# Patient Record
Sex: Female | Born: 1960 | Race: White | Hispanic: No | Smoking: Never smoker
Health system: Southern US, Community
[De-identification: ages and names within clinical notes are randomized; demographics above are authoritative.]

## PROBLEM LIST (undated history)

## (undated) DIAGNOSIS — J90 Pleural effusion, not elsewhere classified: Secondary | ICD-10-CM

## (undated) DIAGNOSIS — F329 Major depressive disorder, single episode, unspecified: Secondary | ICD-10-CM

## (undated) DIAGNOSIS — J45909 Unspecified asthma, uncomplicated: Secondary | ICD-10-CM

## (undated) DIAGNOSIS — F32A Depression, unspecified: Secondary | ICD-10-CM

## (undated) DIAGNOSIS — C801 Malignant (primary) neoplasm, unspecified: Secondary | ICD-10-CM

## (undated) DIAGNOSIS — J948 Other specified pleural conditions: Secondary | ICD-10-CM

## (undated) DIAGNOSIS — R0600 Dyspnea, unspecified: Secondary | ICD-10-CM

## (undated) HISTORY — DX: Pleural effusion, not elsewhere classified: J90

## (undated) HISTORY — DX: Other specified pleural conditions: J94.8

---

## 1982-08-26 HISTORY — PX: GUM SURGERY: SHX658

## 1998-10-25 ENCOUNTER — Other Ambulatory Visit: Admission: RE | Admit: 1998-10-25 | Discharge: 1998-10-25 | Payer: Self-pay | Admitting: Obstetrics and Gynecology

## 1998-10-26 ENCOUNTER — Other Ambulatory Visit: Admission: RE | Admit: 1998-10-26 | Discharge: 1998-10-26 | Payer: Self-pay | Admitting: Obstetrics and Gynecology

## 1999-05-16 ENCOUNTER — Other Ambulatory Visit: Admission: RE | Admit: 1999-05-16 | Discharge: 1999-05-16 | Payer: Self-pay | Admitting: Obstetrics and Gynecology

## 1999-10-02 ENCOUNTER — Other Ambulatory Visit: Admission: RE | Admit: 1999-10-02 | Discharge: 1999-10-02 | Payer: Self-pay | Admitting: Obstetrics and Gynecology

## 2000-10-29 ENCOUNTER — Other Ambulatory Visit: Admission: RE | Admit: 2000-10-29 | Discharge: 2000-10-29 | Payer: Self-pay | Admitting: Obstetrics and Gynecology

## 2001-12-08 ENCOUNTER — Other Ambulatory Visit: Admission: RE | Admit: 2001-12-08 | Discharge: 2001-12-08 | Payer: Self-pay | Admitting: Obstetrics and Gynecology

## 2002-12-27 ENCOUNTER — Other Ambulatory Visit: Admission: RE | Admit: 2002-12-27 | Discharge: 2002-12-27 | Payer: Self-pay | Admitting: Obstetrics and Gynecology

## 2004-02-13 ENCOUNTER — Other Ambulatory Visit: Admission: RE | Admit: 2004-02-13 | Discharge: 2004-02-13 | Payer: Self-pay | Admitting: Obstetrics and Gynecology

## 2005-03-07 ENCOUNTER — Other Ambulatory Visit: Admission: RE | Admit: 2005-03-07 | Discharge: 2005-03-07 | Payer: Self-pay | Admitting: Obstetrics and Gynecology

## 2005-07-09 ENCOUNTER — Ambulatory Visit: Payer: Self-pay | Admitting: Internal Medicine

## 2006-07-07 ENCOUNTER — Ambulatory Visit: Payer: Self-pay | Admitting: Internal Medicine

## 2008-01-08 ENCOUNTER — Ambulatory Visit: Payer: Self-pay | Admitting: Cardiology

## 2008-01-20 ENCOUNTER — Encounter: Payer: Self-pay | Admitting: Cardiology

## 2008-01-20 ENCOUNTER — Ambulatory Visit: Payer: Self-pay | Admitting: Cardiology

## 2008-01-20 ENCOUNTER — Ambulatory Visit: Payer: Self-pay

## 2008-02-05 ENCOUNTER — Ambulatory Visit: Payer: Self-pay | Admitting: Cardiology

## 2008-06-01 ENCOUNTER — Encounter: Admission: RE | Admit: 2008-06-01 | Discharge: 2008-06-01 | Payer: Self-pay | Admitting: Family Medicine

## 2011-01-08 NOTE — Assessment & Plan Note (Signed)
[facility]                            CARDIOLOGY OFFICE NOTE   Brenda Patel, Brenda Patel                      MRN:          [id number]  DATE:01/08/2008                            DOB:          1960-12-02    REFERRING PHYSICIAN:  Dorisann Patel, M.D.   PRIMARY CARE PHYSICIAN:  Brenda Patel, M.D.   REASON FOR PRESENTATION:  Patient with palpitation.   HISTORY OF PRESENT ILLNESS:  The patient is a pleasant 50 year old with  palpitations.  She reports that these have been occurring for 4 to 5  years but they were rare.  However, since February,  she has had these  with increasing frequency.  She has them 4 to 5 times per hour now.  She  notices them daily.  She describes skipped beats.  She does not describe  sustained tachy palpitations.  They are not associated with presyncope  or syncope; however, they are uncomfortable.  She does not notice them  when she is active.  She notices them more when she is at rest.  She has  had at least one episode at night that was quite frequent and kept her  up.  She cannot associate them with anything in particular.  She does  not have associated nausea, vomiting, or diaphoresis.  She does not have  any associated shortness of breath.  She denies any PND or orthopnea.  She does walk for exercise.  She did see Dr. Talmage Nap who has seen her for  hyperparathyroidism.  She said she had blood work done and this was  reported as normal, though I do not have a report of this.  She does  drink caffeine, about 3 caffeinated teas a day.   PAST MEDICAL HISTORY:  1. Hyperparathyroidism.  2. Osteopenia.   PAST SURGICAL HISTORY:  None.   ALLERGIES:  She is intolerant of CODEINE.   MEDICATIONS:  Allegra, Nefazodone, Kariva, fluticasone, magnesium.   SOCIAL HISTORY:  The patient is a Comptroller.  She is divorced.  She has  no children.  She does not smoke cigarettes.  She drinks alcohol  socially.   FAMILY HISTORY:   Noncontributory for early coronary artery disease,  sudden cardiac death, syncope, cardiomyopathy.   REVIEW OF SYSTEMS:  Positive for headaches, irritable bowel syndrome.  Negative for other systems.   PHYSICAL EXAMINATION:  GENERAL:  The patient is in no distress.  VITAL SIGNS:  Blood pressure 143/85, heart rate 100 and regular, weight  149 pounds, body mass index 24.  HEENT:  Eyes are unremarkable, pupils equal, round, and reactive to  light, fundi not visualized, oral mucosa unremarkable.  NECK:  No jugular venous distention at 45 degrees, carotid upstroke  brisk and symmetric, no bruits, no thyromegaly.  LYMPHATICS:  No cervical, axillary, or inguinal adenopathy.  LUNGS:  Clear to auscultation bilaterally.  BACK:  No costovertebral angle tenderness.  CHEST:  Unremarkable.  HEART:  PMI not displaced or sustained, S1 and S2 within normal limits  with no S3, no S4, no clicks,  no rubs,  no murmurs.  ABDOMEN:  Obese, positive bowel  sounds, normal in frequency and pitch,  no bruits, no rebound, no guarding, no midline pulsatile mass.  No  hepatomegaly, no splenomegaly.  SKIN:  No rashes, no nodules.  EXTREMITIES:  2+ pulses throughout, no edema, no cyanosis or clubbing.  NEURO:  Oriented to person, place, and time, cranial nerves II-XII  grossly intact, motor grossly intact.   EKG:  Sinus rhythm, rate 85, axis within normal limits, intervals within  normal limits, no acute ST-wave changes.   ASSESSMENT/PLAN:  1. Palpitations.  The patient has frequent palpitations.  They sound      like premature atrial or ventricular contractions rather than any      sustained tachy arrhythmia.  I am going to check with Dr. Talmage Nap as      I am sure a TSH was drawn.  We will also see if a magnesium and      BMET were drawn.  I am going to order an echocardiogram to make      sure she has a structurally normal heart.  She will have a 24-hour      monitor which will most likely pick up these  palpitations since      they are happening daily. The first therapy will be to quit      caffeine and I discussed this with her at length.  If this does not      work and depending on the rhythm that is diagnosed, I will most      likely prescribe a calcium channel blocker or a beta blocker.  2. Hypertension:  Blood pressure is slightly elevated today but she      says this is unusual.  We will check it the next time she is here      and she may need to get a blood pressure cuff to keep a home diary.  3. Followup:  I will see the patient back in a couple of weeks after      her echo and Holter monitor.     Rollene Rotunda, MD, [facility]  Electronically Signed    JH/MedQ  DD: 01/08/2008  DT: 01/08/2008  Job #: [id number]   cc:   Brenda Patel, M.D.  Brenda Patel, M.D.

## 2011-01-08 NOTE — Assessment & Plan Note (Signed)
[facility]                            CARDIOLOGY OFFICE NOTE   Brenda, Patel                      MRN:          [id number]  DATE:02/05/2008                            DOB:          1960/11/04    The primary is a Dr. Gerri Spore.   REASON FOR PRESENTATION:  Evaluate patient with palpitation.   HISTORY OF PRESENT ILLNESS:  The patient is a 50 year old white female  with a history of palpitations as extensively described in a Jan 08, 2008 note.  Since that time, she had labs demonstrating normal  electrolytes.  There was a Holter monitor demonstrating sinus rhythm,  occasional sinus tachycardia, rare PVCs and PACs.  Her echocardiogram  was normal.  She had a well-preserved ejection fraction and no valvular  abnormalities.  She reports that following this appointment she had  significant improvement in her palpitations.  She would occasionally get  some skipped beats.  She said things calmed down at work.  She now is  having a little more stress and a few more palpitations.  However, they  are not particularly problematic.  There is no presyncope or syncope.  She had no chest pain or shortness of breath.   PAST MEDICAL HISTORY:  Hyperparathyroidism, osteopenia.   ALLERGIES:  SHE IS INTOLERANT TO CODEINE.   MEDICATIONS:  1. Serzone 200 mg b.i.d.  2. Allegra.  3. Flonase.  4. Birth control pill.   REVIEW OF SYSTEMS:  As stated in HPI and otherwise negative for other  systems.   PHYSICAL EXAMINATION:  The patient is in no distress.  Blood pressure  128/82, rate 99 and regular, weight 148 pounds, body mass index 26.  HEENT:  Eye lids are unremarkable; pupils equal, round and reactive to  light; fundi not visualized.  NECK:  No jugular venous distention at 45 degrees; carotid upstroke  brisk and symmetrical.  No thyromegaly.  LUNGS:  Clear to auscultation bilaterally.  HEART:  PMI not displaced or sustained, S1-S2 within normals, no S3,  no  S4, no clicks, no rubs, no murmurs.  ABDOMEN:  Flat, positive bowel sounds, normal in frequency and pitch, no  bruits, no rebound, no guarding, no midline pulsatile mass, no  organomegaly.  EXTREMITIES:  2+ pulse, no edema.   ASSESSMENT/PLAN:  Palpitations.  Patient is no longer bothered by these  as much.  We have done the workup to include labs and the echo and the  event monitor.  Without any has significant findings, I suspect that  this is related to the [facility] and PVCs we noted.  She is going to treat  these symptomatically.  For now, she does not want a calcium blocker or  beta blocker but will let me know if they get worse.  No further  cardiovascular testing is suggested.   FOLLOW-UP:  She can come back to this clinic as needed.     Rollene Rotunda, MD, [facility]  Electronically Signed    JH/MedQ  DD: 02/05/2008  DT: 02/05/2008  Job #: [id number]   cc:   Otilio Connors. Gerri Spore, M.D.

## 2011-01-11 NOTE — Assessment & Plan Note (Signed)
[facility]                               PULMONARY OFFICE NOTE   DARLINA, MCCAUGHEY                      MRN:          [id number]  DATE:07/07/2006                            DOB:          1960/12/07    PULMONARY/ALLERGY FOLLOWUP   PROBLEM:  1. Allergic rhinitis.  2. Asthma.  3. Allergic conjunctivitis.   HISTORY:  One year follow-up. At times, she feels a little over dried and is  using a saline nasal gel. Very rare need for albuterol. There has been  nothing purulent and no significant seasonal flares. 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 She had stopped allergy vaccines successfully some years ago.   MEDICATIONS:  1. Serzone 200 mg b.i.d.  2. Allegra D 60 mg b.i.d. p.r.n.  3. Flonase.  4. Mircette.  5. Albuterol rescue inhaler.  6. Cromolyn 4% eye drops.   DRUG INTOLERANCES:  CODEINE.   OBJECTIVE:  Weight: 145 pounds.  Blood pressure: 120/80.  Pulse: Regular,  82.  Room air saturation: 100%.  There is fairly minor erythema and watery  mucus in the nares bilaterally.  Conjunctivae are clear. Oropharynx is  clear. Lung sounds are normal. Heart sounds are regular without murmur, rub  or gallop.   IMPRESSION:  Allergic rhinitis, adequately controlled. No recent asthma or  conjunctivitis symptoms.   PLAN:  She will follow with her primary physician for routine refills. I  will be happy to see her again if needed.     Clinton D. Maple Hudson, MD, [facility], FACP  Electronically Signed    CDY/MedQ  DD: 07/07/2006  DT: 07/07/2006  Job #: [id number]   cc:   Otilio Connors. Gerri Spore, M.D.

## 2014-05-05 ENCOUNTER — Ambulatory Visit (INDEPENDENT_AMBULATORY_CARE_PROVIDER_SITE_OTHER): Payer: BC Managed Care – PPO | Admitting: Licensed Clinical Social Worker

## 2014-05-05 DIAGNOSIS — F331 Major depressive disorder, recurrent, moderate: Secondary | ICD-10-CM

## 2014-05-26 ENCOUNTER — Ambulatory Visit (INDEPENDENT_AMBULATORY_CARE_PROVIDER_SITE_OTHER): Payer: BC Managed Care – PPO | Admitting: Licensed Clinical Social Worker

## 2014-05-26 DIAGNOSIS — F332 Major depressive disorder, recurrent severe without psychotic features: Secondary | ICD-10-CM

## 2014-06-16 ENCOUNTER — Ambulatory Visit (INDEPENDENT_AMBULATORY_CARE_PROVIDER_SITE_OTHER): Payer: BC Managed Care – PPO | Admitting: Licensed Clinical Social Worker

## 2014-06-16 DIAGNOSIS — F332 Major depressive disorder, recurrent severe without psychotic features: Secondary | ICD-10-CM

## 2014-06-23 ENCOUNTER — Ambulatory Visit (INDEPENDENT_AMBULATORY_CARE_PROVIDER_SITE_OTHER): Payer: BC Managed Care – PPO | Admitting: Licensed Clinical Social Worker

## 2014-06-23 DIAGNOSIS — F332 Major depressive disorder, recurrent severe without psychotic features: Secondary | ICD-10-CM

## 2014-07-07 ENCOUNTER — Ambulatory Visit (INDEPENDENT_AMBULATORY_CARE_PROVIDER_SITE_OTHER): Payer: BC Managed Care – PPO | Admitting: Licensed Clinical Social Worker

## 2014-07-07 DIAGNOSIS — F332 Major depressive disorder, recurrent severe without psychotic features: Secondary | ICD-10-CM

## 2015-08-27 HISTORY — PX: COLONOSCOPY: SHX174

## 2015-10-06 ENCOUNTER — Other Ambulatory Visit (HOSPITAL_COMMUNITY)
Admission: RE | Admit: 2015-10-06 | Discharge: 2015-10-06 | Disposition: A | Discharge status: Home / Self Care | Payer: BC Managed Care – PPO | Source: Ambulatory Visit | Attending: Family Medicine | Admitting: Family Medicine

## 2015-10-06 ENCOUNTER — Other Ambulatory Visit: Payer: Self-pay | Admitting: Family Medicine

## 2015-10-06 DIAGNOSIS — Z1151 Encounter for screening for human papillomavirus (HPV): Secondary | ICD-10-CM | POA: Diagnosis not present

## 2015-10-06 DIAGNOSIS — Z01411 Encounter for gynecological examination (general) (routine) with abnormal findings: Secondary | ICD-10-CM | POA: Insufficient documentation

## 2015-10-09 LAB — CYTOLOGY - PAP

## 2016-01-01 ENCOUNTER — Other Ambulatory Visit: Payer: Self-pay | Admitting: Gastroenterology

## 2016-10-23 ENCOUNTER — Telehealth (HOSPITAL_COMMUNITY): Payer: Self-pay | Admitting: Family Medicine

## 2016-10-24 ENCOUNTER — Other Ambulatory Visit: Payer: Self-pay | Admitting: Family Medicine

## 2016-10-24 DIAGNOSIS — Q796 Ehlers-Danlos syndrome, unspecified: Secondary | ICD-10-CM

## 2016-10-29 ENCOUNTER — Other Ambulatory Visit: Payer: Self-pay | Admitting: Family Medicine

## 2016-10-29 DIAGNOSIS — R002 Palpitations: Secondary | ICD-10-CM

## 2016-10-29 DIAGNOSIS — Q796 Ehlers-Danlos syndrome, unspecified: Secondary | ICD-10-CM

## 2016-11-05 ENCOUNTER — Other Ambulatory Visit: Payer: BC Managed Care – PPO

## 2016-11-06 NOTE — Telephone Encounter (Signed)
  10/23/2016 11:08 AM Phone (Outgoing) Brenda, Maultsby Patel (Self) [phone] (M)   Left Message - Phone went straight to VM .Marland Kitchenlmsg for her to CB..to schedule echo    By Verdene Rio    10/25/2016 10:40 AM Phone ([address] Brenda, Patel (Self) [phone] (M)   Left Message - Called pt and lmsg for her to CB..    By Verdene Rio

## 2016-11-08 ENCOUNTER — Ambulatory Visit
Admission: RE | Admit: 2016-11-08 | Discharge: 2016-11-08 | Disposition: A | Discharge status: Home / Self Care | Payer: BC Managed Care – PPO | Source: Ambulatory Visit | Attending: Family Medicine | Admitting: Family Medicine

## 2016-11-08 DIAGNOSIS — Q796 Ehlers-Danlos syndrome, unspecified: Secondary | ICD-10-CM

## 2016-11-12 ENCOUNTER — Ambulatory Visit (HOSPITAL_COMMUNITY): Payer: BC Managed Care – PPO | Attending: Cardiology

## 2016-11-12 ENCOUNTER — Encounter (INDEPENDENT_AMBULATORY_CARE_PROVIDER_SITE_OTHER): Payer: Self-pay

## 2016-11-12 ENCOUNTER — Other Ambulatory Visit: Payer: Self-pay

## 2016-11-12 DIAGNOSIS — R002 Palpitations: Secondary | ICD-10-CM

## 2016-11-12 DIAGNOSIS — Q796 Ehlers-Danlos syndrome, unspecified: Secondary | ICD-10-CM

## 2018-06-26 DIAGNOSIS — C801 Malignant (primary) neoplasm, unspecified: Secondary | ICD-10-CM

## 2018-06-26 HISTORY — DX: Malignant (primary) neoplasm, unspecified: C80.1

## 2018-07-03 ENCOUNTER — Other Ambulatory Visit: Payer: Self-pay

## 2018-07-03 ENCOUNTER — Emergency Department (HOSPITAL_COMMUNITY): Payer: BC Managed Care – PPO

## 2018-07-03 ENCOUNTER — Encounter (HOSPITAL_COMMUNITY): Payer: Self-pay | Admitting: Internal Medicine

## 2018-07-03 ENCOUNTER — Other Ambulatory Visit: Payer: Self-pay | Admitting: Family Medicine

## 2018-07-03 ENCOUNTER — Observation Stay (HOSPITAL_COMMUNITY)
Admission: EM | Admit: 2018-07-03 | Discharge: 2018-07-04 | Disposition: A | Discharge status: Home / Self Care | Payer: BC Managed Care – PPO | Attending: Internal Medicine | Admitting: Internal Medicine

## 2018-07-03 ENCOUNTER — Ambulatory Visit
Admission: RE | Admit: 2018-07-03 | Discharge: 2018-07-03 | Disposition: A | Discharge status: Home / Self Care | Payer: BC Managed Care – PPO | Source: Ambulatory Visit | Attending: Family Medicine | Admitting: Family Medicine

## 2018-07-03 DIAGNOSIS — J45909 Unspecified asthma, uncomplicated: Secondary | ICD-10-CM | POA: Diagnosis not present

## 2018-07-03 DIAGNOSIS — J302 Other seasonal allergic rhinitis: Secondary | ICD-10-CM

## 2018-07-03 DIAGNOSIS — R222 Localized swelling, mass and lump, trunk: Secondary | ICD-10-CM | POA: Diagnosis present

## 2018-07-03 DIAGNOSIS — D649 Anemia, unspecified: Secondary | ICD-10-CM

## 2018-07-03 DIAGNOSIS — Z7951 Long term (current) use of inhaled steroids: Secondary | ICD-10-CM | POA: Diagnosis not present

## 2018-07-03 DIAGNOSIS — R7989 Other specified abnormal findings of blood chemistry: Secondary | ICD-10-CM | POA: Diagnosis not present

## 2018-07-03 DIAGNOSIS — R000 Tachycardia, unspecified: Secondary | ICD-10-CM | POA: Insufficient documentation

## 2018-07-03 DIAGNOSIS — J948 Other specified pleural conditions: Secondary | ICD-10-CM | POA: Insufficient documentation

## 2018-07-03 DIAGNOSIS — F329 Major depressive disorder, single episode, unspecified: Secondary | ICD-10-CM | POA: Diagnosis not present

## 2018-07-03 DIAGNOSIS — Z79899 Other long term (current) drug therapy: Secondary | ICD-10-CM | POA: Diagnosis not present

## 2018-07-03 DIAGNOSIS — D509 Iron deficiency anemia, unspecified: Secondary | ICD-10-CM | POA: Diagnosis not present

## 2018-07-03 DIAGNOSIS — J90 Pleural effusion, not elsewhere classified: Secondary | ICD-10-CM | POA: Diagnosis present

## 2018-07-03 DIAGNOSIS — R918 Other nonspecific abnormal finding of lung field: Secondary | ICD-10-CM | POA: Diagnosis not present

## 2018-07-03 DIAGNOSIS — D75839 Thrombocytosis, unspecified: Secondary | ICD-10-CM

## 2018-07-03 DIAGNOSIS — D473 Essential (hemorrhagic) thrombocythemia: Secondary | ICD-10-CM

## 2018-07-03 DIAGNOSIS — Z885 Allergy status to narcotic agent status: Secondary | ICD-10-CM | POA: Insufficient documentation

## 2018-07-03 DIAGNOSIS — R0602 Shortness of breath: Secondary | ICD-10-CM

## 2018-07-03 HISTORY — PX: IR THORACENTESIS ASP PLEURAL SPACE W/IMG GUIDE: IMG5380

## 2018-07-03 HISTORY — DX: Major depressive disorder, single episode, unspecified: F32.9

## 2018-07-03 HISTORY — DX: Depression, unspecified: F32.A

## 2018-07-03 LAB — GLUCOSE, PLEURAL OR PERITONEAL FLUID: GLUCOSE FL: 109 mg/dL

## 2018-07-03 LAB — CBC WITH DIFFERENTIAL/PLATELET
Abs Immature Granulocytes: 0.04 10*3/uL (ref 0.00–0.07)
Basophils Absolute: 0.1 10*3/uL (ref 0.0–0.1)
Basophils Relative: 0 %
Eosinophils Absolute: 0.1 10*3/uL (ref 0.0–0.5)
Eosinophils Relative: 0 %
HCT: 37.4 % (ref 36.0–46.0)
Hemoglobin: 11 g/dL — ABNORMAL LOW (ref 12.0–15.0)
Immature Granulocytes: 0 %
Lymphocytes Relative: 8 %
Lymphs Abs: 1.2 10*3/uL (ref 0.7–4.0)
MCH: 24.9 pg — ABNORMAL LOW (ref 26.0–34.0)
MCHC: 29.4 g/dL — ABNORMAL LOW (ref 30.0–36.0)
MCV: 84.8 fL (ref 80.0–100.0)
Monocytes Absolute: 0.7 10*3/uL (ref 0.1–1.0)
Monocytes Relative: 5 %
Neutro Abs: 12.4 10*3/uL — ABNORMAL HIGH (ref 1.7–7.7)
Neutrophils Relative %: 87 %
Platelets: 546 10*3/uL — ABNORMAL HIGH (ref 150–400)
RBC: 4.41 MIL/uL (ref 3.87–5.11)
RDW: 13.9 % (ref 11.5–15.5)
WBC: 14.5 10*3/uL — ABNORMAL HIGH (ref 4.0–10.5)
nRBC: 0 % (ref 0.0–0.2)

## 2018-07-03 LAB — BODY FLUID CELL COUNT WITH DIFFERENTIAL
Eos, Fluid: 1 %
Lymphs, Fluid: 27 %
Monocyte-Macrophage-Serous Fluid: 68 % (ref 50–90)
Neutrophil Count, Fluid: 4 % (ref 0–25)
Total Nucleated Cell Count, Fluid: 350 cu mm (ref 0–1000)

## 2018-07-03 LAB — GRAM STAIN

## 2018-07-03 LAB — LACTATE DEHYDROGENASE: LDH: 200 U/L — ABNORMAL HIGH (ref 98–192)

## 2018-07-03 LAB — BASIC METABOLIC PANEL
Anion gap: 9 (ref 5–15)
BUN: 10 mg/dL (ref 6–20)
CO2: 27 mmol/L (ref 22–32)
Calcium: 9 mg/dL (ref 8.9–10.3)
Chloride: 98 mmol/L (ref 98–111)
Creatinine, Ser: 0.71 mg/dL (ref 0.44–1.00)
GFR calc Af Amer: >60 mL/min (ref >60)
GFR calc non Af Amer: >60 mL/min (ref >60)
Glucose, Bld: 108 mg/dL — ABNORMAL HIGH (ref 70–99)
Potassium: 3.8 mmol/L (ref 3.5–5.1)
Sodium: 134 mmol/L — ABNORMAL LOW (ref 135–145)

## 2018-07-03 LAB — HEPATIC FUNCTION PANEL
ALBUMIN: 3.2 g/dL — AB (ref 3.5–5.0)
ALT: 18 U/L (ref 0–44)
AST: 27 U/L (ref 15–41)
Alkaline Phosphatase: 105 U/L (ref 38–126)
Bilirubin, Direct: <0.1 mg/dL (ref 0.0–0.2)
TOTAL PROTEIN: 7.4 g/dL (ref 6.5–8.1)
Total Bilirubin: 0.4 mg/dL (ref 0.3–1.2)

## 2018-07-03 LAB — PROTEIN, PLEURAL OR PERITONEAL FLUID: Total protein, fluid: 4.7 g/dL

## 2018-07-03 LAB — LACTATE DEHYDROGENASE, PLEURAL OR PERITONEAL FLUID: LD, Fluid: 234 U/L — ABNORMAL HIGH (ref 3–23)

## 2018-07-03 MED ORDER — PSEUDOEPHEDRINE HCL ER 120 MG PO TB12
120.0000 mg | ORAL_TABLET | Freq: Two times a day (BID) | ORAL | Status: DC
  Administered 2018-07-03: 120 mg via ORAL
  Filled 2018-07-03 (×2): qty 1

## 2018-07-03 MED ORDER — FLUTICASONE PROPIONATE 50 MCG/ACT NA SUSP
1.0000 | Freq: Every day | NASAL | Status: DC | PRN
  Filled 2018-07-03: qty 16

## 2018-07-03 MED ORDER — MONTELUKAST SODIUM 10 MG PO TABS
10.0000 mg | ORAL_TABLET | Freq: Every day | ORAL | Status: DC
  Administered 2018-07-03: 10 mg via ORAL
  Filled 2018-07-03: qty 1

## 2018-07-03 MED ORDER — BENZONATATE 100 MG PO CAPS: 200.0000 mg | ORAL_CAPSULE | Freq: Three times a day (TID) | ORAL | Status: DC | PRN

## 2018-07-03 MED ORDER — LIDOCAINE HCL 1 % IJ SOLN
INTRAMUSCULAR | Status: AC
  Filled 2018-07-03: qty 20

## 2018-07-03 MED ORDER — ACETAMINOPHEN 325 MG PO TABS: 650.0000 mg | ORAL_TABLET | Freq: Four times a day (QID) | ORAL | Status: DC | PRN

## 2018-07-03 MED ORDER — IOHEXOL 300 MG/ML  SOLN
75.0000 mL | Freq: Once | INTRAMUSCULAR | Status: AC | PRN
  Administered 2018-07-03: 75 mL via INTRAVENOUS

## 2018-07-03 MED ORDER — NEFAZODONE HCL 100 MG PO TABS
100.0000 mg | ORAL_TABLET | Freq: Two times a day (BID) | ORAL | Status: DC
  Administered 2018-07-03 – 2018-07-04 (×2): 100 mg via ORAL
  Filled 2018-07-03 (×2): qty 1

## 2018-07-03 MED ORDER — HEPARIN SODIUM (PORCINE) 5000 UNIT/ML IJ SOLN
5000.0000 [IU] | Freq: Three times a day (TID) | INTRAMUSCULAR | Status: DC
  Administered 2018-07-03 – 2018-07-04 (×2): 5000 [IU] via SUBCUTANEOUS
  Filled 2018-07-03 (×2): qty 1

## 2018-07-03 MED ORDER — LORATADINE 10 MG PO TABS
10.0000 mg | ORAL_TABLET | Freq: Every day | ORAL | Status: DC
  Administered 2018-07-04: 10 mg via ORAL
  Filled 2018-07-03: qty 1

## 2018-07-03 MED ORDER — CALCIUM CITRATE 950 (200 CA) MG PO TABS
950.0000 mg | ORAL_TABLET | Freq: Every day | ORAL | Status: DC
  Administered 2018-07-04: 950 mg via ORAL
  Filled 2018-07-03: qty 1

## 2018-07-03 NOTE — ED Provider Notes (Signed)
4:47 PM handoff from [facility] PA-C at 4 PM.  Pending CT chest.  Patient has had thoracentesis with 1.5 L of cloudy drainage.  This was sent for testing.  Results are pending.  7:20 PM CT reviewed by myself.  Patient updated on results.  In the hallway, heart rate in the low 110s with oxygen level ranging between 92 to 94% on room air.  Patient will be admitted to the hospital for evaluation of thoracic mass.  Will attempt to speak with IR as well as hospitalist.   BP 138/77 (BP Location: Left Arm)   Pulse (!) 108   Temp 99.5 F (37.5 C) (Oral)   Resp 16   SpO2 95%   Patient is additional history of chronic cough that started approximately September.  She thought that she was having a reaction from eyedrops that she was using after several surgeries for cataracts.  She also reports a full sensation in early satiety that is been occurring for several months prior to this.  7:32 PM Spoke with Dr. Marlowe Sax who will admit.   Spoke with Dr. Geroge Baseman of IR. Plan is to await cytology result and determine if biopsy is needed.       Carlisle Cater, PA-C 07/03/18 1943    Valarie Merino, MD 07/04/18 [phone]

## 2018-07-03 NOTE — ED Provider Notes (Signed)
[place] EMERGENCY DEPARTMENT Provider Note   CSN: [id number] Arrival date & time: 07/03/18  1325     History   Chief Complaint Chief Complaint  Patient presents with  . Pleural Effusion    HPI GABRIELLAH RABEL is a 57 y.o. female with a hx of depression and seasonal allergies who presents to the ED at the request of her PCP due to pleural effusion identified on outpatient CXR earlier today.  Patient states that for the past 1.5 months she has been experiencing progressively worsening dry cough, dyspnea with activity, and generalized fatigue.  States that other than activity there are no specific alleviating or aggravating factors.  She was seen by an urgent care facility 5 days prior and started on azithromycin and Tessalon.  She has been taking this as prescribed without relief.  She called her primary care provider who ordered an x-ray-had this performed today and it revealed a right-sided pleural effusion.  She was redirected to the emergency department for further evaluation regarding this diagnosis.  Denies fever, chills, nausea, vomiting, chest pain, abdominal pain, leg pain, or leg swelling.  Denies recent foreign travel.  Denies history of tobacco abuse.  HPI  No past medical history on file.  There are no active problems to display for this patient.   Past Surgical History:  Procedure Laterality Date  . IR THORACENTESIS ASP PLEURAL SPACE W/IMG GUIDE  07/03/2018     OB History   None      Home Medications    Prior to Admission medications   Not on File    Family History No family history on file.  Social History Social History   Tobacco Use  . Smoking status: Not on file  Substance Use Topics  . Alcohol use: Not on file  . Drug use: Not on file     Allergies   Patient has no allergy information on record.   Review of Systems Review of Systems  Constitutional: Positive for fatigue. Negative for chills and fever.  Respiratory:  Positive for cough and shortness of breath. Negative for wheezing.   Cardiovascular: Negative for chest pain, palpitations and leg swelling.  Gastrointestinal: Negative for abdominal pain, nausea and vomiting.  All other systems reviewed and are negative.    Physical Exam Updated Vital Signs BP 138/84   Pulse (!) 108   Temp 99.5 F (37.5 C) (Oral)   Resp 17   SpO2 95%   Physical Exam  Constitutional: She appears well-developed and well-nourished.  Non-toxic appearance. No distress.  HENT:  Head: Normocephalic and atraumatic.  Eyes: Conjunctivae are normal. Right eye exhibits no discharge. Left eye exhibits no discharge.  Neck: Neck supple.  Cardiovascular: Regular rhythm. Tachycardia present.  Pulmonary/Chest: Effort normal. No respiratory distress. She has decreased breath sounds (mid to lower right lung field). She has no wheezes. She has no rhonchi. She has no rales.  Respiration even and unlabored  Abdominal: Soft. She exhibits no distension. There is no tenderness.  Musculoskeletal:       Right lower leg: She exhibits no tenderness and no edema.       Left lower leg: She exhibits no tenderness and no edema.  Neurological: She is alert.  Clear speech.   Skin: Skin is warm and dry. No rash noted.  Psychiatric: She has a normal mood and affect. Her behavior is normal.  Nursing note and vitals reviewed.   ED Treatments / Results  Labs (all labs ordered are listed,  but only abnormal results are displayed) Labs Reviewed - No data to display  EKG None  Radiology Dg Chest 2 View  Result Date: 07/03/2018 CLINICAL DATA:  Short of breath and cough for 2 weeks. History of asthma. EXAM: CHEST - 2 VIEW COMPARISON:  None. FINDINGS: A large pleural effusion occupies at least 60% of the right hemithorax. Cannot exclude underlying pneumonia. Possible irregular pleural thickening versus fluid at the right apex. Left lung is hyperexpanded but clear. No left pleural effusion. No  pneumothorax on either side. Cardiac silhouette is normal in size. No mediastinal or left hilar masses. Right hilum is obscured by the contiguous pleural effusion. Skeletal structures are intact. IMPRESSION: 1. Large right pleural effusion. There is a possible underlying pneumonia versus atelectasis. Cannot exclude an underlying mass. Recommend follow-up chest CT with contrast for further assessment. 2. No evidence of pulmonary edema. Electronically Signed   By: Lajean Manes M.D.   On: 07/03/2018 13:04    Procedures Procedures (including critical care time)  Medications Ordered in ED Medications - No data to display   Initial Impression / Assessment and Plan / ED Course  I have reviewed the triage vital signs and the nursing notes.  Pertinent labs & imaging results that were available during my care of the patient were reviewed by me and considered in my medical decision making (see chart for details).   Patient presents to the emergency department due to right-sided pleural effusion confirmed on outpatient chest x-ray today.  She is been experiencing cough, dyspnea, and generalized fatigue for the past 1.5 months.  On exam she is afebrile, mildly tachycardic, but without signs of respiratory distress.  She has decreased breath sounds to the mid and lower right lung consistent with confirmed pleural effusion.  Discussed with supervising physician Dr. Tomi Bamberger: We will obtain a CT chest with contrast per radiologist recommendations per x-ray interpretation, basic labs, and order IR guided thoracentesis.  Patient's labs thus far have been reviewed: She has leukocytosis of 14.5 with left shift.  Hemoglobin of 11.0, no prior record for comparison.  Thrombocytosis at 546, also no prior on record for comparison.  Mild hyponatremia at 134.  Otherwise unremarkable.  Interventional radiology team has performed thoracentesis, pleural fluid analysis pending as well as CT chest with contrast.  Patient signed  out to [facility] PA-C at change of shift pending these results.  Final Clinical Impressions(s) / ED Diagnoses   Final diagnoses:  Pleural effusion    ED Discharge Orders    None       Amaryllis Dyke, PA-C 07/03/18 1751    Dorie Rank, MD 07/04/18 [phone]

## 2018-07-03 NOTE — ED Triage Notes (Signed)
Pt here from [place] with positive chest x-ray for pleural effusion. Pt reports shortness of breath for about a month that has gotten worse since Monday. Patient went to urgent care and was prescribed a zpack and tesslon for cough. VSS at this time.

## 2018-07-03 NOTE — H&P (Addendum)
History and Physical    Brenda Patel TLX:[id number] DOB: 1961-07-03 DOA: 07/03/2018  PCP: Patient, No Pcp Per Patient coming from: PCPs office  Chief Complaint: Evaluation of pleural effusion  HPI: Brenda Patel is a 57 y.o. female with medical history significant of seasonal allergies, depression presenting to the hospital at the request of her PCP for evaluation of a pleural effusion identified on outpatient chest x-ray earlier today.  Patient reports having a 2-week history of dyspnea and dry cough.  States it has been very difficult for her to breathe when laying down flat.  Reports having a temperature of 99.5 F this morning.  She is a never smoker.  ED Course: Afebrile, tachycardic, satting well on room air.  White count 14.5.  Hemoglobin 11.0, platelet count 546,000.No prior labs in the chart for comparison.  Chest x-ray showing a large right-sided pleural effusion.  IR was consulted and patient underwent thoracentesis yielding 1.5 L of pleural fluid.  Follow-up chest x-ray revealed slightly decreased right pleural effusion and no pneumothorax.  Patient subsequently had a chest CT which revealed a very large (1360 cm) heterogenously enhancing right subpulmonic mass which is thought to be possibly pleural in origin. Differential diagnostic considerations include malignant mesothelioma, lung cancer with pleural spread, other metastatic disease, pleural lymphoma, and solitary fibrous tumor of the pleura.  Review of Systems: As per HPI otherwise 10 point review of systems negative.  Past Medical History:  Diagnosis Date  . Depression     Past Surgical History:  Procedure Laterality Date  . IR THORACENTESIS ASP PLEURAL SPACE W/IMG GUIDE  07/03/2018     has no tobacco, alcohol, and drug history on file.  Patient denies tobacco and illicit drug use.  States she drinks alcohol occasionally; last time she consumed wine was several weeks ago.  Allergies  Allergen Reactions  . Codeine  Nausea Only and Other (See Comments)    Severe constipation, also  . Other Other (See Comments)    Lima beans- Tested allergic    History reviewed. No pertinent family history.  Prior to Admission medications   Medication Sig Start Date End Date Taking? Authorizing Provider  acetaminophen (TYLENOL) 500 MG tablet Take 500-1,000 mg by mouth every 6 (six) hours as needed (for pain, discomfort, or fever).   Yes [provider]  albuterol (PROAIR HFA) 108 (90 Base) MCG/ACT inhaler Inhale 2 puffs into the lungs every 6 (six) hours as needed for wheezing or shortness of breath.   Yes [provider]  azithromycin (ZITHROMAX) 250 MG tablet Take 250-500 mg by mouth See admin instructions. Take 500 mg by mouth on day one, then 250 mg once daily for 4 days 06/29/18  Yes [provider]  benzonatate (TESSALON) 200 MG capsule Take 200 mg by mouth 3 (three) times daily as needed for cough.  06/29/18  Yes [provider]  Calcium Citrate 333 MG TABS Take 333 mg by mouth daily.   Yes [provider]  [employer] 12 HOUR NASAL DECONGESTANT 120 MG 12 hr tablet Take 120 mg by mouth 2 (two) times daily. 05/31/18  Yes [provider]  loratadine (CLARITIN) 10 MG tablet Take 10 mg by mouth daily.   Yes [provider]  meloxicam (MOBIC) 15 MG tablet Take 15 mg by mouth daily as needed for pain.  05/17/18  Yes [provider]  montelukast (SINGULAIR) 10 MG tablet Take 10 mg by mouth daily after supper. 06/07/18  Yes [provider]  nefazodone (SERZONE) 100 MG tablet Take 100 mg by mouth 2 (two) times daily. 06/07/18  Yes [provider]  traMADol (ULTRAM) 50 MG tablet Take 50-100 mg by mouth every 6 (six) hours as needed (for pain). 1-2 TABLET AS NEEDED EVERY 6 HRS AS NEEDED FOR PAIN ORALLY 30 DAYS 03/21/17  Yes [provider]  fluticasone (FLONASE) 50 MCG/ACT nasal spray Place 1 spray into both nostrils daily as needed for  allergies or rhinitis.  05/05/18   [provider]    Physical Exam: Vitals:   07/03/18 1545 07/03/18 1600 07/03/18 1615 07/03/18 1903  BP: 136/80 134/81 138/84 138/77  Pulse: (!) 107 (!) 105 (!) 108 (!) 108  Resp: 13 13 17 16   Temp:      TempSrc:      SpO2: 98% 97% 95% 95%    Physical Exam  Constitutional: She is oriented to person, place, and time. She appears well-developed and well-nourished. No distress.  Resting comfortably in a hospital stretcher  HENT:  Head: Normocephalic and atraumatic.  Mouth/Throat: Oropharynx is clear and moist.  Eyes: Right eye exhibits no discharge. Left eye exhibits no discharge.  Neck: Neck supple. No tracheal deviation present.  Cardiovascular: Regular rhythm and intact distal pulses.  Tachycardic  Pulmonary/Chest: Effort normal. She has no wheezes. She has no rales.  Decreased breath sounds at the right lung base  Abdominal: Soft. Bowel sounds are normal. She exhibits no distension. There is no tenderness.  Musculoskeletal: She exhibits no edema.  Neurological: She is alert and oriented to person, place, and time.  Skin: Skin is warm and dry. She is not diaphoretic.  Psychiatric: She has a normal mood and affect. Her behavior is normal.     Labs on Admission: I have personally reviewed following labs and imaging studies  CBC: Recent Labs  Lab 07/03/18 1339  WBC 14.5*  NEUTROABS 12.4*  HGB 11.0*  HCT 37.4  MCV 84.8  PLT 761*   Basic Metabolic Panel: Recent Labs  Lab 07/03/18 1339  NA 134*  K 3.8  CL 98  CO2 27  GLUCOSE 108*  BUN 10  CREATININE 0.71  CALCIUM 9.0   GFR: CrCl cannot be calculated (Unknown ideal weight.). Liver Function Tests: No results for input(s): AST, ALT, ALKPHOS, BILITOT, PROT, ALBUMIN in the last 168 hours. No results for input(s): LIPASE, AMYLASE in the last 168 hours. No results for input(s): AMMONIA in the last 168 hours. Coagulation Profile: No results for input(s): INR, PROTIME in  the last 168 hours. Cardiac Enzymes: No results for input(s): CKTOTAL, CKMB, CKMBINDEX, TROPONINI in the last 168 hours. BNP (last 3 results) No results for input(s): PROBNP in the last 8760 hours. HbA1C: No results for input(s): HGBA1C in the last 72 hours. CBG: No results for input(s): GLUCAP in the last 168 hours. Lipid Profile: No results for input(s): CHOL, HDL, LDLCALC, TRIG, CHOLHDL, LDLDIRECT in the last 72 hours. Thyroid Function Tests: No results for input(s): TSH, T4TOTAL, FREET4, T3FREE, THYROIDAB in the last 72 hours. Anemia Panel: No results for input(s): VITAMINB12, FOLATE, FERRITIN, TIBC, IRON, RETICCTPCT in the last 72 hours. Urine analysis: No results found for: COLORURINE, APPEARANCEUR, [place], [place], [place], [place], [place], [place], [place], [place], NITRITE, LEUKOCYTESUR  Radiological Exams on Admission: Dg Chest 1 View  Result Date: 07/03/2018 CLINICAL DATA:  Status post right-sided thoracentesis. EXAM: CHEST  1 VIEW COMPARISON:  Radiograph of same day. FINDINGS: The heart size and mediastinal contours are within normal limits. Left lung is clear.  Large right pleural effusion is noted which is slightly decreased compared to prior exam. No pneumothorax is noted. The visualized skeletal structures are unremarkable. IMPRESSION: Right pleural effusion is slightly decreased compared to prior exam. No pneumothorax is noted. Electronically Signed   By: Marijo Conception, M.D.   On: 07/03/2018 15:44   Dg Chest 2 View  Result Date: 07/03/2018 CLINICAL DATA:  Short of breath and cough for 2 weeks. History of asthma. EXAM: CHEST - 2 VIEW COMPARISON:  None. FINDINGS: A large pleural effusion occupies at least 60% of the right hemithorax. Cannot exclude underlying pneumonia. Possible irregular pleural thickening versus fluid at the right apex. Left lung is hyperexpanded but clear. No left pleural effusion. No pneumothorax on either side. Cardiac silhouette is normal  in size. No mediastinal or left hilar masses. Right hilum is obscured by the contiguous pleural effusion. Skeletal structures are intact. IMPRESSION: 1. Large right pleural effusion. There is a possible underlying pneumonia versus atelectasis. Cannot exclude an underlying mass. Recommend follow-up chest CT with contrast for further assessment. 2. No evidence of pulmonary edema. Electronically Signed   By: Lajean Manes M.D.   On: 07/03/2018 13:04   Ct Chest W Contrast  Result Date: 07/03/2018 CLINICAL DATA:  Dyspnea. Thoracentesis today. Right pleural effusion. EXAM: CT CHEST WITH CONTRAST TECHNIQUE: Multidetector CT imaging of the chest was performed during intravenous contrast administration. CONTRAST:  83mL OMNIPAQUE IOHEXOL 300 MG/ML  SOLN COMPARISON:  Radiographs from 07/03/2018 FINDINGS: Cardiovascular: Unremarkable Mediastinum/Nodes: Mild deviation of the heart to the left by the large right basilar mass. The mass abuts the pleural margin and also the right anterolateral pericardial margin. No pericardial effusion. A right internal mammary node measures 0.5 cm in short axis on image 71/3. Lungs/Pleura: A right subpulmonic mass measures 16.4 by 12.2 by 13.0 cm (volume = 1360 cm^3), with the mass itself filling over half of the hemithorax. There is atelectatic right lower lobe and right middle lobe adjacent to the mass, and it is difficult to say for certain that the mass is not arising from the lung, although the subpulmonic location would favor a pleural mass. There is an associated small pleural effusion with some faint enhancement along the pleural effusion margins. The mass abuts and causes some mass effect on the right side of the heart. Upper Abdomen: I do not observe the mass to traverse the diaphragm. There is some mild indistinctness along the medial limb left adrenal gland on image 145/3 which is nonspecific, and not definitively masslike. Musculoskeletal: No rib destruction is identified. No  appreciable findings of osseous metastatic disease. IMPRESSION: 1. Very large (volume equals 1360 cubic cm) heterogeneously enhancing right subpulmonic mass. I tend to favor a pleural rather than a pulmonary parenchymal origin although this is not absolutely certain. Differential diagnostic considerations include malignant mesothelioma, lung cancer with pleural spread, other metastatic disease, pleural lymphoma, and solitary fibrous tumor of the pleura. Although I mention fibrous tumor of the pleura, the degree of heterogeneity and lobularity would be atypical for that entity. Thoracic surgical referral recommended. 2. Small to moderate right pleural effusion. Electronically Signed   By: Van Clines M.D.   On: 07/03/2018 18:53   Ir Thoracentesis Asp Pleural Space W/img Guide  Result Date: 07/03/2018 INDICATION: Shortness of breath. Large right pleural effusion noted on chest x-ray. Request diagnostic and therapeutic thoracentesis. EXAM: ULTRASOUND GUIDED RIGHT THORACENTESIS MEDICATIONS: None. COMPLICATIONS: None immediate. PROCEDURE: An ultrasound guided thoracentesis was thoroughly discussed with the patient and questions answered.  The benefits, risks, alternatives and complications were also discussed. The patient understands and wishes to proceed with the procedure. Written consent was obtained. Ultrasound was performed to localize and mark an adequate pocket of fluid in the right chest. The area was then prepped and draped in the normal sterile fashion. 1% Lidocaine was used for local anesthesia. Under ultrasound guidance a 6 Fr Safe-T-Centesis catheter was introduced. Thoracentesis was performed. The catheter was removed and a dressing applied. FINDINGS: A total of approximately 1.5 L of hazy, serosanguineous fluid was removed. Samples were sent to the laboratory as requested by the clinical team. IMPRESSION: Successful ultrasound guided right thoracentesis yielding 1.5 L of pleural fluid. Read by:  Ascencion Dike PA-C Electronically Signed   By: Jacqulynn Cadet M.D.   On: 07/03/2018 15:56    EKG: Independently reviewed.  Sinus tachycardia-heart rate 112.  Assessment/Plan Principal Problem:   Lung mass Active Problems:   Pleural effusion   Anemia   Thrombocytosis (HCC)   Seasonal allergies  Large right-sided pleural effusion in the setting of a very large right subpulmonic mass Afebrile, tachycardic, satting well on room air.  White count 14.5. Chest x-ray showing a large right-sided pleural effusion.  IR was consulted and patient underwent thoracentesis yielding 1.5 L of pleural fluid.  Follow-up chest x-ray revealed slightly decreased right pleural effusion and no pneumothorax.  Patient subsequently had a chest CT which revealed a very large (1360 cm) heterogenously enhancing right subpulmonic mass which is thought to be possibly pleural in origin. Differential diagnostic considerations include malignant mesothelioma, lung cancer with pleural spread, other metastatic disease, pleural lymphoma, and solitary fibrous tumor of the pleura. -Monitor on telemetry -Incentive spirometry -Supplemental oxygen if needed -Tessalon Perles for cough -Serum LDH 234.  Pleural fluid LDH pending. -Pleural fluid analysis showing WBC count 350, protein 4.7, glucose 109, Gram stain with no organisms seen -Pending pleural fluid analysis labs: Triglycerides, pH, culture -Pending hepatic function panel -Consult IR in the morning for biopsy of the subpulmonic mass.  Anemia Hemoglobin 11.0 with normal MCV, no prior labs in the chart for comparison.  No signs of active bleeding. -Repeat CBC in a.m. -Check iron, ferritin, TIBC  Thrombocytosis Platelet count 546,000, no prior labs in the chart for comparison.  -Repeat CBC in a.m.  Seasonal allergies -Continue home Flonase, Claritin, Singulair, Sudafed  DVT prophylaxis: Subcutaneous heparin Code Status: Patient wishes to be full code. Family  Communication: Sister at bedside. Disposition Plan: Anticipate discharge to home in 1 to 2 days. Consults called: IR Admission status: Observation   Shela Leff MD [facility] Pager [phone]  If 7PM-7AM, please contact night-coverage [email or web address] Password [email or web address]  07/03/2018, 8:09 PM

## 2018-07-03 NOTE — ED Notes (Signed)
Patient ambulatory with steady gait.

## 2018-07-03 NOTE — ED Notes (Signed)
Ambulated pt in hallway. pts O2 sats were maintained between 97-100

## 2018-07-03 NOTE — ED Notes (Signed)
Patient to IR

## 2018-07-03 NOTE — Procedures (Signed)
PROCEDURE SUMMARY:  Successful US guided right thoracentesis. Yielded 1.5 L of hazy serosanguinous fluid. Pt tolerated procedure well. No immediate complications.  Specimen was sent for labs. CXR ordered.  Ascencion Dike PA-C 07/03/2018 3:34 PM

## 2018-07-04 DIAGNOSIS — R918 Other nonspecific abnormal finding of lung field: Secondary | ICD-10-CM

## 2018-07-04 LAB — CBC
HEMATOCRIT: 34 % — AB (ref 36.0–46.0)
Hemoglobin: 10.5 g/dL — ABNORMAL LOW (ref 12.0–15.0)
MCH: 25.5 pg — ABNORMAL LOW (ref 26.0–34.0)
MCHC: 30.9 g/dL (ref 30.0–36.0)
MCV: 82.7 fL (ref 80.0–100.0)
NRBC: 0 % (ref 0.0–0.2)
Platelets: 453 10*3/uL — ABNORMAL HIGH (ref 150–400)
RBC: 4.11 MIL/uL (ref 3.87–5.11)
RDW: 13.8 % (ref 11.5–15.5)
WBC: 12.2 10*3/uL — AB (ref 4.0–10.5)

## 2018-07-04 LAB — TRIGLYCERIDES, BODY FLUIDS: Triglycerides, Fluid: 42 mg/dL

## 2018-07-04 LAB — BASIC METABOLIC PANEL
Anion gap: 9 (ref 5–15)
BUN: 8 mg/dL (ref 6–20)
CO2: 27 mmol/L (ref 22–32)
CREATININE: 0.75 mg/dL (ref 0.44–1.00)
Calcium: 8.5 mg/dL — ABNORMAL LOW (ref 8.9–10.3)
Chloride: 100 mmol/L (ref 98–111)
GFR calc Af Amer: >60 mL/min (ref >60)
GFR calc non Af Amer: >60 mL/min (ref >60)
Glucose, Bld: 110 mg/dL — ABNORMAL HIGH (ref 70–99)
Potassium: 3.7 mmol/L (ref 3.5–5.1)
SODIUM: 136 mmol/L (ref 135–145)

## 2018-07-04 LAB — IRON AND TIBC
Iron: 23 ug/dL — ABNORMAL LOW (ref 28–170)
SATURATION RATIOS: 9 % — AB (ref 10.4–31.8)
TIBC: 251 ug/dL (ref 250–450)
UIBC: 228 ug/dL

## 2018-07-04 LAB — HIV ANTIBODY (ROUTINE TESTING W REFLEX): HIV SCREEN 4TH GENERATION: NONREACTIVE

## 2018-07-04 LAB — FERRITIN: Ferritin: 111 ng/mL (ref 11–307)

## 2018-07-04 MED ORDER — PSEUDOEPHEDRINE HCL ER 120 MG PO TB12
120.0000 mg | ORAL_TABLET | Freq: Two times a day (BID) | ORAL | Status: DC
  Administered 2018-07-04: 120 mg via ORAL
  Filled 2018-07-04: qty 1

## 2018-07-04 MED ORDER — HYDROCOD POLST-CPM POLST ER 10-8 MG/5ML PO SUER: 5.0000 mL | Freq: Two times a day (BID) | ORAL | 0 refills | Status: DC | PRN

## 2018-07-04 MED ORDER — FERROUS SULFATE 325 (65 FE) MG PO TABS: 325.0000 mg | ORAL_TABLET | Freq: Every day | ORAL | Status: DC

## 2018-07-04 MED ORDER — HYDROCOD POLST-CPM POLST ER 10-8 MG/5ML PO SUER: 5.0000 mL | Freq: Two times a day (BID) | ORAL | Status: DC | PRN

## 2018-07-04 MED ORDER — FERROUS SULFATE 325 (65 FE) MG PO TABS: 325.0000 mg | ORAL_TABLET | Freq: Two times a day (BID) | ORAL | 0 refills | Status: DC

## 2018-07-04 MED ORDER — BENZONATATE 200 MG PO CAPS: 200.0000 mg | ORAL_CAPSULE | Freq: Three times a day (TID) | ORAL | 0 refills | Status: DC | PRN

## 2018-07-06 LAB — PH, BODY FLUID: pH, Body Fluid: 7.6

## 2018-07-06 LAB — PATHOLOGIST SMEAR REVIEW: Path Review: REACTIVE

## 2018-07-06 NOTE — Discharge Summary (Signed)
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 Please follow-up with PCP in 1 week  Follow-up Information    Brenda Small, MD. Go on 07/06/2018.   Specialty:  Family Medicine Contact information: [address] [place] [address] [phone]          Diet recommendation: Cardiac diet  Activity: The patient is advised to gradually reintroduce usual activities.  Discharge Condition: good  Code Status: Full code  History of present illness: As per the H and P dictated on admission, "Brenda Patel is a 57 y.o. female with medical history significant of seasonal allergies, depression presenting to the hospital at the request of her PCP for evaluation of a pleural effusion identified on outpatient chest x-ray earlier today.  Patient reports having a 2-week history of dyspnea and dry cough.  States it has been very difficult for her to breathe when laying down flat.  Reports having a temperature of 99.5 F this morning.  She is a never smoker.  ED Course: Afebrile, tachycardic, satting well on room air.  White count 14.5.  Hemoglobin 11.0, platelet count 546,000.No prior labs in the chart for comparison.  Chest x-ray showing a large right-sided pleural effusion.  IR was consulted and patient underwent thoracentesis yielding 1.5 L of pleural fluid.  Follow-up chest x-ray revealed slightly decreased right pleural effusion and no pneumothorax.  Patient subsequently had a chest CT which revealed a very large  (1360 cm) heterogenously enhancing right subpulmonic mass which is thought to be possibly pleural in origin. Differential diagnostic considerations include malignant mesothelioma, lung cancer with pleural spread, other metastatic disease, pleural lymphoma, and solitary fibrous tumor of the pleura."  Hospital Course:  Summary of her active problems in the hospital is as following. Large right-sided pleural effusion in the setting of a very large right subpulmonic mass Afebrile, tachycardic, satting well on room air.   White count 14.5.  Chest x-ray showing a large right-sided pleural effusion.   IR was consulted and patient underwent thoracentesis yielding 1.5 L of pleural fluid.   Initial work-up shows exudative pleural effusion without any evidence of active infection. Follow-up chest x-ray revealed slightly decreased right pleural effusion and no pneumothorax.  Patient subsequently had a chest CT which revealed a very large (1360 cm) heterogenously enhancing right subpulmonic mass which is thought to be possibly pleural in origin. Differential diagnostic considerations include malignant mesothelioma, lung cancer with pleural spread, other metastatic disease, pleural lymphoma, and solitary fibrous tumor of the pleura.  -Incentive spirometry -Tessalon Perles for cough -Await for cytology results from the pleural fluid analysis.  Discussed with oncology on-call, patient will still require biopsy of the mass to get genetic identification if it turns out to be malignancy.  Depending on the results of the cytology and notify the patient for further work-up.  Iron deficiency anemia Hemoglobin 11.0 with normal MCV, no prior labs in the chart for comparison.  No signs of active bleeding. No chronic bleeding reported by the patient. Will provide supplemental iron on  discharge.  Thrombocytosis Stable.  No acute abnormality.  Monitor.  Seasonal allergies -Continue home Flonase, Claritin,  Singulair, Sudafed  Sinus tachycardia. Likely associated with Sudafed, and now in addition with anxiety as well as related to her large pulmonic mass. No evidence of obstruction or PE or cardiac compromise on the CT scan. Outpatient monitoring.  All other chronic medical condition were stable during the hospitalization.  Patient was ambulatory without any assistance. On the day of the discharge the patient's vitals were stable , and no other acute medical condition were reported by patient. the patient was felt safe to be discharge at home with family.  Consultants: Interventional radiology Procedures: IR guided thoracentesis  DISCHARGE MEDICATION: Allergies as of 07/04/2018      Reactions   Codeine Nausea Only, Other (See Comments)   Severe constipation, also   Other Other (See Comments)   Lima beans- Tested allergic      Medication List    STOP taking these medications   azithromycin 250 MG tablet Commonly known as:  ZITHROMAX     TAKE these medications   acetaminophen 500 MG tablet Commonly known as:  TYLENOL Take 500-1,000 mg by mouth every 6 (six) hours as needed (for pain, discomfort, or fever).   benzonatate 200 MG capsule Commonly known as:  TESSALON Take 1 capsule (200 mg total) by mouth 3 (three) times daily as needed for cough.   Calcium Citrate 333 MG Tabs Take 333 mg by mouth daily.   chlorpheniramine-HYDROcodone 10-8 MG/5ML Suer Commonly known as:  TUSSIONEX Take 5 mLs by mouth every 12 (twelve) hours as needed for cough.   [employer] 12 HOUR NASAL DECONGESTANT 120 MG 12 hr tablet Generic drug:  pseudoephedrine Take 120 mg by mouth 2 (two) times daily.   ferrous sulfate 325 (65 FE) MG tablet Take 1 tablet (325 mg total) by mouth 2 (two) times daily with a meal.   fluticasone 50 MCG/ACT nasal spray Commonly known as:  FLONASE Place 1 spray into both nostrils daily as needed for allergies or rhinitis.   loratadine 10 MG tablet Commonly known as:   CLARITIN Take 10 mg by mouth daily.   meloxicam 15 MG tablet Commonly known as:  MOBIC Take 15 mg by mouth daily as needed for pain.   montelukast 10 MG tablet Commonly known as:  SINGULAIR Take 10 mg by mouth daily after supper.   nefazodone 100 MG tablet Commonly known as:  SERZONE Take 100 mg by mouth 2 (two) times daily.   PROAIR HFA 108 (90 Base) MCG/ACT inhaler Generic drug:  albuterol Inhale 2 puffs into the lungs every 6 (six) hours as needed for wheezing or shortness of breath.   traMADol 50 MG tablet Commonly known as:  ULTRAM Take 50-100 mg by mouth every 6 (six) hours as needed (for pain). 1-2 TABLET AS NEEDED EVERY 6 HRS AS NEEDED FOR PAIN ORALLY 30 DAYS      Allergies  Allergen Reactions  . Codeine Nausea Only and Other (See Comments)    Severe constipation, also  . Other Other (See Comments)    Lima beans- Tested allergic   Discharge Instructions    Ambulatory referral to Hematology / Oncology   Complete by:  As directed    Diet - low sodium heart healthy   Complete by:  As directed    Discharge instructions   Complete by:  As directed    It is important that you read following instructions as well as go over  your medication list with RN to help you understand your care after this hospitalization.  Discharge Instructions: Please follow-up with PCP in one week  Please request your primary care physician to go over all Hospital Tests and Procedure/Radiological results at the follow up,  Please get all Hospital records sent to your PCP by signing hospital release before you go home.   Do not take more than prescribed Pain, Sleep and Anxiety Medications. You were cared for by a hospitalist during your hospital stay. If you have any questions about your discharge medications or the care you received while you were in the hospital after you are discharged, you can call the unit you were admitted to and ask to speak with the hospitalist on call if the  hospitalist that took care of you is not available.  Once you are discharged, your primary care physician will handle any further medical issues. Please note that NO REFILLS for any discharge medications will be authorized once you are discharged, as it is imperative that you return to your primary care physician (or establish a relationship with a primary care physician if you do not have one) for your aftercare needs so that they can reassess your need for medications and monitor your lab values. You Must read complete instructions/literature along with all the possible adverse reactions/side effects for all the Medicines you take and that have been prescribed to you. Take any new Medicines after you have completely understood and accept all the possible adverse reactions/side effects. Wear Seat belts while driving. If you have smoked or chewed Tobacco in the last 2 yrs please stop smoking and/or stop any Recreational drug use.   Increase activity slowly   Complete by:  As directed      Discharge Exam: There were no vitals filed for this visit. Vitals:   07/03/18 2309 07/04/18 0801  BP: 127/70 118/73  Pulse: (!) 101 (!) 107  Resp: 16 18  Temp: 99.9 F (37.7 C) 98.6 F (37 C)  SpO2: 94% 96%   General: Appear in no distress, no Rash; Oral Mucosa moist. Cardiovascular: S1 and S2 Present, no Murmur, no JVD Respiratory: Bilateral Air entry present and Clear to Auscultation, more diminished on right, no crackles, no wheezes Abdomen: Bowel Sound present, Soft and no tenderness Extremities: no Pedal edema, n calf tenderness Neurology: Grossly no focal neuro deficit.  The results of significant diagnostics from this hospitalization (including imaging, microbiology, ancillary and laboratory) are listed below for reference.    Significant Diagnostic Studies: Dg Chest 1 View  Result Date: 07/03/2018 CLINICAL DATA:  Status post right-sided thoracentesis. EXAM: CHEST  1 VIEW COMPARISON:   Radiograph of same day. FINDINGS: The heart size and mediastinal contours are within normal limits. Left lung is clear. Large right pleural effusion is noted which is slightly decreased compared to prior exam. No pneumothorax is noted. The visualized skeletal structures are unremarkable. IMPRESSION: Right pleural effusion is slightly decreased compared to prior exam. No pneumothorax is noted. Electronically Signed   By: Marijo Conception, M.D.   On: 07/03/2018 15:44   Dg Chest 2 View  Result Date: 07/03/2018 CLINICAL DATA:  Short of breath and cough for 2 weeks. History of asthma. EXAM: CHEST - 2 VIEW COMPARISON:  None. FINDINGS: A large pleural effusion occupies at least 60% of the right hemithorax. Cannot exclude underlying pneumonia. Possible irregular pleural thickening versus fluid at the right apex. Left lung is hyperexpanded but clear. No left pleural effusion. No pneumothorax on  either side. Cardiac silhouette is normal in size. No mediastinal or left hilar masses. Right hilum is obscured by the contiguous pleural effusion. Skeletal structures are intact. IMPRESSION: 1. Large right pleural effusion. There is a possible underlying pneumonia versus atelectasis. Cannot exclude an underlying mass. Recommend follow-up chest CT with contrast for further assessment. 2. No evidence of pulmonary edema. Electronically Signed   By: Lajean Manes M.D.   On: 07/03/2018 13:04   Ct Chest W Contrast  Result Date: 07/03/2018 CLINICAL DATA:  Dyspnea. Thoracentesis today. Right pleural effusion. EXAM: CT CHEST WITH CONTRAST TECHNIQUE: Multidetector CT imaging of the chest was performed during intravenous contrast administration. CONTRAST:  86mL OMNIPAQUE IOHEXOL 300 MG/ML  SOLN COMPARISON:  Radiographs from 07/03/2018 FINDINGS: Cardiovascular: Unremarkable Mediastinum/Nodes: Mild deviation of the heart to the left by the large right basilar mass. The mass abuts the pleural margin and also the right anterolateral  pericardial margin. No pericardial effusion. A right internal mammary node measures 0.5 cm in short axis on image 71/3. Lungs/Pleura: A right subpulmonic mass measures 16.4 by 12.2 by 13.0 cm (volume = 1360 cm^3), with the mass itself filling over half of the hemithorax. There is atelectatic right lower lobe and right middle lobe adjacent to the mass, and it is difficult to say for certain that the mass is not arising from the lung, although the subpulmonic location would favor a pleural mass. There is an associated Patel pleural effusion with some faint enhancement along the pleural effusion margins. The mass abuts and causes some mass effect on the right side of the heart. Upper Abdomen: I do not observe the mass to traverse the diaphragm. There is some mild indistinctness along the medial limb left adrenal gland on image 145/3 which is nonspecific, and not definitively masslike. Musculoskeletal: No rib destruction is identified. No appreciable findings of osseous metastatic disease. IMPRESSION: 1. Very large (volume equals 1360 cubic cm) heterogeneously enhancing right subpulmonic mass. I tend to favor a pleural rather than a pulmonary parenchymal origin although this is not absolutely certain. Differential diagnostic considerations include malignant mesothelioma, lung cancer with pleural spread, other metastatic disease, pleural lymphoma, and solitary fibrous tumor of the pleura. Although I mention fibrous tumor of the pleura, the degree of heterogeneity and lobularity would be atypical for that entity. Thoracic surgical referral recommended. 2. Patel to moderate right pleural effusion. Electronically Signed   By: Van Clines M.D.   On: 07/03/2018 18:53   Ir Thoracentesis Asp Pleural Space W/img Guide  Result Date: 07/03/2018 INDICATION: Shortness of breath. Large right pleural effusion noted on chest x-ray. Request diagnostic and therapeutic thoracentesis. EXAM: ULTRASOUND GUIDED RIGHT THORACENTESIS  MEDICATIONS: None. COMPLICATIONS: None immediate. PROCEDURE: An ultrasound guided thoracentesis was thoroughly discussed with the patient and questions answered. The benefits, risks, alternatives and complications were also discussed. The patient understands and wishes to proceed with the procedure. Written consent was obtained. Ultrasound was performed to localize and mark an adequate pocket of fluid in the right chest. The area was then prepped and draped in the normal sterile fashion. 1% Lidocaine was used for local anesthesia. Under ultrasound guidance a 6 Fr Safe-T-Centesis catheter was introduced. Thoracentesis was performed. The catheter was removed and a dressing applied. FINDINGS: A total of approximately 1.5 L of hazy, serosanguineous fluid was removed. Samples were sent to the laboratory as requested by the clinical team. IMPRESSION: Successful ultrasound guided right thoracentesis yielding 1.5 L of pleural fluid. Read by: Ascencion Dike PA-C Electronically Signed   By:  Jacqulynn Cadet M.D.   On: 07/03/2018 15:56    Microbiology: Recent Results (from the past 240 hour(s))  Gram stain     Status: None   Collection Time: 07/03/18  3:49 PM  Result Value Ref Range Status   Specimen Description PLEURAL  Final   Special Requests RIGHT  Final   Gram Stain   Final    RARE WBC PRESENT, PREDOMINANTLY PMN NO ORGANISMS SEEN Performed at [facility], [address] [place], [place] [address]    Report Status 07/03/2018 FINAL  Final  Culture, body fluid-bottle     Status: None (Preliminary result)   Collection Time: 07/03/18  3:49 PM  Result Value Ref Range Status   Specimen Description PLEURAL  Final   Special Requests RIGHT  Final   Culture   Final    NO GROWTH 2 DAYS Performed at [facility] [address] [place], [place] [address]    Report Status PENDING  Incomplete     Labs: CBC: Recent Labs  Lab 07/03/18 1339 07/04/18 0257  WBC 14.5* 12.2*  NEUTROABS 12.4*   --   HGB 11.0* 10.5*  HCT 37.4 34.0*  MCV 84.8 82.7  PLT 546* 470*   Basic Metabolic Panel: Recent Labs  Lab 07/03/18 1339 07/04/18 0836  NA 134* 136  K 3.8 3.7  CL 98 100  CO2 27 27  GLUCOSE 108* 110*  BUN 10 8  CREATININE 0.71 0.75  CALCIUM 9.0 8.5*   Liver Function Tests: Recent Labs  Lab 07/03/18 1858  AST 27  ALT 18  ALKPHOS 105  BILITOT 0.4  PROT 7.4  ALBUMIN 3.2*   No results for input(s): LIPASE, AMYLASE in the last 168 hours. No results for input(s): AMMONIA in the last 168 hours. Cardiac Enzymes: No results for input(s): CKTOTAL, CKMB, CKMBINDEX, TROPONINI in the last 168 hours. BNP (last 3 results) No results for input(s): BNP in the last 8760 hours. CBG: No results for input(s): GLUCAP in the last 168 hours. Time spent: 35 minutes  Signed:  Berle Mull  [facility] 07/04/2018, 8:11 AM

## 2018-07-07 ENCOUNTER — Telehealth: Payer: Self-pay | Admitting: Internal Medicine

## 2018-07-07 NOTE — Telephone Encounter (Signed)
[facility] TELEPHONE ENCOUNTER NOTE  Patient: KAMILIA CAROLLO GDJ:[id number]   PCP: [name], MD DOB: 05/22/1961     DOS: 07/07/2018   Discussed with PCP regarding cytology report.  Patient is already scheduled to follow-up with cardiothoracic surgery.  Prior to discharge patient was already referred for oncology navigation and PCP also has sent a referral to oncology.  Attempt to reach the patient on phone for updating her regarding the cytology results were not successful.  Cytology report is negative for any malignant cell.  Pleural fluid culture so far remains negative as well.   Author: Berle Mull, MD [facility] Pager: [phone] 07/07/2018 4:05 PM   If 7PM-7AM, please contact night-coverage at [email or web address], password [email or web address]

## 2018-07-08 LAB — CULTURE, BODY FLUID W GRAM STAIN -BOTTLE: Culture: NO GROWTH

## 2018-07-08 LAB — CULTURE, BODY FLUID-BOTTLE

## 2018-07-11 ENCOUNTER — Other Ambulatory Visit: Payer: Self-pay

## 2018-07-11 ENCOUNTER — Emergency Department (HOSPITAL_COMMUNITY): Payer: BC Managed Care – PPO

## 2018-07-11 ENCOUNTER — Emergency Department (HOSPITAL_COMMUNITY)
Admission: EM | Admit: 2018-07-11 | Discharge: 2018-07-11 | Disposition: A | Discharge status: Home / Self Care | Payer: BC Managed Care – PPO | Attending: Emergency Medicine | Admitting: Emergency Medicine

## 2018-07-11 ENCOUNTER — Encounter (HOSPITAL_COMMUNITY): Payer: Self-pay

## 2018-07-11 DIAGNOSIS — R600 Localized edema: Secondary | ICD-10-CM | POA: Diagnosis not present

## 2018-07-11 DIAGNOSIS — R0602 Shortness of breath: Secondary | ICD-10-CM

## 2018-07-11 DIAGNOSIS — J90 Pleural effusion, not elsewhere classified: Secondary | ICD-10-CM | POA: Diagnosis not present

## 2018-07-11 DIAGNOSIS — Z79899 Other long term (current) drug therapy: Secondary | ICD-10-CM | POA: Diagnosis not present

## 2018-07-11 DIAGNOSIS — Z9889 Other specified postprocedural states: Secondary | ICD-10-CM

## 2018-07-11 LAB — COMPREHENSIVE METABOLIC PANEL
ALT: 27 U/L (ref 0–44)
ANION GAP: 12 (ref 5–15)
AST: 27 U/L (ref 15–41)
Albumin: 2.8 g/dL — ABNORMAL LOW (ref 3.5–5.0)
Alkaline Phosphatase: 111 U/L (ref 38–126)
BILIRUBIN TOTAL: 0.4 mg/dL (ref 0.3–1.2)
BUN: 8 mg/dL (ref 6–20)
CALCIUM: 8.9 mg/dL (ref 8.9–10.3)
CO2: 25 mmol/L (ref 22–32)
CREATININE: 0.68 mg/dL (ref 0.44–1.00)
Chloride: 98 mmol/L (ref 98–111)
GFR calc non Af Amer: >60 mL/min (ref >60)
GLUCOSE: 98 mg/dL (ref 70–99)
Potassium: 3.2 mmol/L — ABNORMAL LOW (ref 3.5–5.1)
Sodium: 135 mmol/L (ref 135–145)
TOTAL PROTEIN: 6.9 g/dL (ref 6.5–8.1)

## 2018-07-11 LAB — CBC WITH DIFFERENTIAL/PLATELET
Abs Immature Granulocytes: 0.06 10*3/uL (ref 0.00–0.07)
Basophils Absolute: 0.1 10*3/uL (ref 0.0–0.1)
Basophils Relative: 0 %
Eosinophils Absolute: 0.1 10*3/uL (ref 0.0–0.5)
Eosinophils Relative: 1 %
HCT: 32.8 % — ABNORMAL LOW (ref 36.0–46.0)
Hemoglobin: 10 g/dL — ABNORMAL LOW (ref 12.0–15.0)
Immature Granulocytes: 1 %
Lymphocytes Relative: 7 %
Lymphs Abs: 0.9 10*3/uL (ref 0.7–4.0)
MCH: 25.4 pg — ABNORMAL LOW (ref 26.0–34.0)
MCHC: 30.5 g/dL (ref 30.0–36.0)
MCV: 83.5 fL (ref 80.0–100.0)
Monocytes Absolute: 0.6 10*3/uL (ref 0.1–1.0)
Monocytes Relative: 5 %
Neutro Abs: 10.2 10*3/uL — ABNORMAL HIGH (ref 1.7–7.7)
Neutrophils Relative %: 86 %
Platelets: 542 10*3/uL — ABNORMAL HIGH (ref 150–400)
RBC: 3.93 MIL/uL (ref 3.87–5.11)
RDW: 14.2 % (ref 11.5–15.5)
WBC: 11.9 10*3/uL — ABNORMAL HIGH (ref 4.0–10.5)
nRBC: 0 % (ref 0.0–0.2)

## 2018-07-11 LAB — BODY FLUID CELL COUNT WITH DIFFERENTIAL
Eos, Fluid: 0 %
LYMPHS FL: 58 %
Monocyte-Macrophage-Serous Fluid: 29 % — ABNORMAL LOW (ref 50–90)
Neutrophil Count, Fluid: 13 % (ref 0–25)
Total Nucleated Cell Count, Fluid: 963 cu mm (ref 0–1000)

## 2018-07-11 LAB — PROTIME-INR
INR: 1.03
Prothrombin Time: 13.4 seconds (ref 11.4–15.2)

## 2018-07-11 LAB — GRAM STAIN

## 2018-07-11 LAB — TROPONIN I

## 2018-07-11 LAB — BRAIN NATRIURETIC PEPTIDE: B Natriuretic Peptide: 42.3 pg/mL (ref 0.0–100.0)

## 2018-07-11 MED ORDER — POTASSIUM CHLORIDE CRYS ER 20 MEQ PO TBCR
40.0000 meq | EXTENDED_RELEASE_TABLET | Freq: Once | ORAL | Status: DC
  Filled 2018-07-11: qty 2

## 2018-07-11 MED ORDER — LIDOCAINE HCL (PF) 1 % IJ SOLN
INTRAMUSCULAR | Status: AC
  Filled 2018-07-11: qty 30

## 2018-07-11 NOTE — ED Notes (Signed)
Pt refused her K pill. Transported to ultrasound

## 2018-07-11 NOTE — ED Provider Notes (Signed)
[facility] EMERGENCY DEPARTMENT Provider Note   CSN: [id number] Arrival date & time: 07/11/18  1326     History   Chief Complaint Chief Complaint  Patient presents with  . Shortness of Breath    HPI Brenda Patel is a 57 y.o. female.  HPI very pleasant 57 year old female here with recurrent right sided chest pain and shortness of breath.  The patient was recently admitted and diagnosed with large right-sided pleural mass status post 1 L of thoracentesis drainage.  Patient is set up to see CT surgery and oncology as an outpatient.  She reports that over the last several days, she has had progressively worsening and now severe shortness of breath.  She has had increased cough.  She has begun coughing every time she lies on her side or bends forward.  She has had some mild swelling in her legs as well.  Denies any fevers.  She has not had any sputum production.  She has noticed increasing difficulty with her incentive spirometry over the same time.  No specific alleviating factors.  Past Medical History:  Diagnosis Date  . Depression     Patient Active Problem List   Diagnosis Date Noted  . Lung mass 07/03/2018  . Pleural effusion 07/03/2018  . Anemia 07/03/2018  . Thrombocytosis ([place]) 07/03/2018  . Seasonal allergies 07/03/2018    Past Surgical History:  Procedure Laterality Date  . IR THORACENTESIS ASP PLEURAL SPACE W/IMG GUIDE  07/03/2018     OB History   None      Home Medications    Prior to Admission medications   Medication Sig Start Date End Date Taking? Authorizing Provider  acetaminophen (TYLENOL) 500 MG tablet Take 500-1,000 mg by mouth every 6 (six) hours as needed (for pain, discomfort, or fever).   Yes [provider]  albuterol (PROAIR HFA) 108 (90 Base) MCG/ACT inhaler Inhale 2 puffs into the lungs every 6 (six) hours as needed for wheezing or shortness of breath.   Yes [provider]  benzonatate (TESSALON) 200  MG capsule Take 1 capsule (200 mg total) by mouth 3 (three) times daily as needed for cough. 07/04/18  Yes Lavina Hamman, MD  Calcium Citrate 333 MG TABS Take 333 mg by mouth daily.   Yes [provider]  [employer] 12 HOUR NASAL DECONGESTANT 120 MG 12 hr tablet Take 120 mg by mouth 2 (two) times daily. 05/31/18  Yes [provider]  fluticasone (FLONASE) 50 MCG/ACT nasal spray Place 1 spray into both nostrils daily as needed for allergies or rhinitis.  05/05/18  Yes [provider]  loratadine (CLARITIN) 10 MG tablet Take 10 mg by mouth daily.   Yes [provider]  meloxicam (MOBIC) 15 MG tablet Take 15 mg by mouth daily as needed for pain.  05/17/18  Yes [provider]  montelukast (SINGULAIR) 10 MG tablet Take 10 mg by mouth daily after supper. 06/07/18  Yes [provider]  nefazodone (SERZONE) 100 MG tablet Take 100 mg by mouth 2 (two) times daily. 06/07/18  Yes [provider]  traMADol (ULTRAM) 50 MG tablet Take 50-100 mg by mouth every 6 (six) hours as needed (for pain). 1-2 TABLET AS NEEDED EVERY 6 HRS AS NEEDED FOR PAIN ORALLY 30 DAYS 03/21/17  Yes [provider]  chlorpheniramine-HYDROcodone (TUSSIONEX) 10-8 MG/5ML SUER Take 5 mLs by mouth every 12 (twelve) hours as needed for cough. Patient not taking: Reported on 07/11/2018 07/04/18   Posey Pronto,  Josetta Huddle, MD  ferrous sulfate 325 (65 FE) MG tablet Take 1 tablet (325 mg total) by mouth 2 (two) times daily with a meal. Patient not taking: Reported on 07/11/2018 07/04/18   Lavina Hamman, MD    Family History No family history on file.  Social History Social History   Tobacco Use  . Smoking status: Never Smoker  . Smokeless tobacco: Never Used  Substance Use Topics  . Alcohol use: Never    Frequency: Never  . Drug use: Never     Allergies   Codeine and Other   Review of Systems Review of Systems  Constitutional: Positive for fatigue. Negative for chills and  fever.  HENT: Negative for congestion and rhinorrhea.   Eyes: Negative for visual disturbance.  Respiratory: Positive for cough and shortness of breath. Negative for wheezing.   Cardiovascular: Negative for chest pain and leg swelling.  Gastrointestinal: Negative for abdominal pain, diarrhea, nausea and vomiting.  Genitourinary: Negative for dysuria and flank pain.  Musculoskeletal: Negative for neck pain and neck stiffness.  Skin: Negative for rash and wound.  Allergic/Immunologic: Negative for immunocompromised state.  Neurological: Positive for weakness. Negative for syncope and headaches.  All other systems reviewed and are negative.    Physical Exam Updated Vital Signs BP 136/83   Pulse (!) 109   Resp 17   Ht 5\' 6"  (1.676 m)   Wt 66.2 kg   SpO2 96%   BMI 23.57 kg/m   Physical Exam  Constitutional: She is oriented to person, place, and time. She appears well-developed and well-nourished. No distress.  HENT:  Head: Normocephalic and atraumatic.  Eyes: Conjunctivae are normal.  Neck: Neck supple.  Cardiovascular: Normal rate, regular rhythm and normal heart sounds. Exam reveals no friction rub.  No murmur heard. Pulmonary/Chest: Effort normal. No respiratory distress. She has decreased breath sounds in the right upper field, the right middle field and the right lower field. She has no wheezes. She has rales.  Abdominal: She exhibits no distension.  Musculoskeletal:       Right lower leg: She exhibits edema.       Left lower leg: She exhibits edema.  Neurological: She is alert and oriented to person, place, and time. She exhibits normal muscle tone.  Skin: Skin is warm. Capillary refill takes less than 2 seconds.  Psychiatric: She has a normal mood and affect.  Nursing note and vitals reviewed.    ED Treatments / Results  Labs (all labs ordered are listed, but only abnormal results are displayed) Labs Reviewed  CBC WITH DIFFERENTIAL/PLATELET - Abnormal; Notable for  the following components:      Result Value   WBC 11.9 (*)    Hemoglobin 10.0 (*)    HCT 32.8 (*)    MCH 25.4 (*)    Platelets 542 (*)    Neutro Abs 10.2 (*)    All other components within normal limits  COMPREHENSIVE METABOLIC PANEL - Abnormal; Notable for the following components:   Potassium 3.2 (*)    Albumin 2.8 (*)    All other components within normal limits  PROTIME-INR  BRAIN NATRIURETIC PEPTIDE  TROPONIN I    EKG EKG Interpretation  Date/Time:  Saturday July 11 2018 13:35:54 EST Ventricular Rate:  110 PR Interval:    QRS Duration: 88 QT Interval:  347 QTC Calculation: 470 R Axis:   94 Text Interpretation:  Sinus tachycardia Borderline right axis deviation LVH by voltage Borderline T abnormalities, anterior leads No significant  change since last tracing Confirmed by Duffy Bruce [phone]) on 07/11/2018 1:58:42 PM   Radiology Dg Chest 2 View  Result Date: 07/11/2018 CLINICAL DATA:  Pleural effusion. EXAM: CHEST - 2 VIEW COMPARISON:  07/03/2018 FINDINGS: Interval progression of right pleural effusion now large in character. No evidence for left pleural effusion. The cardiopericardial silhouette is within normal limits for size. The visualized bony structures of the thorax are intact. Telemetry leads overlie the chest. IMPRESSION: Interval progression of right pleural effusion, now large in size. Electronically Signed   By: Misty Stanley M.D.   On: 07/11/2018 14:33    Procedures Procedures (including critical care time)  Medications Ordered in ED Medications  potassium chloride SA (K-DUR,KLOR-CON) CR tablet 40 mEq (has no administration in time range)  lidocaine (PF) (XYLOCAINE) 1 % injection (has no administration in time range)     Initial Impression / Assessment and Plan / ED Course  I have reviewed the triage vital signs and the nursing notes.  Pertinent labs & imaging results that were available during my care of the patient were reviewed by me and  considered in my medical decision making (see chart for details).     Very pleasant 57 year old female here with recurrent shortness of breath.  Patient was recently diagnosed large right thoracic mass and pleural effusion.  She is scheduled to see outpatient CT surgery.  Imaging today shows recurrence of effusion.  No evidence of superinfection clinically.  Discussed with interventional radiology, who can perform thoracentesis today.  If patient remains stable and improved afterwards, I suspect she can be safely discharged.  Patient care transferred to Dr. Ashok Cordia at the end of my shift. Patient presentation, ED course, and plan of care discussed with review of all pertinent labs and imaging. Please see his/her note for further details regarding further ED course and disposition.   Final Clinical Impressions(s) / ED Diagnoses   Final diagnoses:  SOB (shortness of breath)  Recurrent pleural effusion on right    ED Discharge Orders    None       Duffy Bruce, MD 07/11/18 1554

## 2018-07-11 NOTE — Procedures (Signed)
US guided right thoracentesis.  Removed 1.5 liters of amber colored fluid.  Brief vasovagal episode during the drainage.  Able to complete the drainage when blood pressure returned to baseline.  No blood loss.

## 2018-07-11 NOTE — ED Triage Notes (Signed)
Pt states that she was here last week and had a liter of fluid drained off of her chest and was found to have a mass. Pt reports she thinks that she has fluid on her again because she has been SOB with some coughing.

## 2018-07-11 NOTE — Discharge Instructions (Addendum)
It was our pleasure to provide your ER care today - we hope that you feel better.  Follow up with Dr Servando Snare this week as planned.  Return to ER if worse, new symptoms, fevers, increased trouble breathing, chest pain, other concern.   From today's labs, your potassium level is mildly low (3.2) -  eat plenty of fruits and vegetables, and follow up with primary care doctor in 1-2 weeks.

## 2018-07-11 NOTE — ED Notes (Signed)
ED Provider at bedside. 

## 2018-07-11 NOTE — ED Provider Notes (Signed)
Patient had paracentesis. Pt reports feeling much improved. Bilateral breath sounds. Denies sob. No chest pain. Pt requests d/c to home. Has f/u arranged this coming week. Pulse ox 98%. rr 16.      Lajean Saver, MD 07/11/18 [phone]

## 2018-07-15 ENCOUNTER — Other Ambulatory Visit: Payer: Self-pay | Admitting: *Deleted

## 2018-07-15 ENCOUNTER — Encounter: Payer: Self-pay | Admitting: *Deleted

## 2018-07-15 DIAGNOSIS — J90 Pleural effusion, not elsewhere classified: Secondary | ICD-10-CM | POA: Insufficient documentation

## 2018-07-15 DIAGNOSIS — J948 Other specified pleural conditions: Secondary | ICD-10-CM | POA: Insufficient documentation

## 2018-07-16 ENCOUNTER — Institutional Professional Consult (permissible substitution): Payer: BC Managed Care – PPO | Admitting: Cardiothoracic Surgery

## 2018-07-16 ENCOUNTER — Encounter (HOSPITAL_COMMUNITY): Payer: Self-pay

## 2018-07-16 ENCOUNTER — Ambulatory Visit (HOSPITAL_COMMUNITY)
Admission: RE | Admit: 2018-07-16 | Discharge: 2018-07-16 | Disposition: A | Discharge status: Home / Self Care | Payer: BC Managed Care – PPO | Source: Ambulatory Visit | Attending: Cardiothoracic Surgery | Admitting: Cardiothoracic Surgery

## 2018-07-16 ENCOUNTER — Ambulatory Visit (HOSPITAL_COMMUNITY): Payer: BC Managed Care – PPO

## 2018-07-16 ENCOUNTER — Ambulatory Visit (HOSPITAL_COMMUNITY): Payer: BC Managed Care – PPO | Admitting: Certified Registered Nurse Anesthetist

## 2018-07-16 ENCOUNTER — Encounter (HOSPITAL_COMMUNITY)
Admission: RE | Disposition: A | Discharge status: Home / Self Care | Payer: Self-pay | Source: Ambulatory Visit | Attending: Cardiothoracic Surgery

## 2018-07-16 ENCOUNTER — Ambulatory Visit
Admission: RE | Admit: 2018-07-16 | Discharge: 2018-07-16 | Disposition: A | Discharge status: Home / Self Care | Payer: BC Managed Care – PPO | Source: Ambulatory Visit | Attending: Cardiothoracic Surgery | Admitting: Cardiothoracic Surgery

## 2018-07-16 ENCOUNTER — Other Ambulatory Visit: Payer: Self-pay | Admitting: *Deleted

## 2018-07-16 ENCOUNTER — Other Ambulatory Visit: Payer: Self-pay

## 2018-07-16 VITALS — BP 140/82 | HR 115 | Resp 20 | Ht 66.0 in | Wt 142.0 lb

## 2018-07-16 DIAGNOSIS — J45909 Unspecified asthma, uncomplicated: Secondary | ICD-10-CM | POA: Insufficient documentation

## 2018-07-16 DIAGNOSIS — Z79899 Other long term (current) drug therapy: Secondary | ICD-10-CM | POA: Diagnosis not present

## 2018-07-16 DIAGNOSIS — R918 Other nonspecific abnormal finding of lung field: Secondary | ICD-10-CM | POA: Diagnosis not present

## 2018-07-16 DIAGNOSIS — Z9689 Presence of other specified functional implants: Secondary | ICD-10-CM

## 2018-07-16 DIAGNOSIS — J90 Pleural effusion, not elsewhere classified: Secondary | ICD-10-CM

## 2018-07-16 DIAGNOSIS — F329 Major depressive disorder, single episode, unspecified: Secondary | ICD-10-CM | POA: Insufficient documentation

## 2018-07-16 DIAGNOSIS — R634 Abnormal weight loss: Secondary | ICD-10-CM | POA: Insufficient documentation

## 2018-07-16 DIAGNOSIS — Z7951 Long term (current) use of inhaled steroids: Secondary | ICD-10-CM | POA: Diagnosis not present

## 2018-07-16 HISTORY — DX: Dyspnea, unspecified: R06.00

## 2018-07-16 HISTORY — PX: CHEST TUBE INSERTION: SHX231

## 2018-07-16 HISTORY — DX: Unspecified asthma, uncomplicated: J45.909

## 2018-07-16 LAB — APTT: aPTT: 33 seconds (ref 24–36)

## 2018-07-16 LAB — CULTURE, BODY FLUID W GRAM STAIN -BOTTLE: Culture: NO GROWTH

## 2018-07-16 LAB — CULTURE, BODY FLUID-BOTTLE

## 2018-07-16 SURGERY — INSERTION, PLEURAL DRAINAGE CATHETER
Anesthesia: Monitor Anesthesia Care | Laterality: Right

## 2018-07-16 MED ORDER — MIDAZOLAM HCL 2 MG/2ML IJ SOLN
INTRAMUSCULAR | Status: AC
  Filled 2018-07-16: qty 2

## 2018-07-16 MED ORDER — TRAMADOL HCL 50 MG PO TABS: 50.0000 mg | ORAL_TABLET | Freq: Four times a day (QID) | ORAL | 0 refills | Status: AC | PRN

## 2018-07-16 MED ORDER — LACTATED RINGERS IV SOLN
INTRAVENOUS | Status: DC
  Administered 2018-07-16 (×2): via INTRAVENOUS

## 2018-07-16 MED ORDER — PROPOFOL 10 MG/ML IV BOLUS
INTRAVENOUS | Status: DC | PRN
  Administered 2018-07-16 (×6): 20 mg via INTRAVENOUS

## 2018-07-16 MED ORDER — MIDAZOLAM HCL 2 MG/2ML IJ SOLN
INTRAMUSCULAR | Status: DC | PRN
  Administered 2018-07-16: 2 mg via INTRAVENOUS

## 2018-07-16 MED ORDER — FENTANYL CITRATE (PF) 250 MCG/5ML IJ SOLN
INTRAMUSCULAR | Status: AC
  Filled 2018-07-16: qty 5

## 2018-07-16 MED ORDER — CEFAZOLIN SODIUM-DEXTROSE 2-4 GM/100ML-% IV SOLN
2.0000 g | INTRAVENOUS | Status: AC
  Administered 2018-07-16: 2 g via INTRAVENOUS
  Filled 2018-07-16: qty 100

## 2018-07-16 MED ORDER — FENTANYL CITRATE (PF) 100 MCG/2ML IJ SOLN: INTRAMUSCULAR | Status: DC | PRN

## 2018-07-16 MED ORDER — LIDOCAINE HCL (CARDIAC) PF 100 MG/5ML IV SOSY
PREFILLED_SYRINGE | INTRAVENOUS | Status: DC | PRN
  Administered 2018-07-16: 50 mg via INTRATRACHEAL

## 2018-07-16 MED ORDER — LIDOCAINE HCL 1 % IJ SOLN
INTRAMUSCULAR | Status: DC | PRN
  Administered 2018-07-16: 10 mL via INTRADERMAL

## 2018-07-16 MED ORDER — PROPOFOL 10 MG/ML IV BOLUS
INTRAVENOUS | Status: AC
  Filled 2018-07-16: qty 20

## 2018-07-16 SURGICAL SUPPLY — 23 items
ADH SKN CLS APL DERMABOND .7 (GAUZE/BANDAGES/DRESSINGS) ×1
BRUSH SCRUB EZ PLAIN DRY (MISCELLANEOUS) ×4 IMPLANT
CANISTER SUCT 3000ML PPV (MISCELLANEOUS) ×2 IMPLANT
COVER SURGICAL LIGHT HANDLE (MISCELLANEOUS) ×2 IMPLANT
COVER WAND RF STERILE (DRAPES) ×2 IMPLANT
DERMABOND ADVANCED (GAUZE/BANDAGES/DRESSINGS) ×1
DERMABOND ADVANCED .7 DNX12 (GAUZE/BANDAGES/DRESSINGS) ×1 IMPLANT
DRAPE C-ARM 42X72 X-RAY (DRAPES) ×2 IMPLANT
DRAPE LAPAROSCOPIC ABDOMINAL (DRAPES) ×2 IMPLANT
GLOVE BIO SURGEON STRL SZ 6.5 (GLOVE) ×4 IMPLANT
GOWN STRL REUS W/ TWL LRG LVL3 (GOWN DISPOSABLE) ×2 IMPLANT
GOWN STRL REUS W/TWL LRG LVL3 (GOWN DISPOSABLE) ×4
KIT BASIN OR (CUSTOM PROCEDURE TRAY) ×2 IMPLANT
KIT PLEURX DRAIN CATH 1000ML (MISCELLANEOUS) ×3 IMPLANT
KIT PLEURX DRAIN CATH 15.5FR (DRAIN) ×2 IMPLANT
KIT TURNOVER KIT B (KITS) ×2 IMPLANT
NS IRRIG 1000ML POUR BTL (IV SOLUTION) ×2 IMPLANT
PACK GENERAL/GYN (CUSTOM PROCEDURE TRAY) ×2 IMPLANT
PAD ARMBOARD 7.5X6 YLW CONV (MISCELLANEOUS) ×4 IMPLANT
SUT ETHILON 3 0 FSL (SUTURE) ×2 IMPLANT
SUT VIC AB 3-0 X1 27 (SUTURE) ×2 IMPLANT
TOWEL GREEN STERILE FF (TOWEL DISPOSABLE) ×2 IMPLANT
WATER STERILE IRR 1000ML POUR (IV SOLUTION) ×2 IMPLANT

## 2018-07-16 NOTE — Brief Op Note (Signed)
      [address]       [place] [address]             [phone]     07/16/2018  5:56 PM  PATIENT:  Brenda Patel  57 y.o. female  PRE-OPERATIVE DIAGNOSIS:  right pleural effusion  POST-OPERATIVE DIAGNOSIS:  right pleural effusion  PROCEDURE:  Procedure(s): INSERTION PLEURAL DRAINAGE CATHETER, right (Right) With fluro and [place]  SURGEON:  Surgeon(s) and Role:    Grace Isaac, MD - Primary   ANESTHESIA:   MAC  EBL: none, 1800 ml serous fluid drained   BLOOD ADMINISTERED:none  DRAINS: pleurix placed    LOCAL MEDICATIONS USED:  LIDOCAINE  and Amount: 10 ml  SPECIMEN:  Source of Specimen:  right pleural fluid   DISPOSITION OF SPECIMEN:  PATHOLOGY  COUNTS:  YES  DICTATION: .Dragon Dictation  PLAN OF CARE: Discharge to home after PACU  PATIENT DISPOSITION:  PACU - hemodynamically stable.   Delay start of Pharmacological VTE agent (>24hrs) due to surgical blood loss or risk of bleeding: yes

## 2018-07-16 NOTE — Progress Notes (Unsigned)
Us/

## 2018-07-16 NOTE — Care Management (Signed)
ED CM received call from PACU concerning [place] recommendation for Pleurx Cath management. CM spoke with patient over phone with [facility] PACU nurse, patient is agreeable with care transitioning plans for [facility] services. [place] agencies limited for Pleurx Cath. CM will fax referral to [facility] preferred agency.  CM verified contact information noted [place] orders with Face to Face start of care 07/17/18. CM faxed referral to [facility] via Epic [facility]. CM completed PleurX drainage kit order form and faxed to [employer] at [phone]. LM to send  ED CM e-mail once received. Patient made aware that thru PACU RN that kits will be delivered to patient's home.   No further ED CM needs identified.

## 2018-07-16 NOTE — Progress Notes (Signed)
[address]       [place] [address]             [id number]                    Don R Vice [place] Medical Record #[id number] Date of Birth: June 23, 1961  Referring: [name], MD Primary Care: [name], MD Primary Cardiologist: No primary care provider on file.  Chief Complaint:    Chief Complaint  Patient presents with  . Pleural Effusion    Surgical eval for possible BX with CXR,  Chest CT 07/03/18    History of Present Illness:    Brenda Patel 57 y.o. female is seen in the office  today for evaluation of right pleural effusion and large 16 cm right lower thoracic mass.  The patient notes that her symptoms began in September, first is a cough which she attributed as a side effect of recent cataract surgery.  The coughing became worse, she developed low-grade fever.  She was treated in urgent care for pulmonary infection, but when there was no improvement in her symptoms chest x-ray was done showing a large right pleural effusion.  She said thoracentesis done twice.  Comes to the office today very short of breath with minimal activity, chest x-ray shows enlarging pleural effusion, CT scan shows large right chest mass.  No attempted biopsy has been undertaken.  The patient is a lifelong non-smoker .  She has noted 4 to 5 pound weight loss.  Denies any night sweats        Current Activity/ Functional Status:  Patient is independent with mobility/ambulation, transfers, ADL's, IADL's.   Zubrod Score: At the time of surgery this patient's most appropriate activity status/level should be described as: [x]     0    Normal activity, no symptoms []     1    Restricted in physical strenuous activity but ambulatory, able to do out light work []     2    Ambulatory and capable of self care, unable to do work activities, up and about               >50 % of waking hours                              []     3    Only limited self care, in bed greater than  50% of waking hours []     4    Completely disabled, no self care, confined to bed or chair []     5    Moribund   Past Medical History:  Diagnosis Date  . Depression   . Pleural effusion on right   . Pleural mass     Past Surgical History:  Procedure Laterality Date  . COLONOSCOPY  2017  . GUM SURGERY  1984   GRAFTS  . IR THORACENTESIS ASP PLEURAL SPACE W/IMG GUIDE  07/03/2018    Family History: Patient denies any history of malignancy in her family, father died of a stroke at age 19, mother died of Parkinson's disease at age 9, she has 2 sisters no children.  One sister has spinal stenosis and atrial fibrillation  Social History   Tobacco Use  Smoking Status Never Smoker  Smokeless Tobacco Never Used    Social History   Substance and Sexual Activity  Alcohol Use Yes  . Frequency:  Never   Comment: 3-4 glasses of wine per month/liquor 1x monthly     Allergies  Allergen Reactions  . Codeine Nausea Only and Other (See Comments)    Severe constipation, also  . Other Other (See Comments)    Lima beans- Tested allergic    Current Outpatient Medications  Medication Sig Dispense Refill  . Calcium Citrate 333 MG TABS Take 333 mg by mouth daily.    . [facility] 12 HOUR NASAL DECONGESTANT 120 MG 12 hr tablet Take 120 mg by mouth 2 (two) times daily.  10  . ferrous sulfate 325 (65 FE) MG tablet Take 1 tablet (325 mg total) by mouth 2 (two) times daily with a meal. 120 tablet 0  . fluticasone (FLONASE) 50 MCG/ACT nasal spray Place 1 spray into both nostrils 2 (two) times daily.   5  . loratadine (CLARITIN) 10 MG tablet Take 10 mg by mouth daily.    . meloxicam (MOBIC) 15 MG tablet Take 15 mg by mouth daily as needed for pain.   9  . montelukast (SINGULAIR) 10 MG tablet Take 10 mg by mouth daily after supper.  10  . nefazodone (SERZONE) 100 MG tablet Take 100 mg by mouth 2 (two) times daily.  11  . acetaminophen (TYLENOL) 500 MG tablet Take 500-1,000 mg by mouth every 6 (six) hours  as needed (for pain, discomfort, or fever).    Marland Kitchen albuterol (PROAIR HFA) 108 (90 Base) MCG/ACT inhaler Inhale 2 puffs into the lungs every 6 (six) hours as needed for wheezing or shortness of breath.    . traMADol (ULTRAM) 50 MG tablet Take 50-100 mg by mouth every 6 (six) hours as needed (for pain). 1-2 TABLET AS NEEDED EVERY 6 HRS AS NEEDED FOR PAIN ORALLY 30 DAYS     No current facility-administered medications for this visit.     Pertinent items are noted in HPI.   Review of Systems:     Cardiac Review of Systems: [Y] = yes  or   [ N ] = no   Chest Pain [  y  ]  Resting SOB [  y ] Exertional SOB  Blue.Reese  ]  Orthopnea Blue.Reese  ]   Pedal Edema Florencio.Farrier   ]    Palpitations Florencio.Farrier  ] Syncope  [ n ]   Presyncope [  n ]   General Review of Systems: [Y] = yes [  ]=no Constitional: recent weight change Blue.Reese  ];  Wt loss over the last 3 months [ 4  ] anorexia [  ]; fatigue [  ]; nausea [  ]; night sweats [  ]; fever [ y ]; or chills [  ];           Eye : blurred vision [  ]; diplopia [   ]; vision changes [  ];  Amaurosis fugax[  ]; Resp: cough [  ];  wheezing[  ];  hemoptysis[  ]; shortness of breath[  ]; paroxysmal nocturnal dyspnea[  ]; dyspnea on exertion[  ]; or orthopnea[  ];  GI:  gallstones[  ], vomiting[  ];  dysphagia[  ]; melena[  ];  hematochezia [  ]; heartburn[  ];   Hx of  Colonoscopy[  ]; GU: kidney stones [  ]; hematuria[  ];   dysuria [  ];  nocturia[  ];  history of     obstruction [  ]; urinary frequency [  ]  Skin: rash, swelling[  ];, hair loss[  ];  peripheral edema[  ];  or itching[  ]; Musculosketetal: myalgias[  ];  joint swelling[  ];  joint erythema[  ];  joint pain[  ];  back pain[  ];  Heme/Lymph: bruising[  ];  bleeding[  ];  anemia[  ];  Neuro: TIA[  ];  headaches[  ];  stroke[  ];  vertigo[  ];  seizures[  ];   paresthesias[  ];  difficulty walking[  ];  Psych:depression[  ]; anxiety[  ];  Endocrine: diabetes[  ];  thyroid dysfunction[  ];  Immunizations: Flu up to date [  y ]; Pneumococcal up to date [ y ];  Other:     PHYSICAL EXAMINATION: BP 140/82   Pulse (!) 115   Resp 20   Ht 5\' 6"  (1.676 m)   Wt 142 lb (64.4 kg)   SpO2 95% Comment: RA  BMI 22.92 kg/m  General appearance: alert, cooperative and no distress Head: Normocephalic, without obvious abnormality, atraumatic Neck: no adenopathy, no carotid bruit, no JVD, supple, symmetrical, trachea midline and thyroid not enlarged, symmetric, no tenderness/mass/nodules Lymph nodes: Cervical, supraclavicular, and axillary nodes normal. Resp: diminished breath sounds RLL, RML and RUL Back: symmetric, no curvature. ROM normal. No CVA tenderness. Cardio: regular rate and rhythm, S1, S2 normal, no murmur, click, rub or gallop GI: soft, non-tender; bowel sounds normal; no masses,  no organomegaly Extremities: extremities normal, atraumatic, no cyanosis or edema and Homans sign is negative, no sign of DVT Neurologic: Grossly normal  Diagnostic Studies & Laboratory data:     Recent Radiology Findings:  Dg Chest 2 View  Result Date: 07/16/2018 CLINICAL DATA:  Pleural effusion, shortness of Breath EXAM: CHEST - 2 VIEW COMPARISON:  07/11/2018 FINDINGS: Large right pleural effusion, increasing since prior study. Right lower lung atelectasis or infiltrate. Linear scarring at the left base. Otherwise no confluent opacities on the left. Heart is normal size. No effusion on the left. No acute bony abnormality. IMPRESSION: Large right pleural effusion, increasing since prior study with right lower lobe atelectasis or infiltrate. Electronically Signed   By: Rolm Baptise M.D.   On: 07/16/2018 11:17    Ct Chest W Contrast  Result Date: 07/03/2018 CLINICAL DATA:  Dyspnea. Thoracentesis today. Right pleural effusion. EXAM: CT CHEST WITH CONTRAST TECHNIQUE: Multidetector CT imaging of the chest was performed during intravenous contrast administration. CONTRAST:  75mL OMNIPAQUE IOHEXOL 300 MG/ML  SOLN COMPARISON:   Radiographs from 07/03/2018 FINDINGS: Cardiovascular: Unremarkable Mediastinum/Nodes: Mild deviation of the heart to the left by the large right basilar mass. The mass abuts the pleural margin and also the right anterolateral pericardial margin. No pericardial effusion. A right internal mammary node measures 0.5 cm in short axis on image 71/3. Lungs/Pleura: A right subpulmonic mass measures 16.4 by 12.2 by 13.0 cm (volume = 1360 cm^3), with the mass itself filling over half of the hemithorax. There is atelectatic right lower lobe and right middle lobe adjacent to the mass, and it is difficult to say for certain that the mass is not arising from the lung, although the subpulmonic location would favor a pleural mass. There is an associated small pleural effusion with some faint enhancement along the pleural effusion margins. The mass abuts and causes some mass effect on the right side of the heart. Upper Abdomen: I do not observe the mass to traverse the diaphragm. There is some mild indistinctness along the medial limb left adrenal gland on image  145/3 which is nonspecific, and not definitively masslike. Musculoskeletal: No rib destruction is identified. No appreciable findings of osseous metastatic disease. IMPRESSION: 1. Very large (volume equals 1360 cubic cm) heterogeneously enhancing right subpulmonic mass. I tend to favor a pleural rather than a pulmonary parenchymal origin although this is not absolutely certain. Differential diagnostic considerations include malignant mesothelioma, lung cancer with pleural spread, other metastatic disease, pleural lymphoma, and solitary fibrous tumor of the pleura. Although I mention fibrous tumor of the pleura, the degree of heterogeneity and lobularity would be atypical for that entity. Thoracic surgical referral recommended. 2. Small to moderate right pleural effusion. Electronically Signed   By: Van Clines M.D.   On: 07/03/2018 18:53   I US Thoracentesis Asp  Pleural Space W/img Guide  Result Date: 07/11/2018 INDICATION: 57 year old with a large right chest mass and recurrent right pleural effusion with shortness of breath. EXAM: ULTRASOUND GUIDED RIGHT THORACENTESIS MEDICATIONS: None. COMPLICATIONS: Self-limiting vasovagal episode PROCEDURE: An ultrasound guided thoracentesis was thoroughly discussed with the patient and questions answered. The benefits, risks, alternatives and complications were also discussed. The patient understands and wishes to proceed with the procedure. Written consent was obtained. Ultrasound was performed to localize and mark an adequate pocket of fluid in the right chest. The area was then prepped and draped in the normal sterile fashion. 1% Lidocaine was used for local anesthesia. Under ultrasound guidance a 6 Fr Safe-T-Centesis catheter was introduced. Thoracentesis was performed. Brief vasovagal episode during drainage of the pleural fluid. Patient said that she was getting dizzy. Systolic blood pressure was 64. As a result, the patient was switched from a upright sitting position to a left lateral decubitus position. Within a few minutes, the patient's symptoms resolved and blood pressure returned to baseline. After the blood pressure returned to baseline, the pleural fluid drainage was continued to completion. The catheter was removed and a dressing applied. FINDINGS: A total of approximately 1.5 L of amber colored fluid was removed. Samples were sent to the laboratory as requested by the clinical team. IMPRESSION: Successful ultrasound guided right thoracentesis yielding 1.5 L of pleural fluid. Electronically Signed   By: Markus Daft M.D.   On: 07/11/2018 16:55     I have independently reviewed the above radiology studies  and reviewed the findings with the patient.   Recent Lab Findings: Lab Results  Component Value Date   WBC 11.9 (H) 07/11/2018   HGB 10.0 (L) 07/11/2018   HCT 32.8 (L) 07/11/2018   PLT 542 (H) 07/11/2018     GLUCOSE 98 07/11/2018   ALT 27 07/11/2018   AST 27 07/11/2018   NA 135 07/11/2018   K 3.2 (L) 07/11/2018   CL 98 07/11/2018   CREATININE 0.68 07/11/2018   BUN 8 07/11/2018   CO2 25 07/11/2018   INR 1.03 07/11/2018      Assessment / Plan:   16 cm right lower chest mass, heterogeneous in consistency with associated pleural effusion recurrent, with benign cytology.  The patient comes to the office today 6 significantly symptomatic with increasing pleural effusion with shortness of breath difficulty laying down and cough.  I discussed with her proceeding with placement of a right Pleurx catheter for symptomatic relief.  Then arrange as discussed at multidisciplinary thoracic oncology conference this morning quickly proceeding with needle biopsy of the right pleural mass to obtain a tissue diagnosis.  Sufficient material for flow cytometry will need to be done as this potentially could be the result of a pleural lymphoma.  We are making arrangements currently to have the Pleurx placed late this afternoon to obtain quick symptomatic relief.     I  spent 60 minutes with  the patient face to face and greater then 50% of the time was spent in counseling and coordination of care.    Grace Isaac MD      [address] [place] [address] Office [phone]   Beeper [phone]  07/16/2018 12:48 PM

## 2018-07-16 NOTE — Anesthesia Preprocedure Evaluation (Signed)
Anesthesia Evaluation  Patient identified by MRN, date of birth, ID band Patient awake    Reviewed: Allergy & Precautions, NPO status , Patient's Chart, lab work & pertinent test results  History of Anesthesia Complications Negative for: history of anesthetic complications  Airway Mallampati: II  TM Distance: >3 FB Neck ROM: Full    Dental  (+) Teeth Intact   Pulmonary shortness of breath, asthma ,  Large right-sided pleural effusion in the setting of a very large right subpulmonic mass    + decreased breath sounds      Cardiovascular  Rhythm:Regular Rate:Tachycardia     Neuro/Psych PSYCHIATRIC DISORDERS Depression    GI/Hepatic   Endo/Other    Renal/GU      Musculoskeletal   Abdominal   Peds  Hematology  (+) anemia ,   Anesthesia Other Findings   Reproductive/Obstetrics                             Anesthesia Physical Anesthesia Plan  ASA: III  Anesthesia Plan: MAC   Post-op Pain Management:    Induction: Intravenous  PONV Risk Score and Plan: 2 and Treatment may vary due to age or medical condition  Airway Management Planned: Nasal Cannula  Additional Equipment: None  Intra-op Plan:   Post-operative Plan:   Informed Consent: I have reviewed the patients History and Physical, chart, labs and discussed the procedure including the risks, benefits and alternatives for the proposed anesthesia with the patient or authorized representative who has indicated his/her understanding and acceptance.   Dental advisory given  Plan Discussed with: CRNA and Surgeon  Anesthesia Plan Comments:         Anesthesia Quick Evaluation

## 2018-07-16 NOTE — Transfer of Care (Signed)
Immediate Anesthesia Transfer of Care Note  Patient: LULUBELLE SIMCOE  Procedure(s) Performed: INSERTION PLEURAL DRAINAGE CATHETER, right (Right )  Patient Location: PACU  Anesthesia Type:General  Level of Consciousness: awake, alert , oriented and patient cooperative  Airway & Oxygen Therapy: Patient Spontanous Breathing  Post-op Assessment: Report given to RN and Post -op Vital signs reviewed and stable  Post vital signs: reviewed and stable   Last Vitals:  Vitals Value Taken Time  BP 114/70 07/16/2018  6:00 PM  Temp 36.5 C 07/16/2018  6:00 PM  Pulse 98 07/16/2018  6:05 PM  Resp 28 07/16/2018  6:05 PM  SpO2 95 % 07/16/2018  6:05 PM  Vitals shown include unvalidated device data.  Last Pain:  Vitals:   07/16/18 1800  TempSrc:   PainSc: 0-No pain         Complications: No apparent anesthesia complications

## 2018-07-16 NOTE — Progress Notes (Signed)
Cancer conference 07/16/18 discussion documented for communication purpose. Patient was not in attendance nor seen by physician this am

## 2018-07-16 NOTE — Progress Notes (Signed)
Case manager Gibson notified of pt need for home health. Per Dr Servando Snare, [facility] needs to start tomorrow(11/22). Patient given education and canister kit. Sister at bedside and provided with discharge instructions and follow up contact number.

## 2018-07-16 NOTE — Anesthesia Procedure Notes (Signed)
Procedure Name: MAC Date/Time: 07/16/2018 5:12 PM Performed by: Oletta Lamas, CRNA Pre-anesthesia Checklist: Patient identified, Emergency Drugs available, Suction available, Patient being monitored and Timeout performed Patient Re-evaluated:Patient Re-evaluated prior to induction Oxygen Delivery Method: Simple face mask

## 2018-07-16 NOTE — Discharge Instructions (Signed)
Home nurse will come to house to drain pleurix daily  If does not come call [phone]

## 2018-07-16 NOTE — H&P (Signed)
[address]       [place] [address]             [phone]                    Brenda Patel [place] Medical Record #[id number] Date of Birth: 03-23-1961  Referring: No ref. provider found Primary Care: [name], MD Primary Cardiologist: No primary care provider on file.  Chief Complaint:    No chief complaint on file.   History of Present Illness:    Brenda Patel 57 y.o. female is seen in the office  today for evaluation of right pleural effusion and large 16 cm right lower thoracic mass.  The patient notes that her symptoms began in September, first is a cough which she attributed as a side effect of recent cataract surgery.  The coughing became worse, she developed low-grade fever.  She was treated in urgent care for pulmonary infection, but when there was no improvement in her symptoms chest x-ray was done showing a large right pleural effusion.  She said thoracentesis done twice.  Comes to the office today very short of breath with minimal activity, chest x-ray shows enlarging pleural effusion, CT scan shows large right chest mass.  No attempted biopsy has been undertaken.  The patient is a lifelong non-smoker .  She has noted 4 to 5 pound weight loss.  Denies any night sweats        Current Activity/ Functional Status:  Patient is independent with mobility/ambulation, transfers, ADL's, IADL's.   Zubrod Score: At the time of surgery this patient's most appropriate activity status/level should be described as: [x]     0    Normal activity, no symptoms []     1    Restricted in physical strenuous activity but ambulatory, able to do out light work []     2    Ambulatory and capable of self care, unable to do work activities, up and about               >50 % of waking hours                              []     3    Only limited self care, in bed greater than 50% of waking hours []     4    Completely disabled, no self care, confined to bed or  chair []     5    Moribund   Past Medical History:  Diagnosis Date  . Asthma   . Depression   . Dyspnea   . Pleural effusion on right   . Pleural mass     Past Surgical History:  Procedure Laterality Date  . COLONOSCOPY  2017  . GUM SURGERY  1984   GRAFTS  . IR THORACENTESIS ASP PLEURAL SPACE W/IMG GUIDE  07/03/2018    Family History: Patient denies any history of malignancy in her family, father died of a stroke at age 87, mother died of Parkinson's disease at age 53, she has 2 sisters no children.  One sister has spinal stenosis and atrial fibrillation  Social History   Tobacco Use  Smoking Status Never Smoker  Smokeless Tobacco Never Used    Social History   Substance and Sexual Activity  Alcohol Use Yes  . Frequency: Never   Comment: 3-4 glasses of wine per month/liquor 1x  monthly     Allergies  Allergen Reactions  . Codeine Nausea Only and Other (See Comments)    Severe constipation, also  . Other Other (See Comments)    Lima beans- Tested allergic    Current Facility-Administered Medications  Medication Dose Route Frequency Provider Last Rate Last Dose  . ceFAZolin (ANCEF) IVPB 2g/100 mL premix  2 g Intravenous 30 min Pre-Op Grace Isaac, MD      . lactated ringers infusion   Intravenous Continuous Oleta Mouse, MD      . lidocaine (XYLOCAINE) 1 % (with pres) injection    PRN Grace Isaac, MD   10 mL at 07/16/18 1652    Pertinent items are noted in HPI.   Review of Systems:     Cardiac Review of Systems: [Y] = yes  or   [ N ] = no   Chest Pain [  y  ]  Resting SOB [  y ] Exertional SOB  Blue.Reese  ]  Orthopnea Blue.Reese  ]   Pedal Edema Florencio.Farrier   ]    Palpitations Florencio.Farrier  ] Syncope  [ n ]   Presyncope [  n ]   General Review of Systems: [Y] = yes [  ]=no Constitional: recent weight change Blue.Reese  ];  Wt loss over the last 3 months [ 4  ] anorexia [  ]; fatigue [  ]; nausea [  ]; night sweats [  ]; fever [ y ]; or chills [  ];           Eye : blurred vision [   ]; diplopia [   ]; vision changes [  ];  Amaurosis fugax[  ]; Resp: cough [  ];  wheezing[  ];  hemoptysis[  ]; shortness of breath[  ]; paroxysmal nocturnal dyspnea[  ]; dyspnea on exertion[  ]; or orthopnea[  ];  GI:  gallstones[  ], vomiting[  ];  dysphagia[  ]; melena[  ];  hematochezia [  ]; heartburn[  ];   Hx of  Colonoscopy[  ]; GU: kidney stones [  ]; hematuria[  ];   dysuria [  ];  nocturia[  ];  history of     obstruction [  ]; urinary frequency [  ]             Skin: rash, swelling[  ];, hair loss[  ];  peripheral edema[  ];  or itching[  ]; Musculosketetal: myalgias[  ];  joint swelling[  ];  joint erythema[  ];  joint pain[  ];  back pain[  ];  Heme/Lymph: bruising[  ];  bleeding[  ];  anemia[  ];  Neuro: TIA[  ];  headaches[  ];  stroke[  ];  vertigo[  ];  seizures[  ];   paresthesias[  ];  difficulty walking[  ];  Psych:depression[  ]; anxiety[  ];  Endocrine: diabetes[  ];  thyroid dysfunction[  ];  Immunizations: Flu up to date [ y ]; Pneumococcal up to date [ y ];  Other:     PHYSICAL EXAMINATION: BP (!) 154/68   Pulse (!) 116   Temp 98.5 F (36.9 C) (Oral)   Resp 20   Ht 5\' 6"  (1.676 m)   Wt 64.4 kg   SpO2 98%   BMI 22.92 kg/m  General appearance: alert, cooperative and no distress Head: Normocephalic, without obvious abnormality, atraumatic Neck: no adenopathy, no carotid bruit, no JVD, supple, symmetrical, trachea midline  and thyroid not enlarged, symmetric, no tenderness/mass/nodules Lymph nodes: Cervical, supraclavicular, and axillary nodes normal. Resp: diminished breath sounds RLL, RML and RUL Back: symmetric, no curvature. ROM normal. No CVA tenderness. Cardio: regular rate and rhythm, S1, S2 normal, no murmur, click, rub or gallop GI: soft, non-tender; bowel sounds normal; no masses,  no organomegaly Extremities: extremities normal, atraumatic, no cyanosis or edema and Homans sign is negative, no sign of DVT Neurologic: Grossly normal  Diagnostic  Studies & Laboratory data:     Recent Radiology Findings:  Dg Chest 2 View  Result Date: 07/16/2018 CLINICAL DATA:  Pleural effusion, shortness of Breath EXAM: CHEST - 2 VIEW COMPARISON:  07/11/2018 FINDINGS: Large right pleural effusion, increasing since prior study. Right lower lung atelectasis or infiltrate. Linear scarring at the left base. Otherwise no confluent opacities on the left. Heart is normal size. No effusion on the left. No acute bony abnormality. IMPRESSION: Large right pleural effusion, increasing since prior study with right lower lobe atelectasis or infiltrate. Electronically Signed   By: Rolm Baptise M.D.   On: 07/16/2018 11:17    Ct Chest W Contrast  Result Date: 07/03/2018 CLINICAL DATA:  Dyspnea. Thoracentesis today. Right pleural effusion. EXAM: CT CHEST WITH CONTRAST TECHNIQUE: Multidetector CT imaging of the chest was performed during intravenous contrast administration. CONTRAST:  76mL OMNIPAQUE IOHEXOL 300 MG/ML  SOLN COMPARISON:  Radiographs from 07/03/2018 FINDINGS: Cardiovascular: Unremarkable Mediastinum/Nodes: Mild deviation of the heart to the left by the large right basilar mass. The mass abuts the pleural margin and also the right anterolateral pericardial margin. No pericardial effusion. A right internal mammary node measures 0.5 cm in short axis on image 71/3. Lungs/Pleura: A right subpulmonic mass measures 16.4 by 12.2 by 13.0 cm (volume = 1360 cm^3), with the mass itself filling over half of the hemithorax. There is atelectatic right lower lobe and right middle lobe adjacent to the mass, and it is difficult to say for certain that the mass is not arising from the lung, although the subpulmonic location would favor a pleural mass. There is an associated small pleural effusion with some faint enhancement along the pleural effusion margins. The mass abuts and causes some mass effect on the right side of the heart. Upper Abdomen: I do not observe the mass to traverse  the diaphragm. There is some mild indistinctness along the medial limb left adrenal gland on image 145/3 which is nonspecific, and not definitively masslike. Musculoskeletal: No rib destruction is identified. No appreciable findings of osseous metastatic disease. IMPRESSION: 1. Very large (volume equals 1360 cubic cm) heterogeneously enhancing right subpulmonic mass. I tend to favor a pleural rather than a pulmonary parenchymal origin although this is not absolutely certain. Differential diagnostic considerations include malignant mesothelioma, lung cancer with pleural spread, other metastatic disease, pleural lymphoma, and solitary fibrous tumor of the pleura. Although I mention fibrous tumor of the pleura, the degree of heterogeneity and lobularity would be atypical for that entity. Thoracic surgical referral recommended. 2. Small to moderate right pleural effusion. Electronically Signed   By: Van Clines M.D.   On: 07/03/2018 18:53   I US Thoracentesis Asp Pleural Space W/img Guide  Result Date: 07/11/2018 INDICATION: 57 year old with a large right chest mass and recurrent right pleural effusion with shortness of breath. EXAM: ULTRASOUND GUIDED RIGHT THORACENTESIS MEDICATIONS: None. COMPLICATIONS: Self-limiting vasovagal episode PROCEDURE: An ultrasound guided thoracentesis was thoroughly discussed with the patient and questions answered. The benefits, risks, alternatives and complications were also discussed. The patient understands  and wishes to proceed with the procedure. Written consent was obtained. Ultrasound was performed to localize and mark an adequate pocket of fluid in the right chest. The area was then prepped and draped in the normal sterile fashion. 1% Lidocaine was used for local anesthesia. Under ultrasound guidance a 6 Fr Safe-T-Centesis catheter was introduced. Thoracentesis was performed. Brief vasovagal episode during drainage of the pleural fluid. Patient said that she was  getting dizzy. Systolic blood pressure was 64. As a result, the patient was switched from a upright sitting position to a left lateral decubitus position. Within a few minutes, the patient's symptoms resolved and blood pressure returned to baseline. After the blood pressure returned to baseline, the pleural fluid drainage was continued to completion. The catheter was removed and a dressing applied. FINDINGS: A total of approximately 1.5 L of amber colored fluid was removed. Samples were sent to the laboratory as requested by the clinical team. IMPRESSION: Successful ultrasound guided right thoracentesis yielding 1.5 L of pleural fluid. Electronically Signed   By: Markus Daft M.D.   On: 07/11/2018 16:55     I have independently reviewed the above radiology studies  and reviewed the findings with the patient.   Recent Lab Findings: Lab Results  Component Value Date   WBC 11.9 (H) 07/11/2018   HGB 10.0 (L) 07/11/2018   HCT 32.8 (L) 07/11/2018   PLT 542 (H) 07/11/2018   GLUCOSE 98 07/11/2018   ALT 27 07/11/2018   AST 27 07/11/2018   NA 135 07/11/2018   K 3.2 (L) 07/11/2018   CL 98 07/11/2018   CREATININE 0.68 07/11/2018   BUN 8 07/11/2018   CO2 25 07/11/2018   INR 1.03 07/11/2018      Assessment / Plan:   16 cm right lower chest mass, heterogeneous in consistency with associated pleural effusion recurrent, with benign cytology.  The patient comes to the office today 6 significantly symptomatic with increasing pleural effusion with shortness of breath difficulty laying down and cough.  I discussed with her proceeding with placement of a right Pleurx catheter for symptomatic relief.  Then arrange as discussed at multidisciplinary thoracic oncology conference this morning quickly proceeding with needle biopsy of the right pleural mass to obtain a tissue diagnosis.  Sufficient material for flow cytometry will need to be done as this potentially could be the result of a pleural lymphoma.  We are  making arrangements currently to have the Pleurx placed late this afternoon to obtain quick symptomatic relief.    The goals risks and alternatives of the planned surgical procedure Procedure(s): INSERTION PLEURAL DRAINAGE CATHETER, right (Right)  have been discussed with the patient in detail. The risks of the procedure including death, infection, stroke, myocardial infarction, bleeding, blood transfusion have all been discussed specifically.  I have quoted Inocencio Homes a1 % of perioperative mortality and a complication rate as high as 10 %. The patient's questions have been answered.DIV[facility] IMBER is willing  to proceed with the planned procedure.   Grace Isaac MD      [address] [place], [address] Office [phone]   Beeper [phone]  07/16/2018 4:55 PM

## 2018-07-17 ENCOUNTER — Encounter (HOSPITAL_COMMUNITY): Payer: Self-pay | Admitting: Cardiothoracic Surgery

## 2018-07-17 NOTE — Op Note (Signed)
NAME: Brenda Patel, Brenda Patel MEDICAL RECORD [id number] ACCOUNT [id number] DATE OF BIRTH:1961/07/07 FACILITY: [facility] LOCATION: [place], [place]  OPERATIVE REPORT  DATE OF PROCEDURE:  07/16/2018  PREOPERATIVE DIAGNOSIS:  Recurrent right pleural effusion with large right chest mass.  POSTOPERATIVE DIAGNOSIS:  Recurrent right pleural effusion with large right chest mass.  SURGICAL PROCEDURE:  Drainage of right pleural effusion, placement of PleurX catheter with fluoroscopic and ultrasound guidance with MAC anesthesia.  SURGEON:  Lanelle Bal, MD  BRIEF HISTORY:  The patient is a 57 year old female who has had increasing cough over several months and was treated for respiratory infection.  At one point a chest x-ray was obtained that showed large right pleural effusion.  She has had a  thoracentesis done on 2 occasions draining more than 1000 mL.  CT scan of the chest shows a 16 cm right lower chest mass.  No tissue diagnosis has been obtained yet.  Cytologies from the previous thoracentesis showed no evidence of malignancy.  The  patient presented to the office the day of surgery for evaluation of the recurrent effusion.  She was significantly symptomatic having trouble lying flat even during exam.  She notes that since the last thoracentesis, the symptoms have again worsened.   Chest x-ray done in the office confirmed a large right pleural effusion.  We recommended to the patient that we proceed with drainage of the right pleural fluid for symptomatic relief to obtain time to get a CT-directed needle biopsy of the large mass.   Radiographically it is unclear what the mass may be but it is suggestive of possible lymphoma.  Risks of the procedure discussed with the patient.  She was agreeable with proceeding.  DESCRIPTION OF PROCEDURE:  With the right side preoperatively marked, the patient was taken to the operating room slightly sitting upright.  MAC anesthesia was  administered.  Right chest was prepped with Betadine and draped in a sterile manner.   Appropriate timeout was performed.  We first confirmed the area of pleural fluid with ultrasound.  Then, 1% lidocaine was infiltrated along the lateral chest wall approximately sixth intercostal space.  A 16-gauge needle was introduced into the right  chest and under fluoroscopic guidance a guidewire was placed into the pleural space.  A more anterior counter incision was made.  The PleurX catheter tunneled.  Dilator was placed over the wire and then a peel-away sheath was placed into the right chest.   A PleurX catheter was guided over the wire and positioned into the chest.  We then drained 1800 mL of straw-colored pleural fluid.  This fluid was sent for pathologic examination.  The catheter was secured in place.  A small incision over the puncture  site was closed with interrupted subcuticular stitch.  Dermabond was applied.  Dressings were applied.  The patient tolerated the procedure without obvious complication and transferred to the recovery room for postoperative care.  Sponge and needle count  was reported as correct at the end of the completion.  At the end of the case fluoroscopic examination of the chest showed no evidence of pneumothorax and good position of the catheter.  TN/NUANCE  D:07/17/2018 T:07/17/2018 JOB:[id number]

## 2018-07-18 DIAGNOSIS — Z4803 Encounter for change or removal of drains: Secondary | ICD-10-CM

## 2018-07-18 DIAGNOSIS — J90 Pleural effusion, not elsewhere classified: Secondary | ICD-10-CM | POA: Diagnosis not present

## 2018-07-19 NOTE — Anesthesia Postprocedure Evaluation (Signed)
Anesthesia Post Note  Patient: Brenda Patel  Procedure(s) Performed: INSERTION PLEURAL DRAINAGE CATHETER, right (Right )     Patient location during evaluation: PACU Anesthesia Type: MAC Level of consciousness: awake and alert Pain management: pain level controlled Vital Signs Assessment: post-procedure vital signs reviewed and stable Respiratory status: spontaneous breathing, nonlabored ventilation, respiratory function stable and patient connected to nasal cannula oxygen Cardiovascular status: stable and blood pressure returned to baseline Postop Assessment: no apparent nausea or vomiting Anesthetic complications: no    Last Vitals:  Vitals:   07/16/18 1835 07/16/18 1910  BP: 122/75 118/74  Pulse: 100 98  Resp: 19 16  Temp: 36.5 C (!) 36.4 C  SpO2: 94% 94%    Last Pain:  Vitals:   07/16/18 1828  TempSrc:   PainSc: 2                  Elfreida Heggs

## 2018-07-20 ENCOUNTER — Telehealth: Payer: Self-pay | Admitting: Medical Oncology

## 2018-07-20 ENCOUNTER — Telehealth: Payer: Self-pay

## 2018-07-20 NOTE — Telephone Encounter (Signed)
Margie with [facility] contacted the office [phone] and stated Ms. Skoog's pleurx was not drained on Sunday.  Patient stated she was not feeling well.

## 2018-07-20 NOTE — Telephone Encounter (Signed)
First available

## 2018-07-20 NOTE — Telephone Encounter (Signed)
Margie, nurse with [facility] contacted the office again to get verbal orders for start of care for 1 time a day for 5 days.  Verbal orders given.  Patient will have someone from [facility] out today to have her PleurX drained.

## 2018-07-20 NOTE — Telephone Encounter (Signed)
Biopsy scheduled 27th . Cancel appts with Julien Nordmann on same day. Schedule message sent .

## 2018-07-20 NOTE — Telephone Encounter (Signed)
Patient contacted the office and stated [facility] stated they would not be out to see her today due to physician signature not provided on paperwork done over the weekend.  Ocie Cornfield, nurse with patient over the weekend who stated that yes, the paperwork does need to be signed, however, patient should have had a nurse come out.  She advised that she would contact [facility] office to make sure patient had someone to come out today to drain her.  I also made the patient aware that it is important for patient to be drained daily as Pleurx is newly placed and the last drain was 1000 ml's which is the max drainage.  She acknowledged receipt.

## 2018-07-20 NOTE — Telephone Encounter (Signed)
Please ignore Mohamed's  message -It was meant just for me and Armanda Magic

## 2018-07-21 ENCOUNTER — Telehealth: Payer: Self-pay | Admitting: *Deleted

## 2018-07-21 ENCOUNTER — Other Ambulatory Visit: Payer: Self-pay | Admitting: Medical Oncology

## 2018-07-21 ENCOUNTER — Other Ambulatory Visit: Payer: Self-pay | Admitting: Radiology

## 2018-07-21 DIAGNOSIS — R918 Other nonspecific abnormal finding of lung field: Secondary | ICD-10-CM

## 2018-07-21 NOTE — Telephone Encounter (Signed)
Oncology Nurse Navigator Documentation  Oncology Nurse Navigator Flowsheets 07/21/2018  Navigator Location [facility]  Navigator Encounter Type Telephone;Other/I followed up on Ms. Whitham schedule and updated Dr. Julien Nordmann.  He would like to see her Friday instead of Wednesday due to biopsy on Wednesday.  I called her and change appt.  She verbalized understanding of appt time and place.  Telephone Outgoing Call  Confirmed Diagnosis Date 07/22/2018  Treatment Phase Abnormal Scans  Barriers/Navigation Needs Education;Coordination of Care  Education Other  Interventions Coordination of Care;Education  Coordination of Care Appts  Education Method Verbal  Acuity Level 2  Time Spent with Patient 30

## 2018-07-22 ENCOUNTER — Inpatient Hospital Stay: Payer: BC Managed Care – PPO

## 2018-07-22 ENCOUNTER — Ambulatory Visit (HOSPITAL_COMMUNITY): Admission: RE | Admit: 2018-07-22 | Payer: BC Managed Care – PPO | Source: Ambulatory Visit

## 2018-07-22 ENCOUNTER — Inpatient Hospital Stay: Payer: BC Managed Care – PPO | Admitting: Internal Medicine

## 2018-07-22 ENCOUNTER — Encounter (HOSPITAL_COMMUNITY): Payer: Self-pay

## 2018-07-22 ENCOUNTER — Ambulatory Visit (HOSPITAL_COMMUNITY)
Admission: RE | Admit: 2018-07-22 | Discharge: 2018-07-22 | Disposition: A | Discharge status: Home / Self Care | Payer: BC Managed Care – PPO | Source: Ambulatory Visit | Attending: Diagnostic Radiology | Admitting: Diagnostic Radiology

## 2018-07-22 ENCOUNTER — Ambulatory Visit (HOSPITAL_COMMUNITY)
Admission: RE | Admit: 2018-07-22 | Discharge: 2018-07-22 | Disposition: A | Discharge status: Home / Self Care | Payer: BC Managed Care – PPO | Source: Ambulatory Visit | Attending: Cardiothoracic Surgery | Admitting: Cardiothoracic Surgery

## 2018-07-22 ENCOUNTER — Other Ambulatory Visit: Payer: Self-pay

## 2018-07-22 DIAGNOSIS — J90 Pleural effusion, not elsewhere classified: Secondary | ICD-10-CM | POA: Diagnosis present

## 2018-07-22 DIAGNOSIS — Z9889 Other specified postprocedural states: Secondary | ICD-10-CM

## 2018-07-22 LAB — CBC
HEMATOCRIT: 34.2 % — AB (ref 36.0–46.0)
HEMOGLOBIN: 10.2 g/dL — AB (ref 12.0–15.0)
MCH: 24.5 pg — AB (ref 26.0–34.0)
MCHC: 29.8 g/dL — AB (ref 30.0–36.0)
MCV: 82.2 fL (ref 80.0–100.0)
Platelets: 557 10*3/uL — ABNORMAL HIGH (ref 150–400)
RBC: 4.16 MIL/uL (ref 3.87–5.11)
RDW: 14.2 % (ref 11.5–15.5)
WBC: 11.6 10*3/uL — ABNORMAL HIGH (ref 4.0–10.5)
nRBC: 0 % (ref 0.0–0.2)

## 2018-07-22 LAB — PROTIME-INR
INR: 1.14
PROTHROMBIN TIME: 14.5 s (ref 11.4–15.2)

## 2018-07-22 MED ORDER — FENTANYL CITRATE (PF) 100 MCG/2ML IJ SOLN
INTRAMUSCULAR | Status: AC
  Filled 2018-07-22: qty 2

## 2018-07-22 MED ORDER — MIDAZOLAM HCL 2 MG/2ML IJ SOLN
INTRAMUSCULAR | Status: AC
  Filled 2018-07-22: qty 2

## 2018-07-22 MED ORDER — FENTANYL CITRATE (PF) 100 MCG/2ML IJ SOLN
INTRAMUSCULAR | Status: AC | PRN
  Administered 2018-07-22 (×2): 25 ug via INTRAVENOUS

## 2018-07-22 MED ORDER — LIDOCAINE HCL 1 % IJ SOLN
INTRAMUSCULAR | Status: AC
  Filled 2018-07-22: qty 20

## 2018-07-22 MED ORDER — MIDAZOLAM HCL 2 MG/2ML IJ SOLN
INTRAMUSCULAR | Status: AC | PRN
  Administered 2018-07-22 (×2): 0.5 mg via INTRAVENOUS

## 2018-07-22 MED ORDER — SODIUM CHLORIDE 0.9 % IV SOLN: INTRAVENOUS | Status: DC

## 2018-07-22 NOTE — H&P (Signed)
Chief Complaint: Patient was seen in consultation today for right pleural based mass biopsy at the request of Gerhardt,Edward B  Referring Physician(s): Grace Isaac  Supervising Physician: Markus Daft  Patient Status: [facility] - Out-pt  History of Present Illness: Brenda Patel is a 57 y.o. female   Started with cough in Sept Worsened and persisted; treated without relief After cataract surgery--- even more so  CT 11/8: IMPRESSION: 1. Very large (volume equals 1360 cubic cm) heterogeneously enhancing right subpulmonic mass. I tend to favor a pleural rather than a pulmonary parenchymal origin although this is not absolutely certain. Differential diagnostic considerations include malignant mesothelioma, lung cancer with pleural spread, other metastatic disease, pleural lymphoma, and solitary fibrous tumor of the pleura. Although I mention fibrous tumor of the pleura, the degree of heterogeneity and lobularity would be atypical for that entity. Thoracic surgical referral recommended. 2. Small to moderate right pleural effusion.  2 thoracentesis- negative results  Right pleurX cath placed by Dr Servando Snare 07/16/18 Last drain yesterday 1 L  Now scheduled for right pleural mass biopsy  Past Medical History:  Diagnosis Date  . Asthma   . Depression   . Dyspnea   . Pleural effusion on right   . Pleural mass     Past Surgical History:  Procedure Laterality Date  . CHEST TUBE INSERTION Right 07/16/2018   Procedure: INSERTION PLEURAL DRAINAGE CATHETER, right;  Surgeon: Grace Isaac, MD;  Location: [place];  Service: Thoracic;  Laterality: Right;  . COLONOSCOPY  2017  . GUM SURGERY  1984   GRAFTS  . IR THORACENTESIS ASP PLEURAL SPACE W/IMG GUIDE  07/03/2018    Allergies: Codeine and Other  Medications: Prior to Admission medications   Medication Sig Start Date End Date Taking? Authorizing Provider  acetaminophen (TYLENOL) 500 MG tablet Take 500-1,000 mg by  mouth every 6 (six) hours as needed (for pain, discomfort, or fever).   Yes [provider]  albuterol (PROAIR HFA) 108 (90 Base) MCG/ACT inhaler Inhale 2 puffs into the lungs every 6 (six) hours as needed for wheezing or shortness of breath.   Yes [provider]  Calcium Citrate 333 MG TABS Take 333 mg by mouth daily.   Yes [provider]  [employer] 12 HOUR NASAL DECONGESTANT 120 MG 12 hr tablet Take 120 mg by mouth 2 (two) times daily. 05/31/18  Yes [provider]  ferrous sulfate 325 (65 FE) MG tablet Take 1 tablet (325 mg total) by mouth 2 (two) times daily with a meal. 07/04/18  Yes Lavina Hamman, MD  fluticasone (FLONASE) 50 MCG/ACT nasal spray Place 1 spray into both nostrils 2 (two) times daily.  05/05/18  Yes [provider]  loratadine (CLARITIN) 10 MG tablet Take 10 mg by mouth daily.   Yes [provider]  montelukast (SINGULAIR) 10 MG tablet Take 10 mg by mouth daily after supper. 06/07/18  Yes [provider]  nefazodone (SERZONE) 100 MG tablet Take 100 mg by mouth 2 (two) times daily. 06/07/18  Yes [provider]  traMADol (ULTRAM) 50 MG tablet Take 50-100 mg by mouth every 6 (six) hours as needed (for pain). 1-2 TABLET AS NEEDED EVERY 6 HRS AS NEEDED FOR PAIN ORALLY 30 DAYS 03/21/17  Yes [provider]  traMADol (ULTRAM) 50 MG tablet Take 1 tablet (50 mg total) by mouth every 6 (six) hours as needed. 07/16/18 07/16/19 Yes Grace Isaac, MD  meloxicam (MOBIC) 15 MG tablet Take 15 mg  by mouth daily as needed for pain.  05/17/18   [provider]     History reviewed. No pertinent family history.  Social History   Socioeconomic History  . Marital status: Single    Spouse name: Not on file  . Number of children: Not on file  . Years of education: Not on file  . Highest education level: Not on file  Occupational History  . Not on file  Social Needs  . Financial resource strain: Not on file   . Food insecurity:    Worry: Not on file    Inability: Not on file  . Transportation needs:    Medical: Not on file    Non-medical: Not on file  Tobacco Use  . Smoking status: Never Smoker  . Smokeless tobacco: Never Used  Substance and Sexual Activity  . Alcohol use: Yes    Frequency: Never    Comment: 3-4 glasses of wine per month/liquor 1x monthly  . Drug use: Never  . Sexual activity: Not on file  Lifestyle  . Physical activity:    Days per week: 5 days    Minutes per session: Not on file  . Stress: Not on file  Relationships  . Social connections:    Talks on phone: Not on file    Gets together: Not on file    Attends religious service: Not on file    Active member of club or organization: Not on file    Attends meetings of clubs or organizations: Not on file    Relationship status: Not on file  Other Topics Concern  . Not on file  Social History Narrative  . Not on file    Review of Systems: A 12 point ROS discussed and pertinent positives are indicated in the HPI above.  All other systems are negative.  Review of Systems  Constitutional: Positive for activity change and fatigue. Negative for fever.  Respiratory: Positive for cough and shortness of breath.   Cardiovascular: Negative for chest pain.  Neurological: Positive for weakness.  Psychiatric/Behavioral: Negative for behavioral problems and confusion.    Vital Signs: BP 111/72   Pulse (!) 115   Temp 98.3 F (36.8 C)   Resp 20   Ht 5\' 11"  (1.803 m)   Wt 141 lb (64 kg)   SpO2 98%   BMI 19.67 kg/m   Physical Exam  Constitutional: She is oriented to person, place, and time.  Cardiovascular: Normal rate, regular rhythm and normal heart sounds.  Pulmonary/Chest: Effort normal.  Right decreased sounds  Abdominal: Soft. Bowel sounds are normal.  Musculoskeletal: Normal range of motion.  Neurological: She is alert and oriented to person, place, and time.  Skin: Skin is warm and dry.  Psychiatric:  She has a normal mood and affect. Her behavior is normal. Judgment and thought content normal.  Vitals reviewed.   Imaging: Dg Chest 1 View  Result Date: 07/11/2018 CLINICAL DATA:  Post right thoracentesis.  07/11/2018 EXAM: CHEST  1 VIEW COMPARISON:  07/11/2018 FINDINGS: Large right effusion, decreased in size since prior study. No pneumothorax. Right right lower lobe atelectasis or infiltrate. Left lung clear. IMPRESSION: Large right effusion, decreased following thoracentesis. No pneumothorax. Electronically Signed   By: Rolm Baptise M.D.   On: 07/11/2018 16:15   Dg Chest 1 View  Result Date: 07/03/2018 CLINICAL DATA:  Status post right-sided thoracentesis. EXAM: CHEST  1 VIEW COMPARISON:  Radiograph of same day. FINDINGS: The heart size and mediastinal contours are within  normal limits. Left lung is clear. Large right pleural effusion is noted which is slightly decreased compared to prior exam. No pneumothorax is noted. The visualized skeletal structures are unremarkable. IMPRESSION: Right pleural effusion is slightly decreased compared to prior exam. No pneumothorax is noted. Electronically Signed   By: Marijo Conception, M.D.   On: 07/03/2018 15:44   Dg Chest 2 View  Result Date: 07/16/2018 CLINICAL DATA:  Pleural effusion, shortness of Breath EXAM: CHEST - 2 VIEW COMPARISON:  07/11/2018 FINDINGS: Large right pleural effusion, increasing since prior study. Right lower lung atelectasis or infiltrate. Linear scarring at the left base. Otherwise no confluent opacities on the left. Heart is normal size. No effusion on the left. No acute bony abnormality. IMPRESSION: Large right pleural effusion, increasing since prior study with right lower lobe atelectasis or infiltrate. Electronically Signed   By: Rolm Baptise M.D.   On: 07/16/2018 11:17   Dg Chest 2 View  Result Date: 07/11/2018 CLINICAL DATA:  Pleural effusion. EXAM: CHEST - 2 VIEW COMPARISON:  07/03/2018 FINDINGS: Interval progression of  right pleural effusion now large in character. No evidence for left pleural effusion. The cardiopericardial silhouette is within normal limits for size. The visualized bony structures of the thorax are intact. Telemetry leads overlie the chest. IMPRESSION: Interval progression of right pleural effusion, now large in size. Electronically Signed   By: Misty Stanley M.D.   On: 07/11/2018 14:33   Dg Chest 2 View  Result Date: 07/03/2018 CLINICAL DATA:  Short of breath and cough for 2 weeks. History of asthma. EXAM: CHEST - 2 VIEW COMPARISON:  None. FINDINGS: A large pleural effusion occupies at least 60% of the right hemithorax. Cannot exclude underlying pneumonia. Possible irregular pleural thickening versus fluid at the right apex. Left lung is hyperexpanded but clear. No left pleural effusion. No pneumothorax on either side. Cardiac silhouette is normal in size. No mediastinal or left hilar masses. Right hilum is obscured by the contiguous pleural effusion. Skeletal structures are intact. IMPRESSION: 1. Large right pleural effusion. There is a possible underlying pneumonia versus atelectasis. Cannot exclude an underlying mass. Recommend follow-up chest CT with contrast for further assessment. 2. No evidence of pulmonary edema. Electronically Signed   By: Lajean Manes M.D.   On: 07/03/2018 13:04   Ct Chest W Contrast  Result Date: 07/03/2018 CLINICAL DATA:  Dyspnea. Thoracentesis today. Right pleural effusion. EXAM: CT CHEST WITH CONTRAST TECHNIQUE: Multidetector CT imaging of the chest was performed during intravenous contrast administration. CONTRAST:  48mL OMNIPAQUE IOHEXOL 300 MG/ML  SOLN COMPARISON:  Radiographs from 07/03/2018 FINDINGS: Cardiovascular: Unremarkable Mediastinum/Nodes: Mild deviation of the heart to the left by the large right basilar mass. The mass abuts the pleural margin and also the right anterolateral pericardial margin. No pericardial effusion. A right internal mammary node measures  0.5 cm in short axis on image 71/3. Lungs/Pleura: A right subpulmonic mass measures 16.4 by 12.2 by 13.0 cm (volume = 1360 cm^3), with the mass itself filling over half of the hemithorax. There is atelectatic right lower lobe and right middle lobe adjacent to the mass, and it is difficult to say for certain that the mass is not arising from the lung, although the subpulmonic location would favor a pleural mass. There is an associated small pleural effusion with some faint enhancement along the pleural effusion margins. The mass abuts and causes some mass effect on the right side of the heart. Upper Abdomen: I do not observe the mass to traverse  the diaphragm. There is some mild indistinctness along the medial limb left adrenal gland on image 145/3 which is nonspecific, and not definitively masslike. Musculoskeletal: No rib destruction is identified. No appreciable findings of osseous metastatic disease. IMPRESSION: 1. Very large (volume equals 1360 cubic cm) heterogeneously enhancing right subpulmonic mass. I tend to favor a pleural rather than a pulmonary parenchymal origin although this is not absolutely certain. Differential diagnostic considerations include malignant mesothelioma, lung cancer with pleural spread, other metastatic disease, pleural lymphoma, and solitary fibrous tumor of the pleura. Although I mention fibrous tumor of the pleura, the degree of heterogeneity and lobularity would be atypical for that entity. Thoracic surgical referral recommended. 2. Small to moderate right pleural effusion. Electronically Signed   By: Van Clines M.D.   On: 07/03/2018 18:53   Dg Chest Port 1 View  Result Date: 07/16/2018 CLINICAL DATA:  Chest tube in place. EXAM: PORTABLE CHEST 1 VIEW COMPARISON:  07/16/2018 FINDINGS: New right-sided chest tube projects over the right lung apex with interval decrease in moderate to large pleural effusion more so along the periphery and upper right thorax. At least half  of the right hemithorax is still opacified by pleural fluid and pulmonary consolidation. Left lung is hyperinflated but clear. No acute osseous abnormality. IMPRESSION: Some interval decrease in right-sided pleural fluid is noted along the right lung apex and periphery of the right upper lobe. At least half of the right hemithorax is still opacified by presumed fluid and pulmonary consolidation along its caudal half. Electronically Signed   By: Ashley Royalty M.D.   On: 07/16/2018 19:08   Dg C-arm 1-60 Min-no Report  Result Date: 07/16/2018 Fluoroscopy was utilized by the requesting physician.  No radiographic interpretation.   Ir Thoracentesis Asp Pleural Space W/img Guide  Result Date: 07/03/2018 INDICATION: Shortness of breath. Large right pleural effusion noted on chest x-ray. Request diagnostic and therapeutic thoracentesis. EXAM: ULTRASOUND GUIDED RIGHT THORACENTESIS MEDICATIONS: None. COMPLICATIONS: None immediate. PROCEDURE: An ultrasound guided thoracentesis was thoroughly discussed with the patient and questions answered. The benefits, risks, alternatives and complications were also discussed. The patient understands and wishes to proceed with the procedure. Written consent was obtained. Ultrasound was performed to localize and mark an adequate pocket of fluid in the right chest. The area was then prepped and draped in the normal sterile fashion. 1% Lidocaine was used for local anesthesia. Under ultrasound guidance a 6 Fr Safe-T-Centesis catheter was introduced. Thoracentesis was performed. The catheter was removed and a dressing applied. FINDINGS: A total of approximately 1.5 L of hazy, serosanguineous fluid was removed. Samples were sent to the laboratory as requested by the clinical team. IMPRESSION: Successful ultrasound guided right thoracentesis yielding 1.5 L of pleural fluid. Read by: Ascencion Dike PA-C Electronically Signed   By: Jacqulynn Cadet M.D.   On: 07/03/2018 15:56   US  Thoracentesis Asp Pleural Space W/img Guide  Result Date: 07/11/2018 INDICATION: 57 year old with a large right chest mass and recurrent right pleural effusion with shortness of breath. EXAM: ULTRASOUND GUIDED RIGHT THORACENTESIS MEDICATIONS: None. COMPLICATIONS: Self-limiting vasovagal episode PROCEDURE: An ultrasound guided thoracentesis was thoroughly discussed with the patient and questions answered. The benefits, risks, alternatives and complications were also discussed. The patient understands and wishes to proceed with the procedure. Written consent was obtained. Ultrasound was performed to localize and mark an adequate pocket of fluid in the right chest. The area was then prepped and draped in the normal sterile fashion. 1% Lidocaine was used for local anesthesia.  Under ultrasound guidance a 6 Fr Safe-T-Centesis catheter was introduced. Thoracentesis was performed. Brief vasovagal episode during drainage of the pleural fluid. Patient said that she was getting dizzy. Systolic blood pressure was 64. As a result, the patient was switched from a upright sitting position to a left lateral decubitus position. Within a few minutes, the patient's symptoms resolved and blood pressure returned to baseline. After the blood pressure returned to baseline, the pleural fluid drainage was continued to completion. The catheter was removed and a dressing applied. FINDINGS: A total of approximately 1.5 L of amber colored fluid was removed. Samples were sent to the laboratory as requested by the clinical team. IMPRESSION: Successful ultrasound guided right thoracentesis yielding 1.5 L of pleural fluid. Electronically Signed   By: Markus Daft M.D.   On: 07/11/2018 16:55    Labs:  CBC: Recent Labs    07/03/18 1339 07/04/18 0257 07/11/18 1344  WBC 14.5* 12.2* 11.9*  HGB 11.0* 10.5* 10.0*  HCT 37.4 34.0* 32.8*  PLT 546* 453* 542*    COAGS: Recent Labs    07/11/18 1344 07/16/18 1628  INR 1.03  --   APTT  --   33    BMP: Recent Labs    07/03/18 1339 07/04/18 0836 07/11/18 1344  NA 134* 136 135  K 3.8 3.7 3.2*  CL 98 100 98  CO2 27 27 25   GLUCOSE 108* 110* 98  BUN 10 8 8   CALCIUM 9.0 8.5* 8.9  CREATININE 0.71 0.75 0.68  GFRNONAA >60 >60 >60  GFRAA >60 >60 >60    LIVER FUNCTION TESTS: Recent Labs    07/03/18 1858 07/11/18 1344  BILITOT 0.4 0.4  AST 27 27  ALT 18 27  ALKPHOS 105 111  PROT 7.4 6.9  ALBUMIN 3.2* 2.8*    TUMOR MARKERS: No results for input(s): AFPTM, CEA, CA199, CHROMGRNA in the last 8760 hours.  Assessment and Plan:  Large right pleural based mass - for biopsy today Thoracentesis x 2-- neg Rt PleurX catheter in place-- last drain yesterday 1 L Risks and benefits discussed with the patient including, but not limited to bleeding, hemoptysis, respiratory failure requiring intubation, infection, pneumothorax requiring chest tube placement, stroke from air embolism or even death.  All of the patient's questions were answered, patient is agreeable to proceed. Consent signed and in chart.  Thank you for this interesting consult.  I greatly enjoyed meeting MORRISON MASSER and look forward to participating in their care.  A copy of this report was sent to the requesting provider on this date.  Electronically Signed: Lavonia Drafts, PA-C 07/22/2018, 9:48 AM   I spent a total of  30 Minutes   in face to face in clinical consultation, greater than 50% of which was counseling/coordinating care for right pleural based mass bx

## 2018-07-22 NOTE — Progress Notes (Signed)
CXR done with no pneumothorax noted.  Pt has no evidence of SOB, resp. Even and regular.

## 2018-07-22 NOTE — Discharge Instructions (Addendum)
Needle Biopsy of the Lung, Care After °This sheet gives you information about how to care for yourself after your procedure. Your health care provider may also give you more specific instructions. If you have problems or questions, contact your health care provider. °What can I expect after the procedure? °After the procedure, it is common to have: °· Soreness, pain, and tenderness where a tissue sample was taken (biopsy site). °· A cough. °· A sore throat. ° °Follow these instructions at home: °Biopsy site care °· Follow instructions from your health care provider about when to remove the bandage that was placed on the biopsy site. °· Keep the bandage dry until it has been removed. °· Check your biopsy site every day for signs of infection. Check for: °? More redness, swelling, or pain. °? More fluid or blood. °? Warmth to the touch. °? Pus or a bad smell. °General instructions °· Rest as directed by your health care provider. Ask your health care provider what activities are safe for you. °· Do not take baths, swim, or use a hot tub until your health care provider approves. °· Take over-the-counter and prescription medicines only as told by your health care provider. °· If you have airplane travel scheduled, talk with your health care provider about when it is safe for you to travel by airplane. °· It is up to you to get the results of your procedure. Ask your health care provider, or the department that is doing the procedure, when your results will be ready. °· Keep all follow-up visits as told by your health care provider. This is important. °Contact a health care provider if: °· You have more redness, swelling, or pain around your biopsy site. °· You have more fluid or blood coming from your biopsy site. °· Your biopsy site feels warm to the touch. °· You have pus or a bad smell coming from your biopsy site. °· You have a fever. °· You have pain that does not get better with medicine. °Get help right away  if: °· You have problems breathing. °· You have chest pain. °· You cough up blood. °· You faint. °· You have a fast heart rate. °Summary °· After a needle biopsy of the lung, it is common to have a cough, a sore throat, or soreness, pain, and tenderness where a tissue sample was taken (biopsy site). °· You should check your biopsy area every day for signs of infection, including pus or a bad smell, warmth, more fluid or blood, or more redness, swelling, or pain. °· You should not take baths, swim, or use a hot tub until your health care provider approves. °· It is up to you to get the results of your procedure. Ask your health care provider, or the department that is doing the procedure, when your results will be ready. °This information is not intended to replace advice given to you by your health care provider. Make sure you discuss any questions you have with your health care provider. °Document Released: 06/09/2007 Document Revised: 07/03/2016 Document Reviewed: 07/03/2016 °Elsevier Interactive Patient Education © 2017 [employer] ° °Moderate Conscious Sedation, Adult, Care After °These instructions provide you with information about caring for yourself after your procedure. Your health care provider may also give you more specific instructions. Your treatment has been planned according to current medical practices, but problems sometimes occur. Call your health care provider if you have any problems or questions after your procedure. °What can I expect after the   procedure? °After your procedure, it is common: °· To feel sleepy for several hours. °· To feel clumsy and have poor balance for several hours. °· To have poor judgment for several hours. °· To vomit if you eat too soon. ° °Follow these instructions at home: °For at least 24 hours after the procedure: ° °· Do not: °? Participate in activities where you could fall or become injured. °? Drive. °? Use heavy machinery. °? Drink alcohol. °? Take sleeping  pills or medicines that cause drowsiness. °? Make important decisions or sign legal documents. °? Take care of children on your own. °· Rest. °Eating and drinking °· Follow the diet recommended by your health care provider. °· If you vomit: °? Drink water, juice, or soup when you can drink without vomiting. °? Make sure you have little or no nausea before eating solid foods. °General instructions °· Have a responsible adult stay with you until you are awake and alert. °· Take over-the-counter and prescription medicines only as told by your health care provider. °· If you smoke, do not smoke without supervision. °· Keep all follow-up visits as told by your health care provider. This is important. °Contact a health care provider if: °· You keep feeling nauseous or you keep vomiting. °· You feel light-headed. °· You develop a rash. °· You have a fever. °Get help right away if: °· You have trouble breathing. °This information is not intended to replace advice given to you by your health care provider. Make sure you discuss any questions you have with your health care provider. °Document Released: 06/02/2013 Document Revised: 01/15/2016 Document Reviewed: 12/02/2015 °Elsevier Interactive Patient Education © 2018 [employer] ° °

## 2018-07-22 NOTE — Procedures (Signed)
  Pre-operative Diagnosis: Right pleural mass        Post-operative Diagnosis: Right pleural mass   Indications: Needs tissue diagnosis  Procedure: CT biopsy  Findings: Large pleural mass.  3 - 18 gauge cores obtained.  Complications: None     EBL: Minimal  Plan: CXR in 1 hour and anticipate discharge in 2 hours.

## 2018-07-24 ENCOUNTER — Inpatient Hospital Stay: Payer: BC Managed Care – PPO

## 2018-07-24 ENCOUNTER — Encounter: Payer: Self-pay | Admitting: Internal Medicine

## 2018-07-24 ENCOUNTER — Encounter: Payer: Self-pay | Admitting: *Deleted

## 2018-07-24 ENCOUNTER — Inpatient Hospital Stay: Payer: BC Managed Care – PPO | Attending: Internal Medicine | Admitting: Internal Medicine

## 2018-07-24 VITALS — BP 117/68 | HR 119 | Temp 98.0°F | Resp 18 | Ht 71.0 in | Wt 140.4 lb

## 2018-07-24 DIAGNOSIS — R918 Other nonspecific abnormal finding of lung field: Secondary | ICD-10-CM | POA: Diagnosis present

## 2018-07-24 DIAGNOSIS — J90 Pleural effusion, not elsewhere classified: Secondary | ICD-10-CM | POA: Insufficient documentation

## 2018-07-24 DIAGNOSIS — Z79899 Other long term (current) drug therapy: Secondary | ICD-10-CM | POA: Insufficient documentation

## 2018-07-24 DIAGNOSIS — Z791 Long term (current) use of non-steroidal anti-inflammatories (NSAID): Secondary | ICD-10-CM | POA: Insufficient documentation

## 2018-07-24 DIAGNOSIS — R911 Solitary pulmonary nodule: Secondary | ICD-10-CM

## 2018-07-24 DIAGNOSIS — R5383 Other fatigue: Secondary | ICD-10-CM | POA: Insufficient documentation

## 2018-07-24 DIAGNOSIS — Z820 Family history of epilepsy and other diseases of the nervous system: Secondary | ICD-10-CM | POA: Diagnosis not present

## 2018-07-24 DIAGNOSIS — J948 Other specified pleural conditions: Secondary | ICD-10-CM

## 2018-07-24 LAB — CBC WITH DIFFERENTIAL (CANCER CENTER ONLY)
ABS IMMATURE GRANULOCYTES: 0.08 10*3/uL — AB (ref 0.00–0.07)
BASOS PCT: 1 %
Basophils Absolute: 0.1 10*3/uL (ref 0.0–0.1)
EOS ABS: 0.2 10*3/uL (ref 0.0–0.5)
Eosinophils Relative: 2 %
HCT: 32.1 % — ABNORMAL LOW (ref 36.0–46.0)
Hemoglobin: 9.9 g/dL — ABNORMAL LOW (ref 12.0–15.0)
Immature Granulocytes: 1 %
Lymphocytes Relative: 8 %
Lymphs Abs: 0.9 10*3/uL (ref 0.7–4.0)
MCH: 24.8 pg — ABNORMAL LOW (ref 26.0–34.0)
MCHC: 30.8 g/dL (ref 30.0–36.0)
MCV: 80.5 fL (ref 80.0–100.0)
MONO ABS: 0.6 10*3/uL (ref 0.1–1.0)
Monocytes Relative: 6 %
NEUTROS ABS: 9 10*3/uL — AB (ref 1.7–7.7)
Neutrophils Relative %: 82 %
PLATELETS: 516 10*3/uL — AB (ref 150–400)
RBC: 3.99 MIL/uL (ref 3.87–5.11)
RDW: 14.3 % (ref 11.5–15.5)
WBC Count: 10.8 10*3/uL — ABNORMAL HIGH (ref 4.0–10.5)
nRBC: 0 % (ref 0.0–0.2)

## 2018-07-24 LAB — CMP (CANCER CENTER ONLY)
ALBUMIN: 1.9 g/dL — AB (ref 3.5–5.0)
ALK PHOS: 137 U/L — AB (ref 38–126)
ALT: 16 U/L (ref 0–44)
AST: 26 U/L (ref 15–41)
Anion gap: 11 (ref 5–15)
BUN: 7 mg/dL (ref 6–20)
CALCIUM: 8.3 mg/dL — AB (ref 8.9–10.3)
CO2: 29 mmol/L (ref 22–32)
Chloride: 98 mmol/L (ref 98–111)
Creatinine: 0.66 mg/dL (ref 0.44–1.00)
GFR, Est AFR Am: >60 mL/min (ref >60)
GFR, Estimated: >60 mL/min (ref >60)
GLUCOSE: 104 mg/dL — AB (ref 70–99)
Potassium: 3.4 mmol/L — ABNORMAL LOW (ref 3.5–5.1)
Sodium: 138 mmol/L (ref 135–145)
TOTAL PROTEIN: 6 g/dL — AB (ref 6.5–8.1)

## 2018-07-24 NOTE — Progress Notes (Signed)
Oncology Nurse Navigator Documentation  Oncology Nurse Navigator Flowsheets 07/24/2018  Navigator Location [facility]  Navigator Encounter Type Clinic/MDC/per Dr. Julien Nordmann, he wanted me to check on pathology results.  I called pathology and was told not resulted out and just necrotic tissue for now.  I updated Dr. Julien Nordmann. I also spoke with patient and sister and updated about PET scan and she will get a call from central scheduling to schedule.   Abnormal Finding Date 07/03/2018  Patient Visit Type MedOnc  Treatment Phase Abnormal Scans  Barriers/Navigation Needs Coordination of Care  Interventions Coordination of Care  Coordination of Care Other  Acuity Level 2  Time Spent with Patient 30

## 2018-07-24 NOTE — Progress Notes (Signed)
[facility] Telephone:([phone]) [phone]   [phone]  CONSULT NOTE  REFERRING PHYSICIAN: Dr. Lanelle Bal  REASON FOR CONSULTATION:  57 years old white female with recurrent pleural effusion and large right lung mass.  HPI Brenda Patel is a 57 y.o. female and never smoker with no significant past medical history except for asthma, depression and cataract surgery as well as history of pneumonia.  The patient mentioned that she had a cataract surgery in September and she was using some eyedrops.  She started having dry cough that was getting worse.  She was seen at 1 of the urgent care center and treated with Z-Pak with no improvement in her condition.  She was seen by her primary care physician and chest x-ray on 07/03/2018 showed large right pleural effusion.  This was followed by ultrasound-guided right thoracentesis with drainage of 1.5 L of serosanguineous fluid.  The cytology was negative for malignancy.  CT scan of the chest on 07/03/2018 showed a right subpulmonic mass measuring 16.4 x 12.2 x 13.0 cm with the mass itself feeling over half of the hemithorax.  There was atelectatic right lower lobe and right middle lobe adjacent to the mass and it was difficult to say for certain if the mass is not arising from the lung, but it could be also the pleural mass.  There was associated small pleural effusion after the thoracentesis.  The mass abuts and causes some mass-effect on the right side of the heart.  On 07/11/2018 the patient underwent another ultrasound-guided right thoracentesis with drainage of 1.5 L of pleural fluid and the cytology was also negative for malignancy.  The patient was referred to Dr. Servando Snare and on July 16, 2018 she underwent drainage of the right pleural effusion with placement of Pleurx catheter.  Again the cytology of the pleural fluid was negative for malignancy.  On July 22, 2018 the patient underwent CT-guided core biopsy of the right  lower lobe lung mass by interventional radiology.  The final pathology is a still pending but preliminary discussion with pathology indicate the presence of necrotic tissue so far and no evidence of malignancy.  The final report is a still pending. The patient was referred to me today for evaluation and recommendation regarding her condition. When seen today she is feeling fine except for the lack of energy as well as shortness of breath increased with exertion.  She denied having any chest pain, cough or hemoptysis.  The patient lost around 8 pounds in the last month.  She denied having any current nausea, vomiting, diarrhea or constipation.  She denied having any headache or visual changes. Family history significant for mother died at age 30 with parkinsonism, father died at age 67 with a stroke. The patient is single and has no children.  She was accompanied today by her Sister Rise Paganini.  The patient is currently retired and used to work as a Pharmacist, hospital.  She has no history for smoking but drinks alcohol occasionally and no history of drug abuse.  HPI  Past Medical History:  Diagnosis Date  . Asthma   . Depression   . Dyspnea   . Pleural effusion on right   . Pleural mass     Past Surgical History:  Procedure Laterality Date  . CHEST TUBE INSERTION Right 07/16/2018   Procedure: INSERTION PLEURAL DRAINAGE CATHETER, right;  Surgeon: Grace Isaac, MD;  Location: [facility];  Service: Thoracic;  Laterality: Right;  . COLONOSCOPY  2017  .  GUM SURGERY  1984   GRAFTS  . IR THORACENTESIS ASP PLEURAL SPACE W/IMG GUIDE  07/03/2018    No family history on file.  Social History Social History   Tobacco Use  . Smoking status: Never Smoker  . Smokeless tobacco: Never Used  Substance Use Topics  . Alcohol use: Yes    Frequency: Never    Comment: 3-4 glasses of wine per month/liquor 1x monthly  . Drug use: Never    Allergies  Allergen Reactions  . Codeine Nausea Only and Other (See  Comments)    Severe constipation, also  . Other Other (See Comments)    Lima beans- Tested allergic    Current Outpatient Medications  Medication Sig Dispense Refill  . acetaminophen (TYLENOL) 500 MG tablet Take 500-1,000 mg by mouth every 6 (six) hours as needed (for pain, discomfort, or fever).    Marland Kitchen albuterol (PROAIR HFA) 108 (90 Base) MCG/ACT inhaler Inhale 2 puffs into the lungs every 6 (six) hours as needed for wheezing or shortness of breath.    . Calcium Citrate 333 MG TABS Take 333 mg by mouth daily.    . [employer] 12 HOUR NASAL DECONGESTANT 120 MG 12 hr tablet Take 120 mg by mouth 2 (two) times daily.  10  . ferrous sulfate 325 (65 FE) MG tablet Take 1 tablet (325 mg total) by mouth 2 (two) times daily with a meal. 120 tablet 0  . fluticasone (FLONASE) 50 MCG/ACT nasal spray Place 1 spray into both nostrils 2 (two) times daily.   5  . loratadine (CLARITIN) 10 MG tablet Take 10 mg by mouth daily.    . meloxicam (MOBIC) 15 MG tablet Take 15 mg by mouth daily as needed for pain.   9  . montelukast (SINGULAIR) 10 MG tablet Take 10 mg by mouth daily after supper.  10  . nefazodone (SERZONE) 100 MG tablet Take 100 mg by mouth 2 (two) times daily.  11  . traMADol (ULTRAM) 50 MG tablet Take 50-100 mg by mouth every 6 (six) hours as needed (for pain). 1-2 TABLET AS NEEDED EVERY 6 HRS AS NEEDED FOR PAIN ORALLY 30 DAYS    . traMADol (ULTRAM) 50 MG tablet Take 1 tablet (50 mg total) by mouth every 6 (six) hours as needed. 20 tablet 0   No current facility-administered medications for this visit.     Review of Systems  Constitutional: positive for fatigue and weight loss Eyes: negative Ears, nose, mouth, throat, and face: negative Respiratory: positive for dyspnea on exertion Cardiovascular: negative Gastrointestinal: negative Genitourinary:negative Integument/breast: negative Hematologic/lymphatic: negative Musculoskeletal:negative Neurological: negative Behavioral/Psych:  negative Endocrine: negative Allergic/Immunologic: negative  Physical Exam  GQB:VQXIH, healthy, no distress, well nourished, well developed and anxious SKIN: skin color, texture, turgor are normal, no rashes or significant lesions HEAD: Normocephalic, No masses, lesions, tenderness or abnormalities EYES: normal, PERRLA, Conjunctiva are pink and non-injected EARS: External ears normal, Canals clear OROPHARYNX:no exudate, no erythema and lips, buccal mucosa, and tongue normal  NECK: supple, no adenopathy, no JVD LYMPH:  no palpable lymphadenopathy, no hepatosplenomegaly BREAST:not examined LUNGS: coarse sounds heard, decreased breath sounds HEART: regular rate & rhythm, no murmurs and no gallops ABDOMEN:abdomen soft, non-tender, normal bowel sounds and no masses or organomegaly BACK: No CVA tenderness, Range of motion is normal EXTREMITIES:no joint deformities, effusion, or inflammation, no edema  NEURO: alert & oriented x 3 with fluent speech, no focal motor/sensory deficits  PERFORMANCE STATUS: ECOG 1  LABORATORY DATA: Lab Results  Component  Value Date   WBC 10.8 (H) 07/24/2018   HGB 9.9 (L) 07/24/2018   HCT 32.1 (L) 07/24/2018   MCV 80.5 07/24/2018   PLT 516 (H) 07/24/2018      Chemistry      Component Value Date/Time   NA 135 07/11/2018 1344   K 3.2 (L) 07/11/2018 1344   CL 98 07/11/2018 1344   CO2 25 07/11/2018 1344   BUN 8 07/11/2018 1344   CREATININE 0.68 07/11/2018 1344      Component Value Date/Time   CALCIUM 8.9 07/11/2018 1344   ALKPHOS 111 07/11/2018 1344   AST 27 07/11/2018 1344   ALT 27 07/11/2018 1344   BILITOT 0.4 07/11/2018 1344       RADIOGRAPHIC STUDIES: Dg Chest 1 View  Result Date: 07/11/2018 CLINICAL DATA:  Post right thoracentesis.  07/11/2018 EXAM: CHEST  1 VIEW COMPARISON:  07/11/2018 FINDINGS: Large right effusion, decreased in size since prior study. No pneumothorax. Right right lower lobe atelectasis or infiltrate. Left lung clear.  IMPRESSION: Large right effusion, decreased following thoracentesis. No pneumothorax. Electronically Signed   By: Rolm Baptise M.D.   On: 07/11/2018 16:15   Dg Chest 1 View  Result Date: 07/03/2018 CLINICAL DATA:  Status post right-sided thoracentesis. EXAM: CHEST  1 VIEW COMPARISON:  Radiograph of same day. FINDINGS: The heart size and mediastinal contours are within normal limits. Left lung is clear. Large right pleural effusion is noted which is slightly decreased compared to prior exam. No pneumothorax is noted. The visualized skeletal structures are unremarkable. IMPRESSION: Right pleural effusion is slightly decreased compared to prior exam. No pneumothorax is noted. Electronically Signed   By: Marijo Conception, M.D.   On: 07/03/2018 15:44   Dg Chest 2 View  Result Date: 07/16/2018 CLINICAL DATA:  Pleural effusion, shortness of Breath EXAM: CHEST - 2 VIEW COMPARISON:  07/11/2018 FINDINGS: Large right pleural effusion, increasing since prior study. Right lower lung atelectasis or infiltrate. Linear scarring at the left base. Otherwise no confluent opacities on the left. Heart is normal size. No effusion on the left. No acute bony abnormality. IMPRESSION: Large right pleural effusion, increasing since prior study with right lower lobe atelectasis or infiltrate. Electronically Signed   By: Rolm Baptise M.D.   On: 07/16/2018 11:17   Dg Chest 2 View  Result Date: 07/11/2018 CLINICAL DATA:  Pleural effusion. EXAM: CHEST - 2 VIEW COMPARISON:  07/03/2018 FINDINGS: Interval progression of right pleural effusion now large in character. No evidence for left pleural effusion. The cardiopericardial silhouette is within normal limits for size. The visualized bony structures of the thorax are intact. Telemetry leads overlie the chest. IMPRESSION: Interval progression of right pleural effusion, now large in size. Electronically Signed   By: Misty Stanley M.D.   On: 07/11/2018 14:33   Dg Chest 2 View  Result  Date: 07/03/2018 CLINICAL DATA:  Short of breath and cough for 2 weeks. History of asthma. EXAM: CHEST - 2 VIEW COMPARISON:  None. FINDINGS: A large pleural effusion occupies at least 60% of the right hemithorax. Cannot exclude underlying pneumonia. Possible irregular pleural thickening versus fluid at the right apex. Left lung is hyperexpanded but clear. No left pleural effusion. No pneumothorax on either side. Cardiac silhouette is normal in size. No mediastinal or left hilar masses. Right hilum is obscured by the contiguous pleural effusion. Skeletal structures are intact. IMPRESSION: 1. Large right pleural effusion. There is a possible underlying pneumonia versus atelectasis. Cannot exclude an underlying mass. Recommend  follow-up chest CT with contrast for further assessment. 2. No evidence of pulmonary edema. Electronically Signed   By: Lajean Manes M.D.   On: 07/03/2018 13:04   Ct Chest W Contrast  Result Date: 07/03/2018 CLINICAL DATA:  Dyspnea. Thoracentesis today. Right pleural effusion. EXAM: CT CHEST WITH CONTRAST TECHNIQUE: Multidetector CT imaging of the chest was performed during intravenous contrast administration. CONTRAST:  2mL OMNIPAQUE IOHEXOL 300 MG/ML  SOLN COMPARISON:  Radiographs from 07/03/2018 FINDINGS: Cardiovascular: Unremarkable Mediastinum/Nodes: Mild deviation of the heart to the left by the large right basilar mass. The mass abuts the pleural margin and also the right anterolateral pericardial margin. No pericardial effusion. A right internal mammary node measures 0.5 cm in short axis on image 71/3. Lungs/Pleura: A right subpulmonic mass measures 16.4 by 12.2 by 13.0 cm (volume = 1360 cm^3), with the mass itself filling over half of the hemithorax. There is atelectatic right lower lobe and right middle lobe adjacent to the mass, and it is difficult to say for certain that the mass is not arising from the lung, although the subpulmonic location would favor a pleural mass. There is  an associated small pleural effusion with some faint enhancement along the pleural effusion margins. The mass abuts and causes some mass effect on the right side of the heart. Upper Abdomen: I do not observe the mass to traverse the diaphragm. There is some mild indistinctness along the medial limb left adrenal gland on image 145/3 which is nonspecific, and not definitively masslike. Musculoskeletal: No rib destruction is identified. No appreciable findings of osseous metastatic disease. IMPRESSION: 1. Very large (volume equals 1360 cubic cm) heterogeneously enhancing right subpulmonic mass. I tend to favor a pleural rather than a pulmonary parenchymal origin although this is not absolutely certain. Differential diagnostic considerations include malignant mesothelioma, lung cancer with pleural spread, other metastatic disease, pleural lymphoma, and solitary fibrous tumor of the pleura. Although I mention fibrous tumor of the pleura, the degree of heterogeneity and lobularity would be atypical for that entity. Thoracic surgical referral recommended. 2. Small to moderate right pleural effusion. Electronically Signed   By: Van Clines M.D.   On: 07/03/2018 18:53   Ct Biopsy  Result Date: 07/22/2018 INDICATION: 57 year old with a large right chest pleural-based mass. Tissue diagnosis is needed. EXAM: CT-GUIDED BIOPSY OF RIGHT CHEST/PLEURAL MASS MEDICATIONS: None. ANESTHESIA/SEDATION: Moderate (conscious) sedation was employed during this procedure. A total of Versed 1.0 mg and Fentanyl 50 mcg was administered intravenously. Moderate Sedation Time: 14 minutes. The patient's level of consciousness and vital signs were monitored continuously by radiology nursing throughout the procedure under my direct supervision. FLUOROSCOPY TIME:  None COMPLICATIONS: None immediate. PROCEDURE: Informed written consent was obtained from the patient after a thorough discussion of the procedural risks, benefits and  alternatives. All questions were addressed. A timeout was performed prior to the initiation of the procedure. Patient was placed prone. CT images through the chest were obtained. The large mass in the right lower chest was targeted. The posterior right chest was prepped with chlorhexidine and sterile field was created. Skin and soft tissues anesthetized with 1% lidocaine. 17 gauge coaxial needle directed into the lesion with CT guidance. Total of 3 core biopsies were obtained with an 18 gauge core device. Specimens placed in formalin. 17 gauge needle was removed without complication. Bandage placed over the puncture site. FINDINGS: Large pleural-based mass in the right lower chest. Needle position confirmed within the lesion. No evidence for pneumothorax following the core biopsy. The  PleurX catheter was identified. There is a small to moderate amount of right pleural fluid. IMPRESSION: CT-guided core biopsy of the right chest mass. Electronically Signed   By: Markus Daft M.D.   On: 07/22/2018 12:24   Dg Chest Port 1 View  Result Date: 07/22/2018 CLINICAL DATA:  Status post right lung biopsy. Right lung mass. EXAM: PORTABLE CHEST 1 VIEW COMPARISON:  Chest x-ray dated 07/16/2018 and chest CT dated 07/03/2018 FINDINGS: Right chest tube in place. Pneumothorax. Large mass in the right mid and lower lung zone as demonstrated on the prior CT scan 07/03/2018. Small to moderate right pleural effusion with some loculated fluid at the right apex. Heart size and vascularity are normal. Left lung is clear. No bone abnormality. IMPRESSION: No pneumothorax after right lung biopsy. Small to moderate right pleural effusion. No pneumothorax after lung biopsy. Slight increased pleural fluid on the right. No other significant change of the extensive opacification of the right hemithorax due to the previously demonstrated lung mass. Electronically Signed   By: Lorriane Shire M.D.   On: 07/22/2018 13:32   Dg Chest Port 1  View  Result Date: 07/16/2018 CLINICAL DATA:  Chest tube in place. EXAM: PORTABLE CHEST 1 VIEW COMPARISON:  07/16/2018 FINDINGS: New right-sided chest tube projects over the right lung apex with interval decrease in moderate to large pleural effusion more so along the periphery and upper right thorax. At least half of the right hemithorax is still opacified by pleural fluid and pulmonary consolidation. Left lung is hyperinflated but clear. No acute osseous abnormality. IMPRESSION: Some interval decrease in right-sided pleural fluid is noted along the right lung apex and periphery of the right upper lobe. At least half of the right hemithorax is still opacified by presumed fluid and pulmonary consolidation along its caudal half. Electronically Signed   By: Ashley Royalty M.D.   On: 07/16/2018 19:08   Dg C-arm 1-60 Min-no Report  Result Date: 07/16/2018 Fluoroscopy was utilized by the requesting physician.  No radiographic interpretation.   Ir Thoracentesis Asp Pleural Space W/img Guide  Result Date: 07/03/2018 INDICATION: Shortness of breath. Large right pleural effusion noted on chest x-ray. Request diagnostic and therapeutic thoracentesis. EXAM: ULTRASOUND GUIDED RIGHT THORACENTESIS MEDICATIONS: None. COMPLICATIONS: None immediate. PROCEDURE: An ultrasound guided thoracentesis was thoroughly discussed with the patient and questions answered. The benefits, risks, alternatives and complications were also discussed. The patient understands and wishes to proceed with the procedure. Written consent was obtained. Ultrasound was performed to localize and mark an adequate pocket of fluid in the right chest. The area was then prepped and draped in the normal sterile fashion. 1% Lidocaine was used for local anesthesia. Under ultrasound guidance a 6 Fr Safe-T-Centesis catheter was introduced. Thoracentesis was performed. The catheter was removed and a dressing applied. FINDINGS: A total of approximately 1.5 L of  hazy, serosanguineous fluid was removed. Samples were sent to the laboratory as requested by the clinical team. IMPRESSION: Successful ultrasound guided right thoracentesis yielding 1.5 L of pleural fluid. Read by: Ascencion Dike PA-C Electronically Signed   By: Jacqulynn Cadet M.D.   On: 07/03/2018 15:56   US Thoracentesis Asp Pleural Space W/img Guide  Result Date: 07/11/2018 INDICATION: 57 year old with a large right chest mass and recurrent right pleural effusion with shortness of breath. EXAM: ULTRASOUND GUIDED RIGHT THORACENTESIS MEDICATIONS: None. COMPLICATIONS: Self-limiting vasovagal episode PROCEDURE: An ultrasound guided thoracentesis was thoroughly discussed with the patient and questions answered. The benefits, risks, alternatives and complications were also discussed.  The patient understands and wishes to proceed with the procedure. Written consent was obtained. Ultrasound was performed to localize and mark an adequate pocket of fluid in the right chest. The area was then prepped and draped in the normal sterile fashion. 1% Lidocaine was used for local anesthesia. Under ultrasound guidance a 6 Fr Safe-T-Centesis catheter was introduced. Thoracentesis was performed. Brief vasovagal episode during drainage of the pleural fluid. Patient said that she was getting dizzy. Systolic blood pressure was 64. As a result, the patient was switched from a upright sitting position to a left lateral decubitus position. Within a few minutes, the patient's symptoms resolved and blood pressure returned to baseline. After the blood pressure returned to baseline, the pleural fluid drainage was continued to completion. The catheter was removed and a dressing applied. FINDINGS: A total of approximately 1.5 L of amber colored fluid was removed. Samples were sent to the laboratory as requested by the clinical team. IMPRESSION: Successful ultrasound guided right thoracentesis yielding 1.5 L of pleural fluid.  Electronically Signed   By: Markus Daft M.D.   On: 07/11/2018 16:55    ASSESSMENT: This is a very pleasant 57 years old white female with very large right lower lobe lung mass measuring up to 16 cm in size with recurrent pleural effusion concerning for malignancy, pending tissue diagnosis.   PLAN: I had a lengthy discussion with the patient and her sister today about her current condition and further investigation to confirm diagnosis. The cytology from the drain the pleural effusion is negative for malignancy so far. The preliminary pathology from the CT-guided core biopsy of the large lung mass showed necrotic tissue and no malignancy identified but the final report is still pending. I recommended for the patient to have a PET scan performed for further evaluation of this large mass with recurrent pleural effusion. We may consider repeat CT-guided core biopsy as the PET scan showed hypermetabolic activity in the mass. For the recurrent right pleural effusion, the patient will continue the drainage via the Pleurx catheter for now. I will arrange for the patient to come back for follow-up visit few days after her PET scan for further evaluation and discussion of her treatment options. She was advised to call immediately if she has any concerning symptoms in the interval. The patient voices understanding of current disease status and treatment options and is in agreement with the current care plan.  All questions were answered. The patient knows to call the clinic with any problems, questions or concerns. We can certainly see the patient much sooner if necessary.  Thank you so much for allowing me to participate in the care of Brenda Patel. I will continue to follow up the patient with you and assist in her care.  I spent 40 minutes counseling the patient face to face. The total time spent in the appointment was 60 minutes.  Disclaimer: This note was dictated with voice recognition software.  Similar sounding words can inadvertently be transcribed and may not be corrected upon review.   Eilleen Kempf July 24, 2018, 10:14 AM

## 2018-07-27 ENCOUNTER — Ambulatory Visit: Payer: BC Managed Care – PPO | Admitting: Internal Medicine

## 2018-07-31 ENCOUNTER — Ambulatory Visit: Payer: BC Managed Care – PPO

## 2018-07-31 ENCOUNTER — Other Ambulatory Visit: Payer: Self-pay | Admitting: *Deleted

## 2018-07-31 DIAGNOSIS — J90 Pleural effusion, not elsewhere classified: Secondary | ICD-10-CM

## 2018-07-31 DIAGNOSIS — Z719 Counseling, unspecified: Secondary | ICD-10-CM

## 2018-07-31 NOTE — Progress Notes (Signed)
Drained 900 cc's today from right pleurX. Patient is currently draining every Sun/Tues/Thurs from home health and will continue to drain here at Dr Everrett [facility]om[facility] office on Fridays. She is averaging 600 cc-1000 cc per draining day.

## 2018-08-03 ENCOUNTER — Encounter (HOSPITAL_COMMUNITY)
Admission: RE | Admit: 2018-08-03 | Discharge: 2018-08-03 | Disposition: A | Discharge status: Home / Self Care | Payer: BC Managed Care – PPO | Source: Ambulatory Visit | Attending: Internal Medicine | Admitting: Internal Medicine

## 2018-08-03 ENCOUNTER — Telehealth: Payer: Self-pay | Admitting: *Deleted

## 2018-08-03 ENCOUNTER — Telehealth: Payer: Self-pay | Admitting: Internal Medicine

## 2018-08-03 DIAGNOSIS — R911 Solitary pulmonary nodule: Secondary | ICD-10-CM | POA: Diagnosis not present

## 2018-08-03 LAB — GLUCOSE, CAPILLARY: Glucose-Capillary: 99 mg/dL (ref 70–99)

## 2018-08-03 MED ORDER — FLUDEOXYGLUCOSE F - 18 (FDG) INJECTION
6.8200 | Freq: Once | INTRAVENOUS | Status: AC | PRN
  Administered 2018-08-03: 6.82 via INTRAVENOUS

## 2018-08-03 NOTE — Telephone Encounter (Signed)
Pt states she had her PET today, but does not have an appt with Dr Julien Nordmann. Pt is very anxious as it has been several weeks since mass was discovered  Message to scheduler to arrange follow up ASAP with Dr Julien Nordmann

## 2018-08-03 NOTE — Telephone Encounter (Signed)
Scheduled appt per 12/9 sch message - pt is aware of apt date and time

## 2018-08-04 ENCOUNTER — Telehealth: Payer: Self-pay

## 2018-08-04 NOTE — Telephone Encounter (Signed)
Selinda Eon, nurse with [facility] [phone] contacted the office requesting verbal orders to continue drainage of Ms. Younts PleurX drain.  Verbal orders given for 3 times a week for 6 weeks to drain.  Will await faxed copy for physician signature.

## 2018-08-07 ENCOUNTER — Ambulatory Visit: Payer: BC Managed Care – PPO

## 2018-08-07 ENCOUNTER — Telehealth: Payer: Self-pay | Admitting: Internal Medicine

## 2018-08-07 ENCOUNTER — Inpatient Hospital Stay: Payer: BC Managed Care – PPO | Attending: Internal Medicine | Admitting: Internal Medicine

## 2018-08-07 VITALS — BP 132/76 | HR 120 | Temp 98.5°F | Resp 20 | Ht 66.0 in | Wt 140.1 lb

## 2018-08-07 DIAGNOSIS — J90 Pleural effusion, not elsewhere classified: Secondary | ICD-10-CM | POA: Diagnosis not present

## 2018-08-07 DIAGNOSIS — R918 Other nonspecific abnormal finding of lung field: Secondary | ICD-10-CM

## 2018-08-07 DIAGNOSIS — Z719 Counseling, unspecified: Secondary | ICD-10-CM | POA: Diagnosis not present

## 2018-08-07 DIAGNOSIS — R1310 Dysphagia, unspecified: Secondary | ICD-10-CM | POA: Insufficient documentation

## 2018-08-07 DIAGNOSIS — C3431 Malignant neoplasm of lower lobe, right bronchus or lung: Secondary | ICD-10-CM | POA: Diagnosis present

## 2018-08-07 DIAGNOSIS — Z79899 Other long term (current) drug therapy: Secondary | ICD-10-CM | POA: Diagnosis not present

## 2018-08-07 NOTE — Telephone Encounter (Signed)
Referral to IR for CT-guided core biopsy.  Printed AVS.

## 2018-08-07 NOTE — Progress Notes (Signed)
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  PRIOR THERAPY: Status post right Pleurx catheter placement for drainage of recurrent right pleural effusion.  CURRENT THERAPY: None.  INTERVAL HISTORY: Brenda Patel 57 y.o. female returns to the clinic today for follow-up visit accompanied by friend.  The patient is feeling fine today with no concerning complaints except for fatigue as well as some recent dysphagia.  She denied having any chest pain, shortness breath, cough or hemoptysis.  She denied having any fever or chills.  She has no nausea, vomiting, diarrhea or constipation.  She has no headache or visual changes.  She had biopsy of the left lower lobe lung mass performed recently.  The final pathology ([id number]) showed atypical spindle cell proliferation with abundant necrosis, consistent with a malignant process.  The malignant cells are focally positive for cytokeratin AE1/AE3, cytokeratin 8/18, BCL-2 and CD99.  They are negative for calretinin and DT-40.  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  However the focal staining with BCL-2 and CD99 raises the possibility of synovial sarcoma, although considered less likely.  The patient also had a PET scan performed recently.  She is here today for evaluation and recommendation regarding her condition and treatment options.  MEDICAL HISTORY: Past Medical History:  Diagnosis Date  . Asthma   . Depression   . Dyspnea   . Pleural effusion on right   . Pleural mass     ALLERGIES:  is allergic to codeine and other.  MEDICATIONS:    Current Outpatient Medications  Medication Sig Dispense Refill  . acetaminophen (TYLENOL) 500 MG tablet Take 500-1,000 mg by mouth every 6 (six) hours as needed (for pain, discomfort, or fever).    Marland Kitchen albuterol (PROAIR HFA) 108 (90 Base) MCG/ACT inhaler Inhale 2 puffs into the lungs every 6 (six) hours as needed for wheezing or shortness of breath.    . Calcium Citrate 333 MG TABS Take 333 mg by mouth daily.    . [facility] 12 HOUR NASAL DECONGESTANT 120 MG 12 hr tablet Take 120 mg by mouth 2 (two) times daily.  10  . fluticasone (FLONASE) 50 MCG/ACT nasal spray Place 1 spray into both nostrils 2 (two) times daily.   5  . loratadine (CLARITIN) 10 MG tablet Take 10 mg by mouth daily.    . meloxicam (MOBIC) 15 MG tablet Take 15 mg by mouth daily as needed for pain.   9  . montelukast (SINGULAIR) 10 MG tablet Take 10 mg by mouth daily after supper.  10  . nefazodone (SERZONE) 100 MG tablet Take 100 mg by mouth 2 (two) times daily.  11  . traMADol (ULTRAM) 50 MG tablet Take 1 tablet (50 mg total) by mouth every 6 (six) hours as needed. (Patient not taking: Reported on 07/31/2018) 20 tablet 0   No current facility-administered medications for this visit.     SURGICAL HISTORY:  Past Surgical History:  Procedure Laterality Date  . CHEST TUBE INSERTION Right 07/16/2018   Procedure: INSERTION PLEURAL DRAINAGE CATHETER, right;  Surgeon: Grace Isaac, MD;  Location: [facility];  Service: Thoracic;  Laterality: Right;  .  COLONOSCOPY  2017  . GUM SURGERY  1984   GRAFTS  . IR THORACENTESIS ASP PLEURAL SPACE W/IMG GUIDE  07/03/2018    REVIEW OF SYSTEMS:  Constitutional: positive for fatigue Eyes: negative Ears, nose, mouth, throat, and face: negative Respiratory: positive for dyspnea on exertion Cardiovascular: negative Gastrointestinal: positive for dysphagia Genitourinary:negative Integument/breast: negative Hematologic/lymphatic: negative Musculoskeletal:negative Neurological:  negative Behavioral/Psych: negative Endocrine: negative Allergic/Immunologic: negative   PHYSICAL EXAMINATION: General appearance: alert, cooperative, fatigued and no distress Head: Normocephalic, without obvious abnormality, atraumatic Neck: no adenopathy, no JVD, supple, symmetrical, trachea midline and thyroid not enlarged, symmetric, no tenderness/mass/nodules Lymph nodes: Cervical, supraclavicular, and axillary nodes normal. Resp: diminished breath sounds RLL and dullness to percussion RLL Back: symmetric, no curvature. ROM normal. No CVA tenderness. Cardio: regular rate and rhythm, S1, S2 normal, no murmur, click, rub or gallop GI: soft, non-tender; bowel sounds normal; no masses,  no organomegaly Extremities: extremities normal, atraumatic, no cyanosis or edema Neurologic: Alert and oriented X 3, normal strength and tone. Normal symmetric reflexes. Normal coordination and gait  ECOG PERFORMANCE STATUS: 1 - Symptomatic but completely ambulatory  Blood pressure 132/76, pulse (!) 120, temperature 98.5 F (36.9 C), temperature source Oral, resp. rate 20, height 5' 6"  (1.676 m), weight 140 lb 1.6 oz (63.5 kg), SpO2 98 %.  LABORATORY DATA: Lab Results  Component Value Date   WBC 10.8 (H) 07/24/2018   HGB 9.9 (L) 07/24/2018   HCT 32.1 (L) 07/24/2018   MCV 80.5 07/24/2018   PLT 516 (H) 07/24/2018      Chemistry      Component Value Date/Time   NA 138 07/24/2018 0935   K 3.4 (L) 07/24/2018 0935   CL 98 07/24/2018 0935   CO2 29 07/24/2018 0935   BUN 7 07/24/2018 0935   CREATININE 0.66 07/24/2018 0935      Component Value Date/Time   CALCIUM 8.3 (L) 07/24/2018 0935   ALKPHOS 137 (H) 07/24/2018 0935   AST 26 07/24/2018 0935   ALT 16 07/24/2018 0935   BILITOT <0.2 (L) 07/24/2018 0935       RADIOGRAPHIC STUDIES: Dg Chest 1 View  Result Date: 07/11/2018 CLINICAL DATA:  Post right thoracentesis.  07/11/2018 EXAM: CHEST  1 VIEW COMPARISON:  07/11/2018 FINDINGS: Large  right effusion, decreased in size since prior study. No pneumothorax. Right right lower lobe atelectasis or infiltrate. Left lung clear. IMPRESSION: Large right effusion, decreased following thoracentesis. No pneumothorax. Electronically Signed   By: Rolm Baptise M.D.   On: 07/11/2018 16:15   Dg Chest 2 View  Result Date: 07/16/2018 CLINICAL DATA:  Pleural effusion, shortness of Breath EXAM: CHEST - 2 VIEW COMPARISON:  07/11/2018 FINDINGS: Large right pleural effusion, increasing since prior study. Right lower lung atelectasis or infiltrate. Linear scarring at the left base. Otherwise no confluent opacities on the left. Heart is normal size. No effusion on the left. No acute bony abnormality. IMPRESSION: Large right pleural effusion, increasing since prior study with right lower lobe atelectasis or infiltrate. Electronically Signed   By: Rolm Baptise M.D.   On: 07/16/2018 11:17   Dg Chest 2 View  Result Date: 07/11/2018 CLINICAL DATA:  Pleural effusion. EXAM: CHEST - 2 VIEW COMPARISON:  07/03/2018 FINDINGS: Interval progression of right pleural effusion now large in character. No evidence for left pleural effusion. The cardiopericardial silhouette is within normal limits for size. The visualized bony structures of the thorax are intact. Telemetry leads overlie the chest. IMPRESSION: Interval progression of right pleural effusion, now  large in size. Electronically Signed   By: Misty Stanley M.D.   On: 07/11/2018 14:33   Nm Pet Image Initial (pi) Skull Base To Thigh  Result Date: 08/03/2018 CLINICAL DATA:  Initial treatment strategy for lung cancer. EXAM: NUCLEAR MEDICINE PET SKULL BASE TO THIGH TECHNIQUE: 6.8 mCi F-18 FDG was injected intravenously. Full-ring PET imaging was performed from the skull base to thigh after the radiotracer. CT data was obtained and used for attenuation correction and anatomic localization. Fasting blood glucose: 99 mg/dl COMPARISON:  CT chest 07/03/18 FINDINGS: Mediastinal  blood pool activity: SUV max 1.8 NECK: No hypermetabolic lymph nodes in the neck. Incidental CT findings: none CHEST: No hypermetabolic axillary or supraclavicular lymph nodes. No hypermetabolic mediastinal or left hilar adenopathy identified. Large mass occupies the lower half of the right hemithorax. This has a maximum dimension of 14.6 cm. Heterogeneous increased uptake within the solid components of this mass are identified with an SUV max of 8.28. There is a moderate loculated right pleural effusion. Chest tube is in place.No satellite nodules identified within the adjacent lung parenchyma. No pulmonary nodule or mass identified within the left hemithorax. Incidental CT findings: none ABDOMEN/PELVIS: No abnormal hypermetabolic activity within the liver, pancreas, adrenal glands, or spleen. No hypermetabolic lymph nodes in the abdomen or pelvis. Incidental CT findings: Nonobstructing left renal calculus measures 2-3 mm. SKELETON: No focal hypermetabolic activity to suggest skeletal metastasis. Incidental CT findings: none IMPRESSION: 1. Large heterogeneous mass occupying approximately 50% of the right hemithorax is again identified. This demonstrates areas of moderate increased uptake with SUV max as high as 8.28 compatible with malignancy. Associated loculated right pleural effusion is identified with chest tube in place. 2. No hypermetabolic adenopathy or evidence of distant metastatic disease. Electronically Signed   By: Kerby Moors M.D.   On: 08/03/2018 11:21   Ct Biopsy  Result Date: 07/22/2018 INDICATION: 57 year old with a large right chest pleural-based mass. Tissue diagnosis is needed. EXAM: CT-GUIDED BIOPSY OF RIGHT CHEST/PLEURAL MASS MEDICATIONS: None. ANESTHESIA/SEDATION: Moderate (conscious) sedation was employed during this procedure. A total of Versed 1.0 mg and Fentanyl 50 mcg was administered intravenously. Moderate Sedation Time: 14 minutes. The patient's level of consciousness and  vital signs were monitored continuously by radiology nursing throughout the procedure under my direct supervision. FLUOROSCOPY TIME:  None COMPLICATIONS: None immediate. PROCEDURE: Informed written consent was obtained from the patient after a thorough discussion of the procedural risks, benefits and alternatives. All questions were addressed. A timeout was performed prior to the initiation of the procedure. Patient was placed prone. CT images through the chest were obtained. The large mass in the right lower chest was targeted. The posterior right chest was prepped with chlorhexidine and sterile field was created. Skin and soft tissues anesthetized with 1% lidocaine. 17 gauge coaxial needle directed into the lesion with CT guidance. Total of 3 core biopsies were obtained with an 18 gauge core device. Specimens placed in formalin. 17 gauge needle was removed without complication. Bandage placed over the puncture site. FINDINGS: Large pleural-based mass in the right lower chest. Needle position confirmed within the lesion. No evidence for pneumothorax following the core biopsy. The PleurX catheter was identified. There is a Patel to moderate amount of right pleural fluid. IMPRESSION: CT-guided core biopsy of the right chest mass. Electronically Signed   By: Markus Daft M.D.   On: 07/22/2018 12:24   Dg Chest Port 1 View  Result Date: 07/22/2018 CLINICAL DATA:  Status post right lung biopsy. Right  lung mass. EXAM: PORTABLE CHEST 1 VIEW COMPARISON:  Chest x-ray dated 07/16/2018 and chest CT dated 07/03/2018 FINDINGS: Right chest tube in place. Pneumothorax. Large mass in the right mid and lower lung zone as demonstrated on the prior CT scan 07/03/2018. Patel to moderate right pleural effusion with some loculated fluid at the right apex. Heart size and vascularity are normal. Left lung is clear. No bone abnormality. IMPRESSION: No pneumothorax after right lung biopsy. Patel to moderate right pleural effusion. No  pneumothorax after lung biopsy. Slight increased pleural fluid on the right. No other significant change of the extensive opacification of the right hemithorax due to the previously demonstrated lung mass. Electronically Signed   By: Lorriane Shire M.D.   On: 07/22/2018 13:32   Dg Chest Port 1 View  Result Date: 07/16/2018 CLINICAL DATA:  Chest tube in place. EXAM: PORTABLE CHEST 1 VIEW COMPARISON:  07/16/2018 FINDINGS: New right-sided chest tube projects over the right lung apex with interval decrease in moderate to large pleural effusion more so along the periphery and upper right thorax. At least half of the right hemithorax is still opacified by pleural fluid and pulmonary consolidation. Left lung is hyperinflated but clear. No acute osseous abnormality. IMPRESSION: Some interval decrease in right-sided pleural fluid is noted along the right lung apex and periphery of the right upper lobe. At least half of the right hemithorax is still opacified by presumed fluid and pulmonary consolidation along its caudal half. Electronically Signed   By: Ashley Royalty M.D.   On: 07/16/2018 19:08   Dg C-arm 1-60 Min-no Report  Result Date: 07/16/2018 Fluoroscopy was utilized by the requesting physician.  No radiographic interpretation.   US Thoracentesis Asp Pleural Space W/img Guide  Result Date: 07/11/2018 INDICATION: 57 year old with a large right chest mass and recurrent right pleural effusion with shortness of breath. EXAM: ULTRASOUND GUIDED RIGHT THORACENTESIS MEDICATIONS: None. COMPLICATIONS: Self-limiting vasovagal episode PROCEDURE: An ultrasound guided thoracentesis was thoroughly discussed with the patient and questions answered. The benefits, risks, alternatives and complications were also discussed. The patient understands and wishes to proceed with the procedure. Written consent was obtained. Ultrasound was performed to localize and mark an adequate pocket of fluid in the right chest. The area was  then prepped and draped in the normal sterile fashion. 1% Lidocaine was used for local anesthesia. Under ultrasound guidance a 6 Fr Safe-T-Centesis catheter was introduced. Thoracentesis was performed. Brief vasovagal episode during drainage of the pleural fluid. Patient said that she was getting dizzy. Systolic blood pressure was 64. As a result, the patient was switched from a upright sitting position to a left lateral decubitus position. Within a few minutes, the patient's symptoms resolved and blood pressure returned to baseline. After the blood pressure returned to baseline, the pleural fluid drainage was continued to completion. The catheter was removed and a dressing applied. FINDINGS: A total of approximately 1.5 L of amber colored fluid was removed. Samples were sent to the laboratory as requested by the clinical team. IMPRESSION: Successful ultrasound guided right thoracentesis yielding 1.5 L of pleural fluid. Electronically Signed   By: Markus Daft M.D.   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  The patient had a recent PET scan.  I personally and independently reviewed the PET scan images and discussed the result and showed the images to the patient today. I recommended for the patient to have repeat CT-guided core biopsy of 1 of the hypermetabolic areas of the right lower lobe lung mass to have enough tissue material for a better diagnosis and confirmation of her tissue diagnosis. The patient started having some kind of dysphasia that could be secondary to compression from the large lung mass in her esophagus.  I may consider referring the patient to radiation oncology if her symptoms  increase. For the recurrent right pleural effusion, she will continue the drainage with the Pleurx catheter. I will arrange for the patient a follow-up appointment for more detailed discussion of her treatment options once the final pathology becomes available. The patient was advised to call immediately if she has any concerning symptoms in the interval. The patient voices understanding of current disease status and treatment options and is in agreement with the current care plan.  All questions were answered. The patient knows to call the clinic with any problems, questions or concerns. We can certainly see the patient much sooner if necessary.  I spent 15 minutes counseling the patient face to face. The total time spent in the appointment was 25 minutes.  Disclaimer: This note was dictated with voice recognition software. Similar sounding words can inadvertently be transcribed and may not be corrected upon review.

## 2018-08-07 NOTE — Progress Notes (Signed)
Drained 700 cc's from right PleurX today. Brenda Patel is C/O pain with swallowing in chest area. She is scheduled to see Dr Julien Nordmann today at 1130 am to review PET Scan results.

## 2018-08-08 ENCOUNTER — Encounter: Payer: Self-pay | Admitting: Internal Medicine

## 2018-08-10 ENCOUNTER — Other Ambulatory Visit: Payer: Self-pay | Admitting: Student

## 2018-08-11 ENCOUNTER — Encounter (HOSPITAL_COMMUNITY): Payer: Self-pay

## 2018-08-11 ENCOUNTER — Ambulatory Visit (HOSPITAL_COMMUNITY)
Admission: RE | Admit: 2018-08-11 | Discharge: 2018-08-11 | Disposition: A | Discharge status: Home / Self Care | Payer: BC Managed Care – PPO | Source: Ambulatory Visit | Attending: Diagnostic Radiology | Admitting: Diagnostic Radiology

## 2018-08-11 ENCOUNTER — Ambulatory Visit (HOSPITAL_COMMUNITY): Admission: RE | Admit: 2018-08-11 | Payer: BC Managed Care – PPO | Source: Ambulatory Visit

## 2018-08-11 ENCOUNTER — Ambulatory Visit (HOSPITAL_COMMUNITY)
Admission: RE | Admit: 2018-08-11 | Discharge: 2018-08-11 | Disposition: A | Discharge status: Home / Self Care | Payer: BC Managed Care – PPO | Source: Ambulatory Visit | Attending: Internal Medicine | Admitting: Internal Medicine

## 2018-08-11 DIAGNOSIS — J90 Pleural effusion, not elsewhere classified: Secondary | ICD-10-CM | POA: Diagnosis not present

## 2018-08-11 DIAGNOSIS — Z885 Allergy status to narcotic agent status: Secondary | ICD-10-CM | POA: Diagnosis not present

## 2018-08-11 DIAGNOSIS — Z79899 Other long term (current) drug therapy: Secondary | ICD-10-CM | POA: Insufficient documentation

## 2018-08-11 DIAGNOSIS — Z9889 Other specified postprocedural states: Secondary | ICD-10-CM | POA: Diagnosis not present

## 2018-08-11 DIAGNOSIS — R918 Other nonspecific abnormal finding of lung field: Secondary | ICD-10-CM | POA: Insufficient documentation

## 2018-08-11 LAB — CBC
HCT: 35 % — ABNORMAL LOW (ref 36.0–46.0)
HEMOGLOBIN: 10.3 g/dL — AB (ref 12.0–15.0)
MCH: 24.1 pg — ABNORMAL LOW (ref 26.0–34.0)
MCHC: 29.4 g/dL — AB (ref 30.0–36.0)
MCV: 81.8 fL (ref 80.0–100.0)
NRBC: 0 % (ref 0.0–0.2)
Platelets: 573 10*3/uL — ABNORMAL HIGH (ref 150–400)
RBC: 4.28 MIL/uL (ref 3.87–5.11)
RDW: 15.5 % (ref 11.5–15.5)
WBC: 10.2 10*3/uL (ref 4.0–10.5)

## 2018-08-11 LAB — APTT: aPTT: 31 seconds (ref 24–36)

## 2018-08-11 LAB — PROTIME-INR
INR: 1
Prothrombin Time: 13.1 seconds (ref 11.4–15.2)

## 2018-08-11 MED ORDER — FENTANYL CITRATE (PF) 100 MCG/2ML IJ SOLN
INTRAMUSCULAR | Status: AC
  Filled 2018-08-11: qty 2

## 2018-08-11 MED ORDER — SODIUM CHLORIDE 0.9 % IV SOLN
INTRAVENOUS | Status: AC | PRN
  Administered 2018-08-11: 10 mL/h via INTRAVENOUS

## 2018-08-11 MED ORDER — FENTANYL CITRATE (PF) 100 MCG/2ML IJ SOLN
INTRAMUSCULAR | Status: AC | PRN
  Administered 2018-08-11 (×2): 50 ug via INTRAVENOUS

## 2018-08-11 MED ORDER — MIDAZOLAM HCL 2 MG/2ML IJ SOLN
INTRAMUSCULAR | Status: AC | PRN
  Administered 2018-08-11 (×2): 1 mg via INTRAVENOUS

## 2018-08-11 MED ORDER — MIDAZOLAM HCL 2 MG/2ML IJ SOLN
INTRAMUSCULAR | Status: AC
  Filled 2018-08-11: qty 2

## 2018-08-11 MED ORDER — LIDOCAINE HCL 1 % IJ SOLN
INTRAMUSCULAR | Status: AC
  Filled 2018-08-11: qty 20

## 2018-08-11 MED ORDER — SODIUM CHLORIDE 0.9 % IV SOLN: INTRAVENOUS | Status: DC

## 2018-08-11 NOTE — H&P (Signed)
Chief Complaint: Patient was seen in consultation today for rebiopsy of right lung mass  at the request of [facility]  Referring Physician(s): Mohamed,Mohamed  Supervising Physician: Markus Daft  Patient Status: [facility] - Out-pt  History of Present Illness: Brenda Patel is a 57 y.o. 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  PET was performed 08/03/18: IMPRESSION: 1. Large heterogeneous mass occupying approximately 50% of the right hemithorax is again identified. This demonstrates areas of moderate increased uptake with SUV max as high as 8.28 compatible with malignancy. Associated loculated right pleural effusion is identified with chest tube in place. 2. No hypermetabolic adenopathy or evidence of distant metastatic disease.  Dr Anselm Pancoast has approved re biopsy of right lung mass with PET guidance   Past Medical History:  Diagnosis Date  . Asthma   . Depression   . Dyspnea   . Pleural effusion on right   . Pleural mass     Past Surgical History:  Procedure Laterality Date  . CHEST TUBE INSERTION Right 07/16/2018   Procedure: INSERTION PLEURAL DRAINAGE CATHETER, right;  Surgeon: Grace Isaac, MD;  Location: [place];  Service: Thoracic;  Laterality: Right;  . COLONOSCOPY  2017  . GUM SURGERY  1984   GRAFTS  . IR THORACENTESIS ASP PLEURAL SPACE W/IMG GUIDE  07/03/2018    Allergies: Codeine and Other  Medications: Prior to Admission medications   Medication Sig Start Date End Date Taking? Authorizing Provider  Calcium Citrate 333 MG TABS Take 333 mg by mouth daily.   Yes [provider]  [facility] 12 HOUR NASAL DECONGESTANT 120 MG 12 hr tablet Take 120 mg by mouth 2 (two) times daily. 05/31/18  Yes [provider]  fluticasone (FLONASE) 50 MCG/ACT nasal spray Place 1 spray into both nostrils 2  (two) times daily.  05/05/18  Yes [provider]  loratadine (CLARITIN) 10 MG tablet Take 10 mg by mouth daily.   Yes [provider]  montelukast (SINGULAIR) 10 MG tablet Take 10 mg by mouth daily after supper. 06/07/18  Yes [provider]  nefazodone (SERZONE) 100 MG tablet Take 100 mg by mouth 2 (two) times daily. 06/07/18  Yes [provider]  traMADol (ULTRAM) 50 MG tablet Take 1 tablet (50 mg total) by mouth every 6 (six) hours as needed. 07/16/18 07/16/19 Yes Grace Isaac, MD  albuterol (PROAIR HFA) 108 [phone] Base) MCG/ACT inhaler Inhale 2 puffs into the lungs every 6 (six) hours as needed for wheezing or shortness of breath.     [provider]     History reviewed. No pertinent family history.  Social History   Socioeconomic History  . Marital status: Single    Spouse name: Not on file  . Number of children: Not on file  . Years of education: Not on file  . Highest education level: Not on file  Occupational History  . Not on file  Social Needs  . Financial resource strain: Not on file  . Food insecurity:    Worry: Not on file    Inability: Not on file  . Transportation needs:    Medical: Not on file    Non-medical: Not on file  Tobacco Use  . Smoking status: Never Smoker  . Smokeless tobacco: Never Used  Substance and Sexual Activity  . Alcohol use: Yes  Frequency: Never    Comment: 3-4 glasses of wine per month/liquor 1x monthly  . Drug use: Never  . Sexual activity: Not on file  Lifestyle  . Physical activity:    Days per week: 5 days    Minutes per session: Not on file  . Stress: Not on file  Relationships  . Social connections:    Talks on phone: Not on file    Gets together: Not on file    Attends religious service: Not on file    Active member of club or organization: Not on file    Attends meetings of clubs or organizations: Not on file    Relationship status: Not on file  Other Topics Concern  .  Not on file  Social History Narrative  . Not on file    Review of Systems: A 12 point ROS discussed and pertinent positives are indicated in the HPI above.  All other systems are negative.  Review of Systems  Constitutional: Negative for activity change and fever.  Respiratory: Negative for shortness of breath.   Neurological: Negative for weakness.  Psychiatric/Behavioral: Negative for behavioral problems and confusion.    Vital Signs: BP 116/68   Pulse (!) 108   Temp 99 F (37.2 C) (Oral)   Resp 16   Ht 5\' 6"  (1.676 m)   Wt 138 lb (62.6 kg)   SpO2 95%   BMI 22.27 kg/m   Physical Exam Vitals signs reviewed.  Cardiovascular:     Rate and Rhythm: Normal rate and regular rhythm.  Pulmonary:     Effort: Pulmonary effort is normal.     Breath sounds: Normal breath sounds.  Abdominal:     General: Bowel sounds are normal.     Palpations: Abdomen is soft.  Musculoskeletal: Normal range of motion.  Skin:    General: Skin is warm and dry.  Neurological:     Mental Status: She is oriented to person, place, and time.  Psychiatric:        Mood and Affect: Mood normal.        Behavior: Behavior normal.     Imaging: Dg Chest 2 View  Result Date: 07/16/2018 CLINICAL DATA:  Pleural effusion, shortness of Breath EXAM: CHEST - 2 VIEW COMPARISON:  07/11/2018 FINDINGS: Large right pleural effusion, increasing since prior study. Right lower lung atelectasis or infiltrate. Linear scarring at the left base. Otherwise no confluent opacities on the left. Heart is normal size. No effusion on the left. No acute bony abnormality. IMPRESSION: Large right pleural effusion, increasing since prior study with right lower lobe atelectasis or infiltrate. Electronically Signed   By: Rolm Baptise M.D.   On: 07/16/2018 11:17   Nm Pet Image Initial (pi) Skull Base To Thigh  Result Date: 08/03/2018 CLINICAL DATA:  Initial treatment strategy for lung cancer. EXAM: NUCLEAR MEDICINE PET SKULL BASE TO  THIGH TECHNIQUE: 6.8 mCi F-18 FDG was injected intravenously. Full-ring PET imaging was performed from the skull base to thigh after the radiotracer. CT data was obtained and used for attenuation correction and anatomic localization. Fasting blood glucose: 99 mg/dl COMPARISON:  CT chest 07/03/18 FINDINGS: Mediastinal blood pool activity: SUV max 1.8 NECK: No hypermetabolic lymph nodes in the neck. Incidental CT findings: none CHEST: No hypermetabolic axillary or supraclavicular lymph nodes. No hypermetabolic mediastinal or left hilar adenopathy identified. Large mass occupies the lower half of the right hemithorax. This has a maximum dimension of 14.6 cm. Heterogeneous increased uptake within the solid components of  this mass are identified with an SUV max of 8.28. There is a moderate loculated right pleural effusion. Chest tube is in place.No satellite nodules identified within the adjacent lung parenchyma. No pulmonary nodule or mass identified within the left hemithorax. Incidental CT findings: none ABDOMEN/PELVIS: No abnormal hypermetabolic activity within the liver, pancreas, adrenal glands, or spleen. No hypermetabolic lymph nodes in the abdomen or pelvis. Incidental CT findings: Nonobstructing left renal calculus measures 2-3 mm. SKELETON: No focal hypermetabolic activity to suggest skeletal metastasis. Incidental CT findings: none IMPRESSION: 1. Large heterogeneous mass occupying approximately 50% of the right hemithorax is again identified. This demonstrates areas of moderate increased uptake with SUV max as high as 8.28 compatible with malignancy. Associated loculated right pleural effusion is identified with chest tube in place. 2. No hypermetabolic adenopathy or evidence of distant metastatic disease. Electronically Signed   By: Kerby Moors M.D.   On: 08/03/2018 11:21   Ct Biopsy  Result Date: 07/22/2018 INDICATION: 57 year old with a large right chest pleural-based mass. Tissue diagnosis is  needed. EXAM: CT-GUIDED BIOPSY OF RIGHT CHEST/PLEURAL MASS MEDICATIONS: None. ANESTHESIA/SEDATION: Moderate (conscious) sedation was employed during this procedure. A total of Versed 1.0 mg and Fentanyl 50 mcg was administered intravenously. Moderate Sedation Time: 14 minutes. The patient's level of consciousness and vital signs were monitored continuously by radiology nursing throughout the procedure under my direct supervision. FLUOROSCOPY TIME:  None COMPLICATIONS: None immediate. PROCEDURE: Informed written consent was obtained from the patient after a thorough discussion of the procedural risks, benefits and alternatives. All questions were addressed. A timeout was performed prior to the initiation of the procedure. Patient was placed prone. CT images through the chest were obtained. The large mass in the right lower chest was targeted. The posterior right chest was prepped with chlorhexidine and sterile field was created. Skin and soft tissues anesthetized with 1% lidocaine. 17 gauge coaxial needle directed into the lesion with CT guidance. Total of 3 core biopsies were obtained with an 18 gauge core device. Specimens placed in formalin. 17 gauge needle was removed without complication. Bandage placed over the puncture site. FINDINGS: Large pleural-based mass in the right lower chest. Needle position confirmed within the lesion. No evidence for pneumothorax following the core biopsy. The PleurX catheter was identified. There is a small to moderate amount of right pleural fluid. IMPRESSION: CT-guided core biopsy of the right chest mass. Electronically Signed   By: Markus Daft M.D.   On: 07/22/2018 12:24   Dg Chest Port 1 View  Result Date: 07/22/2018 CLINICAL DATA:  Status post right lung biopsy. Right lung mass. EXAM: PORTABLE CHEST 1 VIEW COMPARISON:  Chest x-ray dated 07/16/2018 and chest CT dated 07/03/2018 FINDINGS: Right chest tube in place. Pneumothorax. Large mass in the right mid and lower lung  zone as demonstrated on the prior CT scan 07/03/2018. Small to moderate right pleural effusion with some loculated fluid at the right apex. Heart size and vascularity are normal. Left lung is clear. No bone abnormality. IMPRESSION: No pneumothorax after right lung biopsy. Small to moderate right pleural effusion. No pneumothorax after lung biopsy. Slight increased pleural fluid on the right. No other significant change of the extensive opacification of the right hemithorax due to the previously demonstrated lung mass. Electronically Signed   By: Lorriane Shire M.D.   On: 07/22/2018 13:32   Dg Chest Port 1 View  Result Date: 07/16/2018 CLINICAL DATA:  Chest tube in place. EXAM: PORTABLE CHEST 1 VIEW COMPARISON:  07/16/2018 FINDINGS:  New right-sided chest tube projects over the right lung apex with interval decrease in moderate to large pleural effusion more so along the periphery and upper right thorax. At least half of the right hemithorax is still opacified by pleural fluid and pulmonary consolidation. Left lung is hyperinflated but clear. No acute osseous abnormality. IMPRESSION: Some interval decrease in right-sided pleural fluid is noted along the right lung apex and periphery of the right upper lobe. At least half of the right hemithorax is still opacified by presumed fluid and pulmonary consolidation along its caudal half. Electronically Signed   By: Ashley Royalty M.D.   On: 07/16/2018 19:08   Dg C-arm 1-60 Min-no Report  Result Date: 07/16/2018 Fluoroscopy was utilized by the requesting physician.  No radiographic interpretation.    Labs:  CBC: Recent Labs    07/11/18 1344 07/22/18 0932 07/24/18 0935 08/11/18 0604  WBC 11.9* 11.6* 10.8* 10.2  HGB 10.0* 10.2* 9.9* 10.3*  HCT 32.8* 34.2* 32.1* 35.0*  PLT 542* 557* 516* 573*    COAGS: Recent Labs    07/11/18 1344 07/16/18 1628 07/22/18 0932 08/11/18 0604  INR 1.03  --  1.14 1.00  APTT  --  33  --  31    BMP: Recent Labs     07/03/18 1339 07/04/18 0836 07/11/18 1344 07/24/18 0935  NA 134* 136 135 138  K 3.8 3.7 3.2* 3.4*  CL 98 100 98 98  CO2 27 27 25 29   GLUCOSE 108* 110* 98 104*  BUN 10 8 8 7   CALCIUM 9.0 8.5* 8.9 8.3*  CREATININE 0.71 0.75 0.68 0.66  GFRNONAA >60 >60 >60 >60  GFRAA >60 >60 >60 >60    LIVER FUNCTION TESTS: Recent Labs    07/03/18 1858 07/11/18 1344 07/24/18 0935  BILITOT 0.4 0.4 <0.2*  AST 27 27 26   ALT 18 27 16   ALKPHOS 105 111 137*  PROT 7.4 6.9 6.0*  ALBUMIN 3.2* 2.8* 1.9*    TUMOR MARKERS: No results for input(s): AFPTM, CEA, CA199, CHROMGRNA in the last 8760 hours.  Assessment and Plan:  Right lung mass +PET Bx 07/22/18: necrotic Scheduled now for repeat Risks and benefits discussed with the patient including, but not limited to bleeding, hemoptysis, respiratory failure requiring intubation, infection, pneumothorax requiring chest tube placement, stroke from air embolism or even death.  All of the patient's questions were answered, patient is agreeable to proceed. Consent signed and in chart.   Thank you for this interesting consult.  I greatly enjoyed meeting Brenda Patel and look forward to participating in their care.  A copy of this report was sent to the requesting provider on this date.  Electronically Signed: Lavonia Drafts, PA-C 08/11/2018, 7:18 AM   I spent a total of    25 Minutes in face to face in clinical consultation, greater than 50% of which was counseling/coordinating care for right lung mass bx

## 2018-08-11 NOTE — Discharge Instructions (Signed)
Needle Biopsy of the Lung, Care After °This sheet gives you information about how to care for yourself after your procedure. Your health care provider may also give you more specific instructions. If you have problems or questions, contact your health care provider. °What can I expect after the procedure? °After the procedure, it is common to have: °· Soreness, pain, and tenderness where a tissue sample was taken (biopsy site). °· A cough. °· A sore throat. ° °Follow these instructions at home: °Biopsy site care °· Follow instructions from your health care provider about when to remove the bandage that was placed on the biopsy site. °· Keep the bandage dry until it has been removed. °· Check your biopsy site every day for signs of infection. Check for: °? More redness, swelling, or pain. °? More fluid or blood. °? Warmth to the touch. °? Pus or a bad smell. °General instructions °· Rest as directed by your health care provider. Ask your health care provider what activities are safe for you. °· Do not take baths, swim, or use a hot tub until your health care provider approves. °· Take over-the-counter and prescription medicines only as told by your health care provider. °· If you have airplane travel scheduled, talk with your health care provider about when it is safe for you to travel by airplane. °· It is up to you to get the results of your procedure. Ask your health care provider, or the department that is doing the procedure, when your results will be ready. °· Keep all follow-up visits as told by your health care provider. This is important. °Contact a health care provider if: °· You have more redness, swelling, or pain around your biopsy site. °· You have more fluid or blood coming from your biopsy site. °· Your biopsy site feels warm to the touch. °· You have pus or a bad smell coming from your biopsy site. °· You have a fever. °· You have pain that does not get better with medicine. °Get help right away  if: °· You have problems breathing. °· You have chest pain. °· You cough up blood. °· You faint. °· You have a fast heart rate. °Summary °· After a needle biopsy of the lung, it is common to have a cough, a sore throat, or soreness, pain, and tenderness where a tissue sample was taken (biopsy site). °· You should check your biopsy area every day for signs of infection, including pus or a bad smell, warmth, more fluid or blood, or more redness, swelling, or pain. °· You should not take baths, swim, or use a hot tub until your health care provider approves. °· It is up to you to get the results of your procedure. Ask your health care provider, or the department that is doing the procedure, when your results will be ready. °This information is not intended to replace advice given to you by your health care provider. Make sure you discuss any questions you have with your health care provider. °Document Released: 06/09/2007 Document Revised: 07/03/2016 Document Reviewed: 07/03/2016 °Elsevier Interactive Patient Education © 2017 [employer] ° °Moderate Conscious Sedation, Adult, Care After °These instructions provide you with information about caring for yourself after your procedure. Your health care provider may also give you more specific instructions. Your treatment has been planned according to current medical practices, but problems sometimes occur. Call your health care provider if you have any problems or questions after your procedure. °What can I expect after the   procedure? °After your procedure, it is common: °· To feel sleepy for several hours. °· To feel clumsy and have poor balance for several hours. °· To have poor judgment for several hours. °· To vomit if you eat too soon. ° °Follow these instructions at home: °For at least 24 hours after the procedure: ° °· Do not: °? Participate in activities where you could fall or become injured. °? Drive. °? Use heavy machinery. °? Drink alcohol. °? Take sleeping  pills or medicines that cause drowsiness. °? Make important decisions or sign legal documents. °? Take care of children on your own. °· Rest. °Eating and drinking °· Follow the diet recommended by your health care provider. °· If you vomit: °? Drink water, juice, or soup when you can drink without vomiting. °? Make sure you have little or no nausea before eating solid foods. °General instructions °· Have a responsible adult stay with you until you are awake and alert. °· Take over-the-counter and prescription medicines only as told by your health care provider. °· If you smoke, do not smoke without supervision. °· Keep all follow-up visits as told by your health care provider. This is important. °Contact a health care provider if: °· You keep feeling nauseous or you keep vomiting. °· You feel light-headed. °· You develop a rash. °· You have a fever. °Get help right away if: °· You have trouble breathing. °This information is not intended to replace advice given to you by your health care provider. Make sure you discuss any questions you have with your health care provider. °Document Released: 06/02/2013 Document Revised: 01/15/2016 Document Reviewed: 12/02/2015 °Elsevier Interactive Patient Education © 2018 [employer] ° °

## 2018-08-11 NOTE — Procedures (Signed)
Interventional Radiology Procedure:   Indications: Right lung mass, prior biopsy was necrotic and need more tissue  Procedure: CT guided right lung mass biopsy  Findings: Large right lung mass.  Biopsied obtained from anterior approach based on hypermetabolic activity from prior PET  Complications: None     EBL: Minimal  Plan: Bedrest 2 hours.   CXR in one hour.     [name] R. Anselm Pancoast, MD  Pager: [phone]

## 2018-08-12 ENCOUNTER — Telehealth: Payer: Self-pay | Admitting: *Deleted

## 2018-08-12 NOTE — Telephone Encounter (Signed)
Oncology Nurse Navigator Documentation  Oncology Nurse Navigator Flowsheets 08/12/2018  Navigator Location [facility]  Navigator Encounter Type Telephone/per Dr. Julien Nordmann, I called Brenda Patel to update her that path results are not back and will follow up tomorrow with her.  She verbalized understanding.   Telephone Outgoing Call  Confirmed Diagnosis Date 08/11/2018  Treatment Phase Abnormal Scans  Barriers/Navigation Needs Education  Education Other  Interventions Education  Education Method Verbal  Acuity Level 2  Time Spent with Patient 30

## 2018-08-13 ENCOUNTER — Telehealth: Payer: Self-pay | Admitting: *Deleted

## 2018-08-13 ENCOUNTER — Telehealth: Payer: Self-pay

## 2018-08-13 NOTE — Telephone Encounter (Signed)
Selinda Eon, nurse with [facility] contacted the office [phone] requesting verification that Brenda Patel is draining from her PleurX catheter three times a week.  Patient drainage schedule currently Sunday, Tuesday, Thursday.  Verified drainage schedule and acknowledged receipt.  Advised to give the office a call when the PleurX has 3 consecutive drains of 150 ml's or less of plural fluid.  She acknowledged receipt.

## 2018-08-13 NOTE — Telephone Encounter (Signed)
Oncology Nurse Navigator Documentation  Oncology Nurse Navigator Flowsheets 08/13/2018  Navigator Location [facility]  Navigator Encounter Type Telephone/I followed up on pathology results and no results available at this time. I called and updated patient and we will call her when results available.   Telephone Outgoing Call  Treatment Phase Abnormal Scans  Barriers/Navigation Needs Education  Education Other  Interventions Education  Education Method Verbal  Acuity Level 1  Time Spent with Patient 15

## 2018-08-14 ENCOUNTER — Other Ambulatory Visit: Payer: Self-pay

## 2018-08-14 ENCOUNTER — Ambulatory Visit: Payer: BC Managed Care – PPO

## 2018-08-14 DIAGNOSIS — Z719 Counseling, unspecified: Secondary | ICD-10-CM

## 2018-08-14 DIAGNOSIS — J90 Pleural effusion, not elsewhere classified: Secondary | ICD-10-CM

## 2018-08-14 NOTE — Progress Notes (Signed)
Brenda Patel here today to have her right sided pleurX catheter drained placed by Dr. Servando Snare on 07/16/2018.  Brenda Patel stated she drains every Sunday, Tuesday, Thursday, and Friday.  Last couple of drains are as follows: 08/09/18- 850 ml's 08/11/18- 800 ml's 08/13/18- 800 ml's  Today, 600 ml's were drained.  Patient is to maintain drainage scheduled as stated above and will return to the office next Friday, 08/21/2018.

## 2018-08-17 ENCOUNTER — Telehealth: Payer: Self-pay | Admitting: *Deleted

## 2018-08-17 NOTE — Telephone Encounter (Signed)
Oncology Nurse Navigator Documentation  Oncology Nurse Navigator Flowsheets 08/17/2018  Navigator Location [facility]  Navigator Encounter Type Telephone/I followed up with Dr. Julien Nordmann on Brenda Patel pathology results.  He states he would like to see her tomorrow. I called her and gave her an appt to be seen 08/18/18.  She verbalized understanding of appt.  Telephone Outgoing Call  Treatment Phase Pre-Tx/Tx Discussion  Barriers/Navigation Needs Education;Coordination of Care  Education Other  Interventions Coordination of Care;Education  Coordination of Care Appts  Education Method Verbal  Acuity Level 2  Time Spent with Patient 15

## 2018-08-18 ENCOUNTER — Encounter: Payer: Self-pay | Admitting: Internal Medicine

## 2018-08-18 ENCOUNTER — Inpatient Hospital Stay (HOSPITAL_BASED_OUTPATIENT_CLINIC_OR_DEPARTMENT_OTHER): Payer: BC Managed Care – PPO | Admitting: Internal Medicine

## 2018-08-18 VITALS — BP 120/72 | HR 113 | Temp 98.3°F | Resp 18 | Ht 66.0 in | Wt 141.6 lb

## 2018-08-18 DIAGNOSIS — C3431 Malignant neoplasm of lower lobe, right bronchus or lung: Secondary | ICD-10-CM | POA: Diagnosis not present

## 2018-08-18 DIAGNOSIS — R918 Other nonspecific abnormal finding of lung field: Secondary | ICD-10-CM

## 2018-08-18 DIAGNOSIS — R1310 Dysphagia, unspecified: Secondary | ICD-10-CM

## 2018-08-18 DIAGNOSIS — J90 Pleural effusion, not elsewhere classified: Secondary | ICD-10-CM

## 2018-08-18 DIAGNOSIS — Z79899 Other long term (current) drug therapy: Secondary | ICD-10-CM

## 2018-08-18 NOTE — Progress Notes (Signed)
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  PRIOR THERAPY: Status post right Pleurx catheter placement for drainage of recurrent right pleural effusion.  CURRENT THERAPY: None.  INTERVAL HISTORY: Brenda Patel 57 y.o. female returns to the clinic today for follow-up visit accompanied by friend.  The patient is feeling a little bit better today with less dysphasia.  She continues to have drainage of the right pleural effusion 1 L 3 times a week.  She has no chest pain, shortness of breath except with exertion and no cough or hemoptysis.  She denied having any fever or chills.  She has no nausea, vomiting, diarrhea or constipation.  She denied having any recent weight loss or night sweats.  She had repeat CT-guided core biopsy of the large right lower lobe lung mass by interventional radiology and the final pathology was consistent with malignant neoplasm with sarcomatoid features. The core biopsies are of a malignancy neoplasm with spindle cells that have anaplastic and sarcomatoid morphology. The tumor is associated with necrosis. By immunohistochemistry, the neoplastic cells have weak and focal positivity for desmin but are negative for cytokeratin AE1/3, cytokeratin 7, cytokeratin 20, cytokeratin 5/6, Napsin-A, TTF1, SMA, SMM-1, S100, Sox-10, Melan-A and CD45. Overall, the findings are non-specific but consistent with a poorly differentiated neoplasm with sarcomatoid features. The patient is here today for evaluation and discussion of her treatment options.  MEDICAL HISTORY: Past Medical History:  Diagnosis Date  . Asthma   . Depression   . Dyspnea   . Pleural effusion on right   . Pleural mass      ALLERGIES:  is allergic to codeine and other.  MEDICATIONS:  Current Outpatient Medications  Medication Sig Dispense Refill  . Calcium Citrate 333 MG TABS Take 333 mg by mouth daily.    . [facility] 12 HOUR NASAL DECONGESTANT 120 MG 12 hr tablet Take 120 mg by mouth 2 (two) times daily.  10  . fluticasone (FLONASE) 50 MCG/ACT nasal spray Place 1 spray into both nostrils 2 (two) times daily.   5  . loratadine (CLARITIN) 10 MG tablet Take 10 mg by mouth daily.    . montelukast (SINGULAIR) 10 MG tablet Take 10 mg by mouth daily after supper.  10  . nefazodone (SERZONE) 100 MG tablet Take 100 mg by mouth 2 (two) times daily.  11  . albuterol (PROAIR HFA) 108 (90 Base) MCG/ACT inhaler Inhale 2 puffs into the lungs every 6 (six) hours as needed for wheezing or shortness of breath.     . traMADol (ULTRAM) 50 MG tablet Take 1 tablet (50 mg total) by mouth every 6 (six) hours as needed. (Patient not taking: Reported on 08/18/2018) 20 tablet 0   No current facility-administered medications for this visit.     SURGICAL HISTORY:  Past Surgical History:  Procedure Laterality Date  . CHEST TUBE INSERTION Right 07/16/2018   Procedure: INSERTION PLEURAL DRAINAGE CATHETER, right;  Surgeon: Grace Isaac, MD;  Location: [facility];  Service: Thoracic;  Laterality: Right;  . COLONOSCOPY  2017  . GUM SURGERY  1984   GRAFTS  . IR THORACENTESIS ASP PLEURAL SPACE W/IMG GUIDE  07/03/2018    REVIEW OF SYSTEMS:  Constitutional: positive for fatigue  Eyes: negative Ears, nose, mouth, throat, and face: negative Respiratory: positive for dyspnea on exertion Cardiovascular: negative Gastrointestinal: negative Genitourinary:negative Integument/breast: negative Hematologic/lymphatic: negative Musculoskeletal:negative Neurological: negative Behavioral/Psych: negative Endocrine: negative Allergic/Immunologic: negative   PHYSICAL EXAMINATION: General appearance: alert, cooperative, fatigued and no  distress Head: Normocephalic, without obvious abnormality, atraumatic Neck: no adenopathy, no JVD, supple, symmetrical, trachea midline and thyroid not enlarged, symmetric, no tenderness/mass/nodules Lymph nodes: Cervical, supraclavicular, and axillary nodes normal. Resp: diminished breath sounds RLL and dullness to percussion RLL Back: symmetric, no curvature. ROM normal. No CVA tenderness. Cardio: regular rate and rhythm, S1, S2 normal, no murmur, click, rub or gallop GI: soft, non-tender; bowel sounds normal; no masses,  no organomegaly Extremities: extremities normal, atraumatic, no cyanosis or edema Neurologic: Alert and oriented X 3, normal strength and tone. Normal symmetric reflexes. Normal coordination and gait  ECOG PERFORMANCE STATUS: 1 - Symptomatic but completely ambulatory  Blood pressure 120/72, pulse (!) 113, temperature 98.3 F (36.8 C), temperature source Oral, resp. rate 18, height 5\' 6"  (1.676 m), weight 141 lb 9.6 oz (64.2 kg), SpO2 98 %.  LABORATORY DATA: Lab Results  Component Value Date   WBC 10.2 08/11/2018   HGB 10.3 (L) 08/11/2018   HCT 35.0 (L) 08/11/2018   MCV 81.8 08/11/2018   PLT 573 (H) 08/11/2018      Chemistry      Component Value Date/Time   NA 138 07/24/2018 0935   K 3.4 (L) 07/24/2018 0935   CL 98 07/24/2018 0935   CO2 29 07/24/2018 0935   BUN 7 07/24/2018 0935   CREATININE 0.66 07/24/2018 0935      Component Value Date/Time   CALCIUM 8.3 (L) 07/24/2018 0935   ALKPHOS 137 (H) 07/24/2018 0935   AST 26 07/24/2018 0935   ALT 16 07/24/2018 0935   BILITOT <0.2 (L) 07/24/2018 0935       RADIOGRAPHIC STUDIES: Nm Pet Image Initial (pi) Skull Base To Thigh  Result Date: 08/03/2018 CLINICAL DATA:  Initial treatment strategy for lung cancer. EXAM: NUCLEAR MEDICINE PET SKULL BASE TO THIGH TECHNIQUE: 6.8 mCi F-18 FDG was injected intravenously. Full-ring PET imaging was performed from the skull base to thigh after the radiotracer. CT data was  obtained and used for attenuation correction and anatomic localization. Fasting blood glucose: 99 mg/dl COMPARISON:  CT chest 07/03/18 FINDINGS: Mediastinal blood pool activity: SUV max 1.8 NECK: No hypermetabolic lymph nodes in the neck. Incidental CT findings: none CHEST: No hypermetabolic axillary or supraclavicular lymph nodes. No hypermetabolic mediastinal or left hilar adenopathy identified. Large mass occupies the lower half of the right hemithorax. This has a maximum dimension of 14.6 cm. Heterogeneous increased uptake within the solid components of this mass are identified with an SUV max of 8.28. There is a moderate loculated right pleural effusion. Chest tube is in place.No satellite nodules identified within the adjacent lung parenchyma. No pulmonary nodule or mass identified within the left hemithorax. Incidental CT findings: none ABDOMEN/PELVIS: No abnormal hypermetabolic activity within the liver, pancreas, adrenal glands, or spleen. No hypermetabolic lymph nodes in the abdomen or pelvis. Incidental CT findings: Nonobstructing left renal calculus measures 2-3 mm. SKELETON: No focal hypermetabolic activity to suggest skeletal metastasis. Incidental CT findings: none IMPRESSION: 1. Large heterogeneous mass occupying approximately 50% of the right hemithorax is again identified. This demonstrates areas of moderate increased uptake with SUV max as high as 8.28 compatible with malignancy. Associated loculated right pleural effusion is identified with chest tube in place. 2. No hypermetabolic adenopathy  or evidence of distant metastatic disease. Electronically Signed   By: Kerby Moors M.D.   On: 08/03/2018 11:21   Ct Biopsy  Result Date: 08/11/2018 INDICATION: 56 year old with a large right lung mass. Previous CT-guided biopsy was inconclusive due to necrotic material. Subsequently, patient had a PET-CT demonstrating heterogeneous hypermetabolic activity in the right lung mass. Plan for a repeat  CT-guided biopsy targeted at the hypermetabolic areas based on previous PET-CT. EXAM: CT-GUIDED RIGHT LUNG MASS BIOPSY MEDICATIONS: None. ANESTHESIA/SEDATION: Moderate (conscious) sedation was employed during this procedure. A total of Versed 2 mg and Fentanyl 100 mcg was administered intravenously. Moderate Sedation Time: 22 minutes. The patient's level of consciousness and vital signs were monitored continuously by radiology nursing throughout the procedure under my direct supervision. FLUOROSCOPY TIME:  None COMPLICATIONS: None immediate. PROCEDURE: Informed written consent was obtained from the patient after a thorough discussion of the procedural risks, benefits and alternatives. All questions were addressed. A timeout was performed prior to the initiation of the procedure. Patient was placed supine on the CT scanner. Images through the chest were obtained. Medial and anterior aspect of the right lung mass was targeted. The right anterior chest was prepped with chlorhexidine and sterile field was created. Skin and soft tissues anesthetized with 1% lidocaine. 17 gauge coaxial needle directed into the mass with CT guidance. Needle position confirmed within the mass. A total of 6 core biopsies were obtained with an 18 gauge core device. Specimens were placed in formalin. Needle removed without complication. Bandage placed over the puncture site. FINDINGS: Again noted is a large mass occupying the right lower chest. Needle position was confirmed within the mass from an anterior approach. Six large core biopsies were obtained. Small amount of gas along the needle tract at the end of the procedure. Again noted is right pleural fluid. The patient has a tunneled right pleural catheter. IMPRESSION: CT-guided core biopsy of the right lung mass. Electronically Signed   By: Markus Daft M.D.   On: 08/11/2018 09:58   Ct Biopsy  Result Date: 07/22/2018 INDICATION: 57 year old with a large right chest pleural-based mass.  Tissue diagnosis is needed. EXAM: CT-GUIDED BIOPSY OF RIGHT CHEST/PLEURAL MASS MEDICATIONS: None. ANESTHESIA/SEDATION: Moderate (conscious) sedation was employed during this procedure. A total of Versed 1.0 mg and Fentanyl 50 mcg was administered intravenously. Moderate Sedation Time: 14 minutes. The patient's level of consciousness and vital signs were monitored continuously by radiology nursing throughout the procedure under my direct supervision. FLUOROSCOPY TIME:  None COMPLICATIONS: None immediate. PROCEDURE: Informed written consent was obtained from the patient after a thorough discussion of the procedural risks, benefits and alternatives. All questions were addressed. A timeout was performed prior to the initiation of the procedure. Patient was placed prone. CT images through the chest were obtained. The large mass in the right lower chest was targeted. The posterior right chest was prepped with chlorhexidine and sterile field was created. Skin and soft tissues anesthetized with 1% lidocaine. 17 gauge coaxial needle directed into the lesion with CT guidance. Total of 3 core biopsies were obtained with an 18 gauge core device. Specimens placed in formalin. 17 gauge needle was removed without complication. Bandage placed over the puncture site. FINDINGS: Large pleural-based mass in the right lower chest. Needle position confirmed within the lesion. No evidence for pneumothorax following the core biopsy. The PleurX catheter was identified. There is a small to moderate amount of right pleural fluid. IMPRESSION: CT-guided core biopsy of the right chest mass. Electronically Signed  By: Markus Daft M.D.   On: 07/22/2018 12:24   Dg Chest Port 1 View  Result Date: 08/11/2018 CLINICAL DATA:  Postop film for right lung biopsy today. EXAM: PORTABLE CHEST 1 VIEW COMPARISON:  July 22, 2018 FINDINGS: A right-sided chest tube remains. No pneumothorax. The left lung is clear. There is a large effusion with  underlying opacity in the right chest with decreased aeration in the right upper lobe, stable. No change in the visualized cardiomediastinal silhouette. IMPRESSION: Persistent moderate to large right pleural effusion with underlying opacity. No other change. No pneumothorax. Electronically Signed   By: Dorise Bullion III M.D   On: 08/11/2018 10:10   Dg Chest Port 1 View  Result Date: 07/22/2018 CLINICAL DATA:  Status post right lung biopsy. Right lung mass. EXAM: PORTABLE CHEST 1 VIEW COMPARISON:  Chest x-ray dated 07/16/2018 and chest CT dated 07/03/2018 FINDINGS: Right chest tube in place. Pneumothorax. Large mass in the right mid and lower lung zone as demonstrated on the prior CT scan 07/03/2018. Small to moderate right pleural effusion with some loculated fluid at the right apex. Heart size and vascularity are normal. Left lung is clear. No bone abnormality. IMPRESSION: No pneumothorax after right lung biopsy. Small to moderate right pleural effusion. No pneumothorax after lung biopsy. Slight increased pleural fluid on the right. No other significant change of the extensive opacification of the right hemithorax due to the previously demonstrated lung mass. Electronically Signed   By: Lorriane Shire M.D.   On: 07/22/2018 13:32    ASSESSMENT AND PLAN: This is a very pleasant 57 years old white female presented with large right lower lobe lung mass in addition to right pleural effusion.  The most recent pathology report was consistent with malignant neoplasm with sarcomatoid features. I had a lengthy discussion with the patient today about her current condition and treatment options. I send her tissue block to [facility] for molecular studies and PDL 1. I discussed with the patient several options for management of her condition including treatment with broad-spectrum chemotherapy with carboplatin, paclitaxel and Keytruda versus waiting for the molecular studies before proceeding with treatment  versus sending the patient for a second opinion at [facility] to see either Dr. Angelina Ok or Dr. Durenda Hurt to discuss with their tumor board and reevaluate her pathology slides as well as treatment options. After discussion of all the options the patient would like to hold on proceeding with systemic chemotherapy for now.  We will wait for the molecular studies but she would also like to proceed with the second opinion at [facility] before starting any treatment. I recommended for her to have copies of the scans on CDs before seeing the medical oncologist at [facility].  We will also try to arrange for her slides to be sent to [facility] for pathology second opinion. I will arrange for the patient to come back for follow-up visit after her visit at [facility] for more detailed discussion of her treatment options locally at home if she is not eligible for any clinical trial at [facility]. The patient was advised to call immediately if she has any concerning symptoms in the interval. The patient voices understanding of current disease status and treatment options and is in agreement with the current care plan.  All questions were answered. The patient knows to call the clinic with any problems, questions or concerns. We can certainly see the patient much sooner if necessary.  I spent 15 minutes counseling the patient  face to face. The total time spent in the appointment was 25 minutes.  Disclaimer: This note was dictated with voice recognition software. Similar sounding words can inadvertently be transcribed and may not be corrected upon review.

## 2018-08-20 ENCOUNTER — Telehealth: Payer: Self-pay | Admitting: Internal Medicine

## 2018-08-20 NOTE — Telephone Encounter (Signed)
Faxed medical records to the [facility] for a referral, Release ID: [id number]

## 2018-08-21 ENCOUNTER — Ambulatory Visit: Payer: BC Managed Care – PPO

## 2018-08-21 DIAGNOSIS — J90 Pleural effusion, not elsewhere classified: Secondary | ICD-10-CM

## 2018-08-21 DIAGNOSIS — Z719 Counseling, unspecified: Secondary | ICD-10-CM

## 2018-08-21 NOTE — Progress Notes (Signed)
Drained 750 cc's from right pleurX drain. No signs of infection and patient tolerated well. She will return next Friday for drainage pleurX,

## 2018-08-24 ENCOUNTER — Encounter: Payer: Self-pay | Admitting: *Deleted

## 2018-08-24 NOTE — Progress Notes (Signed)
Oncology Nurse Navigator Documentation  Oncology Nurse Navigator Flowsheets 08/24/2018  Navigator Location [facility]  Navigator Encounter Type Other/per Dr. Julien Nordmann I requested [facility] sarcoma panel and PDL1 to be sent on recent pathology [phone].   Treatment Phase Pre-Tx/Tx Discussion  Barriers/Navigation Needs Coordination of Care  Interventions Coordination of Care  Coordination of Care Other  Acuity Level 2  Time Spent with Patient 30

## 2018-08-25 ENCOUNTER — Telehealth: Payer: Self-pay | Admitting: Medical Oncology

## 2018-08-25 NOTE — Telephone Encounter (Signed)
Pt transferred to thoracic surgery team (  Dr Williemae Natter) and pt will see him jan 9th as well as  see xrt and have PFT's.

## 2018-08-28 ENCOUNTER — Telehealth: Payer: Self-pay | Admitting: Medical Oncology

## 2018-08-28 ENCOUNTER — Ambulatory Visit: Payer: BC Managed Care – PPO

## 2018-08-28 DIAGNOSIS — J90 Pleural effusion, not elsewhere classified: Secondary | ICD-10-CM

## 2018-08-28 DIAGNOSIS — Z719 Counseling, unspecified: Secondary | ICD-10-CM

## 2018-08-28 NOTE — Progress Notes (Signed)
Drained 400 cc's from right PleurX drain. Patient said that the area has been itching where the clear dressing cover is placed. I prepped the area with non sting skin wipes before placing the PleurX dressing cover. Patient tolerated well. She is scheduled to see MD at [facility] next Thursday.

## 2018-08-28 NOTE — Telephone Encounter (Signed)
Pt wants to make sure [facility] aware of appt at [facility] -I told her that he is aware.  She asked if specimen sent for genetic testing . Per pathology Varney Biles is was sent  Aug 27, 2017 to [facility] [place]. Pt notified.

## 2018-08-28 NOTE — Telephone Encounter (Signed)
I called pathology to see when specimen sent to [facility].

## 2018-09-03 ENCOUNTER — Encounter (HOSPITAL_COMMUNITY): Payer: Self-pay | Admitting: Internal Medicine

## 2018-09-04 ENCOUNTER — Telehealth: Payer: Self-pay | Admitting: *Deleted

## 2018-09-04 NOTE — Telephone Encounter (Signed)
Received TC from patient requesting advice about prescriptions she received yesterday from a Dr. Carlye Grippe @ [facility] for additional radiology exams (CXR, V/Q scan and cardiac MRI)  She was told to have them done here in [place], but was unsure of where to go.  Advised to go to [facility] on [address] her to take prescriptions there and to make sure they know  To fax results to the MD @ [facility]. Takia voiced understanding.

## 2018-09-07 ENCOUNTER — Other Ambulatory Visit (HOSPITAL_COMMUNITY): Payer: Self-pay | Admitting: Family Medicine

## 2018-09-07 DIAGNOSIS — R918 Other nonspecific abnormal finding of lung field: Secondary | ICD-10-CM

## 2018-09-08 ENCOUNTER — Other Ambulatory Visit (HOSPITAL_COMMUNITY): Payer: Self-pay | Admitting: Family Medicine

## 2018-09-08 DIAGNOSIS — R918 Other nonspecific abnormal finding of lung field: Secondary | ICD-10-CM

## 2018-09-09 ENCOUNTER — Encounter (HOSPITAL_COMMUNITY): Payer: Self-pay | Admitting: Internal Medicine

## 2018-09-10 ENCOUNTER — Other Ambulatory Visit (HOSPITAL_COMMUNITY): Payer: BC Managed Care – PPO

## 2018-09-11 ENCOUNTER — Ambulatory Visit: Payer: BC Managed Care – PPO

## 2018-09-11 DIAGNOSIS — J90 Pleural effusion, not elsewhere classified: Secondary | ICD-10-CM | POA: Diagnosis not present

## 2018-09-11 DIAGNOSIS — Z719 Counseling, unspecified: Secondary | ICD-10-CM | POA: Diagnosis not present

## 2018-09-11 NOTE — Progress Notes (Signed)
550 cc's drained today from right pleurX catheter. Brenda Patel states that she is scheduled to surgery at [facility] next week 09/15/2018 for resection of mass.

## 2018-09-16 DIAGNOSIS — Z4803 Encounter for change or removal of drains: Secondary | ICD-10-CM

## 2018-09-16 DIAGNOSIS — J90 Pleural effusion, not elsewhere classified: Secondary | ICD-10-CM

## 2018-09-17 ENCOUNTER — Telehealth: Payer: Self-pay | Admitting: *Deleted

## 2018-09-17 NOTE — Telephone Encounter (Signed)
TC from patient inquiring about her [facility] testing. She is scheduled for surgery at [facility] on 09/23/18. She would like to know if results of [facility] testing will impact her surgery-whether she should go ahead with it or not, whether treatment to shrink tumor would be done prior to surgery.  Please call patient

## 2018-09-17 NOTE — Telephone Encounter (Signed)
TCT patient and relayed to her that Dr. Julien Nordmann has seen [facility] results and that it is appropriate for her to go ahead with surgery next week. Pt voiced understanding. She is anxious to get the surgery over with.  Wished her luck for next week.

## 2018-09-18 ENCOUNTER — Encounter: Payer: BC Managed Care – PPO | Admitting: *Deleted

## 2018-09-18 DIAGNOSIS — Z719 Counseling, unspecified: Secondary | ICD-10-CM

## 2018-09-18 DIAGNOSIS — J90 Pleural effusion, not elsewhere classified: Secondary | ICD-10-CM

## 2019-03-23 IMAGING — PT NM PET TUM IMG INITIAL (PI) SKULL BASE T - THIGH
1 of 8 series · 1 of 25 positions shown · non-contrast
Comparison: CT chest 07/03/18

CLINICAL DATA: Initial treatment strategy for lung cancer.

EXAM:
NUCLEAR MEDICINE PET SKULL BASE TO THIGH
TECHNIQUE: 6.8 mCi F-18 FDG was injected intravenously. Full-ring PET imaging
was performed from the skull base to thigh after the radiotracer. CT
data was obtained and used for attenuation correction and anatomic
localization.
Fasting blood glucose: 99 mg/dl

[Series 4: ct sk_thigh 5.0 b31f · axial · 5.0mm · 0.93mm/px · 1 of 219 slices shown]
[im 219/219  brain]
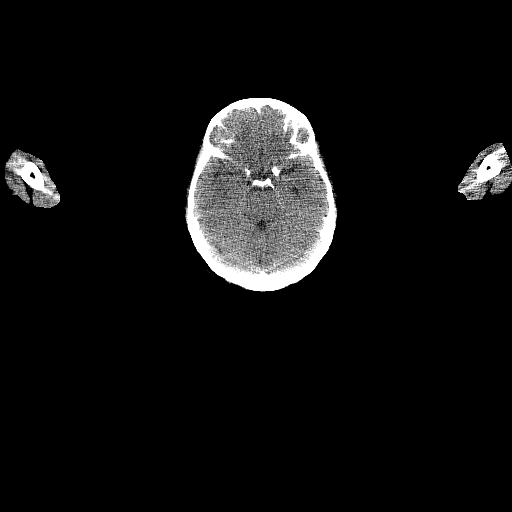

[1 of 25 positions shown; findings below may reference images not displayed]

FINDINGS: Mediastinal blood pool activity: SUV max

NECK: No hypermetabolic lymph nodes in the neck.

Incidental CT findings: none

CHEST: No hypermetabolic axillary or supraclavicular lymph nodes. No
hypermetabolic mediastinal or left hilar adenopathy identified.

Large mass occupies the lower half of the right hemithorax. This has
a maximum dimension of 14.6 cm. Heterogeneous increased uptake
within the solid components of this mass are identified with an SUV
max of 8.28. There is a moderate loculated right pleural effusion.
Chest tube is in place.No satellite nodules identified within the
adjacent lung parenchyma. No pulmonary nodule or mass identified
within the left hemithorax.

Incidental CT findings: none

ABDOMEN/PELVIS: No abnormal hypermetabolic activity within the
liver, pancreas, adrenal glands, or spleen. No hypermetabolic lymph
nodes in the abdomen or pelvis.

Incidental CT findings: Nonobstructing left renal calculus measures
2-3 mm.

SKELETON: No focal hypermetabolic activity to suggest skeletal
metastasis.

Incidental CT findings: none
IMPRESSION: 1. Large heterogeneous mass occupying approximately 50% of the right
hemithorax is again identified. This demonstrates areas of moderate
increased uptake with SUV max as high as 8.28 compatible with
malignancy. Associated loculated right pleural effusion is
identified with chest tube in place.
2. No hypermetabolic adenopathy or evidence of distant metastatic
disease.

## 2019-10-21 ENCOUNTER — Other Ambulatory Visit: Payer: Self-pay

## 2019-10-21 ENCOUNTER — Encounter (HOSPITAL_COMMUNITY): Payer: Self-pay | Admitting: Emergency Medicine

## 2019-10-21 ENCOUNTER — Emergency Department (HOSPITAL_COMMUNITY): Payer: BC Managed Care – PPO

## 2019-10-21 ENCOUNTER — Emergency Department (HOSPITAL_COMMUNITY)
Admission: EM | Admit: 2019-10-21 | Discharge: 2019-10-21 | Disposition: A | Discharge status: Home / Self Care | Payer: BC Managed Care – PPO | Attending: Emergency Medicine | Admitting: Emergency Medicine

## 2019-10-21 DIAGNOSIS — N200 Calculus of kidney: Secondary | ICD-10-CM | POA: Insufficient documentation

## 2019-10-21 DIAGNOSIS — N130 Hydronephrosis with ureteropelvic junction obstruction: Secondary | ICD-10-CM | POA: Insufficient documentation

## 2019-10-21 DIAGNOSIS — Q6211 Congenital occlusion of ureteropelvic junction: Secondary | ICD-10-CM

## 2019-10-21 DIAGNOSIS — Z79899 Other long term (current) drug therapy: Secondary | ICD-10-CM | POA: Insufficient documentation

## 2019-10-21 DIAGNOSIS — Z859 Personal history of malignant neoplasm, unspecified: Secondary | ICD-10-CM | POA: Diagnosis not present

## 2019-10-21 DIAGNOSIS — J45909 Unspecified asthma, uncomplicated: Secondary | ICD-10-CM | POA: Diagnosis not present

## 2019-10-21 DIAGNOSIS — R1032 Left lower quadrant pain: Secondary | ICD-10-CM | POA: Diagnosis present

## 2019-10-21 HISTORY — DX: Malignant (primary) neoplasm, unspecified: C80.1

## 2019-10-21 LAB — COMPREHENSIVE METABOLIC PANEL
ALT: 17 U/L (ref 0–44)
AST: 24 U/L (ref 15–41)
Albumin: 4.2 g/dL (ref 3.5–5.0)
Alkaline Phosphatase: 88 U/L (ref 38–126)
Anion gap: 12 (ref 5–15)
BUN: 13 mg/dL (ref 6–20)
CO2: 25 mmol/L (ref 22–32)
Calcium: 9.3 mg/dL (ref 8.9–10.3)
Chloride: 103 mmol/L (ref 98–111)
Creatinine, Ser: 0.98 mg/dL (ref 0.44–1.00)
GFR calc Af Amer: >60 mL/min (ref >60)
GFR calc non Af Amer: >60 mL/min (ref >60)
Glucose, Bld: 136 mg/dL — ABNORMAL HIGH (ref 70–99)
Potassium: 3.8 mmol/L (ref 3.5–5.1)
Sodium: 140 mmol/L (ref 135–145)
Total Bilirubin: 0.7 mg/dL (ref 0.3–1.2)
Total Protein: 7.2 g/dL (ref 6.5–8.1)

## 2019-10-21 LAB — URINALYSIS, ROUTINE W REFLEX MICROSCOPIC
Bacteria, UA: NONE SEEN
Bilirubin Urine: NEGATIVE
Glucose, UA: NEGATIVE mg/dL
Hgb urine dipstick: NEGATIVE
Ketones, ur: 5 mg/dL — AB
Nitrite: NEGATIVE
Protein, ur: NEGATIVE mg/dL
Specific Gravity, Urine: 1.014 (ref 1.005–1.030)
pH: 8 (ref 5.0–8.0)

## 2019-10-21 LAB — I-STAT BETA HCG BLOOD, ED (MC, WL, AP ONLY): I-stat hCG, quantitative: <5 m[IU]/mL (ref <5)

## 2019-10-21 LAB — CBC
HCT: 39.6 % (ref 36.0–46.0)
Hemoglobin: 12.8 g/dL (ref 12.0–15.0)
MCH: 29.6 pg (ref 26.0–34.0)
MCHC: 32.3 g/dL (ref 30.0–36.0)
MCV: 91.7 fL (ref 80.0–100.0)
Platelets: 256 10*3/uL (ref 150–400)
RBC: 4.32 MIL/uL (ref 3.87–5.11)
RDW: 12.2 % (ref 11.5–15.5)
WBC: 12.9 10*3/uL — ABNORMAL HIGH (ref 4.0–10.5)
nRBC: 0 % (ref 0.0–0.2)

## 2019-10-21 LAB — LIPASE, BLOOD: Lipase: 46 U/L (ref 11–51)

## 2019-10-21 MED ORDER — ONDANSETRON HCL 4 MG/2ML IJ SOLN
4.0000 mg | Freq: Once | INTRAMUSCULAR | Status: AC
  Administered 2019-10-21: 04:00:00 4 mg via INTRAVENOUS
  Filled 2019-10-21: qty 2

## 2019-10-21 MED ORDER — FENTANYL CITRATE (PF) 100 MCG/2ML IJ SOLN
100.0000 ug | Freq: Once | INTRAMUSCULAR | Status: AC
  Administered 2019-10-21: 04:00:00 100 ug via INTRAVENOUS
  Filled 2019-10-21: qty 2

## 2019-10-21 MED ORDER — SODIUM CHLORIDE 0.9% FLUSH
3.0000 mL | Freq: Once | INTRAVENOUS | Status: AC
  Administered 2019-10-21: 3 mL via INTRAVENOUS

## 2019-10-21 MED ORDER — IOHEXOL 300 MG/ML  SOLN
100.0000 mL | Freq: Once | INTRAMUSCULAR | Status: AC | PRN
  Administered 2019-10-21: 05:00:00 100 mL via INTRAVENOUS

## 2019-10-21 NOTE — ED Triage Notes (Signed)
Patient reports LLQ abdominal pain radiating to left lower back onset this evening , no emesis or diarrhea , no fever or chills .

## 2019-10-21 NOTE — ED Notes (Signed)
Back from CT

## 2019-10-21 NOTE — ED Provider Notes (Signed)
[facility] EMERGENCY DEPARTMENT Provider Note   CSN: [id number] Arrival date & time: 10/21/19  4656     History Chief Complaint  Patient presents with  . Abdominal Pain    Brenda Patel is a 59 y.o. female.  The history is provided by the patient.  Abdominal Pain Pain location:  LLQ Pain quality: sharp   Pain radiates to:  Back Pain severity:  Severe Onset quality:  Sudden Timing:  Constant Progression:  Worsening Chronicity:  New Relieved by:  Nothing Worsened by:  Palpation and movement Associated symptoms: no chest pain, no chills, no diarrhea, no dysuria, no fever, no hematuria and no vomiting   Patient with previous history of fibrous tumor of the right pleura, history of recurrent pleural effusions, presents with left lower quadrant abdominal pain.  She reports this pain seemed to worsen over the past several hours at home.  No fevers or vomiting.  She does not recall having this pain previously.  She has had normal bowel movements.  No difficulty urinating or dysuria She reports being very active yesterday without any difficulty    Past Medical History:  Diagnosis Date  . Asthma   . Cancer ([place])   . Depression   . Dyspnea   . Pleural effusion on right   . Pleural mass     Patient Active Problem List   Diagnosis Date Noted  . Pleural effusion on right   . Pleural mass   . Lung mass 07/03/2018  . Pleural effusion 07/03/2018  . Anemia 07/03/2018  . Thrombocytosis ([place]) 07/03/2018  . Seasonal allergies 07/03/2018    Past Surgical History:  Procedure Laterality Date  . CHEST TUBE INSERTION Right 07/16/2018   Procedure: INSERTION PLEURAL DRAINAGE CATHETER, right;  Surgeon: Grace Isaac, MD;  Location: [facility];  Service: Thoracic;  Laterality: Right;  . COLONOSCOPY  2017  . GUM SURGERY  1984   GRAFTS  . IR THORACENTESIS ASP PLEURAL SPACE W/IMG GUIDE  07/03/2018     OB History   No obstetric history on file.     No family  history on file.  Social History   Tobacco Use  . Smoking status: Never Smoker  . Smokeless tobacco: Never Used  Substance Use Topics  . Alcohol use: Yes    Comment: 3-4 glasses of wine per month/liquor 1x monthly  . Drug use: Never    Home Medications Prior to Admission medications   Medication Sig Start Date End Date Taking? Authorizing Provider  albuterol (PROAIR HFA) 108 (90 Base) MCG/ACT inhaler Inhale 2 puffs into the lungs every 6 (six) hours as needed for wheezing or shortness of breath.     [provider]  Calcium Citrate 333 MG TABS Take 333 mg by mouth daily.    [provider]  CVS 12 HOUR NASAL DECONGESTANT 120 MG 12 hr tablet Take 120 mg by mouth 2 (two) times daily. 05/31/18   [provider]  fluticasone (FLONASE) 50 MCG/ACT nasal spray Place 1 spray into both nostrils 2 (two) times daily.  05/05/18   [provider]  loratadine (CLARITIN) 10 MG tablet Take 10 mg by mouth daily.    [provider]  montelukast (SINGULAIR) 10 MG tablet Take 10 mg by mouth daily after supper. 06/07/18   [provider]  nefazodone (SERZONE) 100 MG tablet Take 100 mg by mouth 2 (two) times daily. 06/07/18   [provider]    Allergies    Codeine  and Other  Review of Systems   Review of Systems  Constitutional: Negative for chills and fever.  Cardiovascular: Negative for chest pain.  Gastrointestinal: Positive for abdominal pain. Negative for diarrhea and vomiting.  Genitourinary: Negative for dysuria and hematuria.  All other systems reviewed and are negative.   Physical Exam Updated Vital Signs BP (!) 158/97   Pulse 75   Temp 98.9 F (37.2 C) (Oral)   Resp 17   Ht 1.676 m (5\' 6" )   Wt 65 kg   SpO2 100%   BMI 23.13 kg/m   Physical Exam CONSTITUTIONAL: Well developed/well nourished, anxious HEAD: Normocephalic/atraumatic EYES: EOMI/PERRL ENMT: Mucous membranes moist NECK: supple no meningeal  signs SPINE/BACK:entire spine nontender CV: S1/S2 noted, no murmurs/rubs/gallops noted LUNGS: Lungs are clear to auscultation bilaterally, no apparent distress ABDOMEN: soft, significant LLQ tenderness to palpation, no rebound or guarding, bowel sounds noted throughout abdomen GU:no cva tenderness NEURO: Pt is awake/alert/appropriate, moves all extremitiesx4.  No facial droop.   EXTREMITIES: pulses normal/equal, full ROM SKIN: warm, color normal PSYCH: Anxious  ED Results / Procedures / Treatments   Labs (all labs ordered are listed, but only abnormal results are displayed) Labs Reviewed  COMPREHENSIVE METABOLIC PANEL - Abnormal; Notable for the following components:      Result Value   Glucose, Bld 136 (*)    All other components within normal limits  CBC - Abnormal; Notable for the following components:   WBC 12.9 (*)    All other components within normal limits  URINALYSIS, ROUTINE W REFLEX MICROSCOPIC - Abnormal; Notable for the following components:   APPearance CLOUDY (*)    Ketones, ur 5 (*)    Leukocytes,Ua TRACE (*)    All other components within normal limits  LIPASE, BLOOD  I-STAT BETA HCG BLOOD, ED (MC, [facility], [facility] ONLY)    EKG None  Radiology CT ABDOMEN PELVIS W CONTRAST  Result Date: 10/21/2019 CLINICAL DATA:  Acute nonlocalized abdominal pain EXAM: CT ABDOMEN AND PELVIS WITH CONTRAST TECHNIQUE: Multidetector CT imaging of the abdomen and pelvis was performed using the standard protocol following bolus administration of intravenous contrast. CONTRAST:  135mL OMNIPAQUE IOHEXOL 300 MG/ML  SOLN COMPARISON:  PET-CT 08/03/2018 FINDINGS: Lower chest: Architectural distortion in the left lung base. Additional bandlike areas of scarring in the right lower and middle lobes. No consolidative opacity. No suspicious nodules or masses. Normal heart size. No pericardial effusion. Hepatobiliary: No focal liver abnormality is seen. No gallstones, gallbladder wall thickening, or biliary  dilatation. Pancreas: Unremarkable. No pancreatic ductal dilatation or surrounding inflammatory changes. Spleen: Normal in size without focal abnormality. Adrenals/Urinary Tract: Normal adrenal glands. There is asymmetric moderate left hydroureteronephrosis to the level of a 4 mm calculus at the left ureterovesicular junction. Small amount of left perinephric low-attenuation fluid concerning for a forniceal rupture. No other urolithiasis or urinary tract dilatation. Subcentimeter hypoattenuating lesion in the right kidney, likely cyst (3/25). Urinary bladder is unremarkable aside from the ureterovesicular calculus as above. Stomach/Bowel: Distal esophagus, stomach and duodenal sweep are unremarkable. No small bowel wall thickening or dilatation. No evidence of obstruction. Cecum is displaced to the midline abdomen. Large volume of stool is noted within the cecum. Appendix is not well visualized. No focal pericecal inflammation is suggest occult appendicitis however. Tortuosity of the ascending and transverse colon. Question some mild edematous mural thickening from the splenic flexure through the rectum. No extraluminal fluid, gas or abscess. Vascular/Lymphatic: The aorta is normal caliber. Reactive lymph nodes in the retroperitoneum and  upper mesentery. No pathologically enlarged nodes in the abdomen or pelvis. Reproductive: Anteverted uterus. No concerning adnexal lesions. Other: Retroperitoneal free fluid much of which appears centered within the left perirenal and anterior pararenal spaces. Tracking along the left ureter to the left pelvic sidewall. No intraperitoneal free fluid or air. No bowel containing hernias. Musculoskeletal: Multilevel degenerative changes are present in the imaged portions of the spine. No acute osseous abnormality or suspicious osseous lesion. IMPRESSION: 1. Moderate left hydroureteronephrosis to the level of a 4 mm calculus at the left ureterovesicular junction. Small amount of left  perinephric low-attenuation fluid concerning for a forniceal rupture with fluid tracking throughout the left retroperitoneum and along the left ureter. 2. Question some mild edematous mural thickening from the splenic flexure through the rectum may represent infectious or inflammatory colitis. Electronically Signed   By: Lovena Le M.D.   On: 10/21/2019 05:29    Procedures Procedures    Medications Ordered in ED Medications  sodium chloride flush (NS) 0.9 % injection 3 mL (3 mLs Intravenous Given 10/21/19 0420)  fentaNYL (SUBLIMAZE) injection 100 mcg (100 mcg Intravenous Given 10/21/19 0420)  ondansetron (ZOFRAN) injection 4 mg (4 mg Intravenous Given 10/21/19 0418)  iohexol (OMNIPAQUE) 300 MG/ML solution 100 mL (100 mLs Intravenous Contrast Given 10/21/19 0450)    ED Course  I have reviewed the triage vital signs and the nursing notes.  Pertinent labs & imaging results that were available during my care of the patient were reviewed by me and considered in my medical decision making (see chart for details).    MDM Rules/Calculators/A&P                      Patient presents with onset of left lower quadrant back pain.  She appears very comfortable on initial exam.  Urinalysis was negative.  CT imaging reveals 4 mm stone at the UVJ, with some hydronephrosis and questionable forniceal rupture Patient's pain is nearly resolved.  She is in no acute distress and ambulatory.  She is not septic appearing. No signs of infectious etiology She is appropriate for discharge home Final Clinical Impression(s) / ED Diagnoses Final diagnoses:  Kidney stone  Hydronephrosis with ureteropelvic junction (UPJ) obstruction    Rx / DC Orders ED Discharge Orders    None       Ripley Fraise, MD 10/21/19 [phone]

## 2020-12-08 ENCOUNTER — Ambulatory Visit: Payer: Self-pay | Attending: Internal Medicine

## 2020-12-08 ENCOUNTER — Other Ambulatory Visit: Payer: Self-pay

## 2020-12-08 DIAGNOSIS — Z23 Encounter for immunization: Secondary | ICD-10-CM

## 2020-12-08 NOTE — Progress Notes (Signed)
   Covid-19 Vaccination Clinic  Name:  DELBERT Patel    MRN: [id number] DOB: 07-19-1961  12/08/2020  [name] was observed post Covid-19 immunization for 15 minutes without incident. She was provided with Vaccine Information Sheet and instruction to access the V-Safe system.   [name] was instructed to call 911 with any severe reactions post vaccine: Marland Kitchen Difficulty breathing  . Swelling of face and throat  . A fast heartbeat  . A bad rash all over body  . Dizziness and weakness   Immunizations Administered    Name Date Dose VIS Date Route   PFIZER Comrnaty(Gray TOP) Covid-19 Vaccine 12/08/2020  1:21 PM 0.3 mL 08/03/2020 Intramuscular   Manufacturer: [employer]   Lot: [id number]   NDC: [id number]

## 2020-12-11 ENCOUNTER — Other Ambulatory Visit (HOSPITAL_BASED_OUTPATIENT_CLINIC_OR_DEPARTMENT_OTHER): Payer: Self-pay

## 2020-12-11 MED ORDER — COVID-19 MRNA VACCINE (PFIZER) 30 MCG/0.3ML IM SUSP
INTRAMUSCULAR | 0 refills | Status: DC
  Filled 2020-12-11: qty 0.3, 1d supply, fill #0

## 2021-06-05 ENCOUNTER — Telehealth: Payer: Self-pay | Admitting: Internal Medicine

## 2021-06-05 NOTE — Telephone Encounter (Signed)
Scheduled appt per 10/11 referral. Pt is aware of appt date and time.

## 2021-06-11 ENCOUNTER — Ambulatory Visit: Payer: Self-pay | Attending: Internal Medicine

## 2021-06-11 ENCOUNTER — Ambulatory Visit: Payer: Self-pay

## 2021-06-11 DIAGNOSIS — Z23 Encounter for immunization: Secondary | ICD-10-CM

## 2021-07-03 ENCOUNTER — Other Ambulatory Visit: Payer: Self-pay

## 2021-07-03 ENCOUNTER — Inpatient Hospital Stay: Payer: BC Managed Care – PPO | Attending: Internal Medicine | Admitting: Internal Medicine

## 2021-07-03 ENCOUNTER — Encounter: Payer: Self-pay | Admitting: Internal Medicine

## 2021-07-03 DIAGNOSIS — Z885 Allergy status to narcotic agent status: Secondary | ICD-10-CM | POA: Diagnosis not present

## 2021-07-03 DIAGNOSIS — R5383 Other fatigue: Secondary | ICD-10-CM | POA: Insufficient documentation

## 2021-07-03 DIAGNOSIS — R0609 Other forms of dyspnea: Secondary | ICD-10-CM | POA: Diagnosis not present

## 2021-07-03 DIAGNOSIS — D492 Neoplasm of unspecified behavior of bone, soft tissue, and skin: Secondary | ICD-10-CM

## 2021-07-03 DIAGNOSIS — R918 Other nonspecific abnormal finding of lung field: Secondary | ICD-10-CM | POA: Insufficient documentation

## 2021-07-03 DIAGNOSIS — Z79899 Other long term (current) drug therapy: Secondary | ICD-10-CM | POA: Diagnosis not present

## 2021-07-03 DIAGNOSIS — J45909 Unspecified asthma, uncomplicated: Secondary | ICD-10-CM | POA: Insufficient documentation

## 2021-07-03 DIAGNOSIS — J90 Pleural effusion, not elsewhere classified: Secondary | ICD-10-CM | POA: Diagnosis not present

## 2021-07-03 DIAGNOSIS — M2550 Pain in unspecified joint: Secondary | ICD-10-CM | POA: Diagnosis not present

## 2021-07-03 NOTE — Progress Notes (Signed)
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  PRIOR THERAPY:  1) Status post right Pleurx catheter placement for drainage of recurrent right pleural effusion. 2) repeat biopsy on August 11, 2018 of the lung mass at [facility] and it showed atypical spindle cell proliferation. 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 4) restaging CT scan of the chest on September 14, 2019 showed new 1.5 cm soft tissue lesion along the right subpulmonic diaphragmatic pleural surface worrisome for tumor recurrence.  She was followed by observation by Dr. Angelina Ok at [facility] 5) SBRT to the right lower lobe/diaphragmatic lesion completed June 22, 2020. 6) restaging scan on November 30, 2020 showed new right posterior pleural-based nodule followed by observation.  But repeat CT scan of the chest on 02/07/2021 showed enlarging right pleural mass/nodules. 7) the patient is started treatment with pazopanib on 02/17/2021 400 mg titrated to 800 mg p.o. daily but her treatment was held between July 8 to March 05, 2021 secondary to fatigue and arthralgia.  She resumed her treatment at a dose of 400 mg p.o. daily for 3 days titrated to 600 mg p.o. daily but this was again held in mid August 2022.  She resumed her treatment on 04/23/2021 at 400 mg p.o. daily but this was discontinued in October 2022 secondary to disease progression was a scan on 05/30/2021 revealing progressive multifocal pleural-based  disease.  CURRENT THERAPY: None but she is expected to start treatment with Avastin and Temodar in January 2023 under the care of Dr. Angelina Ok at [facility].  INTERVAL HISTORY: Brenda Patel 60 y.o. female returns to the clinic today for follow-up visit.  She was last seen in our office on August 15, 2018.  She was referred to Dr. Angelina Ok at that time for management of her fibrous tumor of the lung.  She underwent several studies and resections as dictated in the prior therapy section.  The patient also was treated with pazopanib which was interrupted several times secondary to toxicity with increasing fatigue and weakness as well as disease progression in October 2022.  She had a discussion with Dr. Angelina Ok about her treatment and he is considering her for treatment with Avastin every 2 weeks in addition to Temodar.  The patient requested to delay her treatment until after the holiday season.  She may consider receiving her treatment locally at home if there is a hassle with the travel to [facility]. She is here today to reestablish care with me.  She is feeling much better with no concerning complaints.  She is recovering from all the adverse effect of her treatment. She denied having any current chest pain, shortness of breath, cough or hemoptysis.  She has no fever or chills.  She has no nausea, vomiting, diarrhea or constipation.  She has no headache or visual changes.  MEDICAL HISTORY: Past Medical History:  Diagnosis Date   Asthma    Cancer ([place])    Depression    Dyspnea    Pleural effusion on right  Pleural mass     ALLERGIES:  is allergic to codeine and other.  MEDICATIONS:  Current Outpatient Medications  Medication Sig Dispense Refill   Calcium Citrate 333 MG TABS Take 333 mg by mouth daily.     COVID-19 mRNA vaccine, Pfizer, 30 MCG/0.3ML injection Inject into the muscle. 0.3 mL 0   CVS 12 HOUR NASAL DECONGESTANT 120 MG 12 hr tablet Take 120 mg by mouth 2 (two)  times daily.  10   fluticasone (FLONASE) 50 MCG/ACT nasal spray Place 1 spray into both nostrils 2 (two) times daily.   5   levothyroxine (SYNTHROID) 50 MCG tablet Take 50 mcg by mouth daily.     lisinopril (ZESTRIL) 20 MG tablet Take 20 mg by mouth daily.     meloxicam (MOBIC) 15 MG tablet Take 15 mg by mouth daily as needed for pain.      montelukast (SINGULAIR) 10 MG tablet Take 10 mg by mouth daily after supper.  10   No current facility-administered medications for this visit.    SURGICAL HISTORY:  Past Surgical History:  Procedure Laterality Date   CHEST TUBE INSERTION Right 07/16/2018   Procedure: INSERTION PLEURAL DRAINAGE CATHETER, right;  Surgeon: Grace Isaac, MD;  Location: [facility];  Service: Thoracic;  Laterality: Right;   COLONOSCOPY  2017   GUM SURGERY  1984   GRAFTS   IR THORACENTESIS ASP PLEURAL SPACE W/IMG GUIDE  07/03/2018    REVIEW OF SYSTEMS:  Constitutional: negative Eyes: negative Ears, nose, mouth, throat, and face: negative Respiratory: positive for dyspnea on exertion Cardiovascular: negative Gastrointestinal: negative Genitourinary:negative Integument/breast: negative Hematologic/lymphatic: negative Musculoskeletal:negative Neurological: negative Behavioral/Psych: negative Endocrine: negative Allergic/Immunologic: negative   PHYSICAL EXAMINATION: General appearance: alert, cooperative, and no distress Head: Normocephalic, without obvious abnormality, atraumatic Neck: no adenopathy, no JVD, supple, symmetrical, trachea midline, and thyroid not enlarged, symmetric, no tenderness/mass/nodules Lymph nodes: Cervical, supraclavicular, and axillary nodes normal. Resp: clear to auscultation bilaterally Back: symmetric, no curvature. ROM normal. No CVA tenderness. Cardio: regular rate and rhythm, S1, S2 normal, no murmur, click, rub or gallop GI: soft, non-tender; bowel sounds normal; no masses,  no organomegaly Extremities: extremities normal,  atraumatic, no cyanosis or edema Neurologic: Alert and oriented X 3, normal strength and tone. Normal symmetric reflexes. Normal coordination and gait  ECOG PERFORMANCE STATUS: 1 - Symptomatic but completely ambulatory  Blood pressure (!) 153/78, pulse (!) 101, temperature 98.4 F (36.9 C), temperature source Tympanic, resp. rate 19, height 5\' 6"  (1.676 m), weight 156 lb 6.4 oz (70.9 kg), SpO2 98 %.  LABORATORY DATA: Lab Results  Component Value Date   WBC 12.9 (H) 10/21/2019   HGB 12.8 10/21/2019   HCT 39.6 10/21/2019   MCV 91.7 10/21/2019   PLT 256 10/21/2019      Chemistry      Component Value Date/Time   NA 140 10/21/2019 0336   K 3.8 10/21/2019 0336   CL 103 10/21/2019 0336   CO2 25 10/21/2019 0336   BUN 13 10/21/2019 0336   CREATININE 0.98 10/21/2019 0336   CREATININE 0.66 07/24/2018 0935      Component Value Date/Time   CALCIUM 9.3 10/21/2019 0336   ALKPHOS 88 10/21/2019 0336   AST 24 10/21/2019 0336   AST 26 07/24/2018 0935   ALT 17 10/21/2019 0336   ALT 16 07/24/2018 0935   BILITOT 0.7 10/21/2019 0336   BILITOT <0.2 (L) 07/24/2018 0935       RADIOGRAPHIC STUDIES: No results found.   ASSESSMENT  AND PLAN: This is a very pleasant 60 years old white female presented with large right lower lobe lung mass in addition to right pleural effusion.  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 The patient was referred to Dr. Angelina Ok at [place] [facility] for second opinion and she underwent several studies and intervention as listed below 1) repeat biopsy on August 11, 2018 of the lung mass at [facility] and it showed atypical spindle cell proliferation. 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 3)  restaging CT scan of the chest on September 14, 2019 showed new 1.5 cm soft tissue lesion along the right subpulmonic diaphragmatic pleural surface worrisome for tumor recurrence.  She was followed by observation by Dr. Angelina Ok at [place] [facility] 4) SBRT to the right lower lobe/diaphragmatic lesion completed June 22, 2020. 5) restaging scan on November 30, 2020 showed new right posterior pleural-based nodule followed by observation.  But repeat CT scan of the chest on 02/07/2021 showed enlarging right pleural mass/nodules. 6) the patient is started treatment with pazopanib on 02/17/2021 400 mg titrated to 800 mg p.o. daily but her treatment was held between July 8 to March 05, 2021 secondary to fatigue and arthralgia.  She resumed her treatment at a dose of 400 mg p.o. daily for 3 days titrated to 600 mg p.o. daily but this was again held in mid August 2022.  She resumed her treatment on 04/23/2021 at 400 mg p.o. daily but this was discontinued in October 2022 secondary to disease progression was a scan on 05/30/2021 revealing progressive multifocal pleural-based disease. Now she is being considered for treatment with Avastin and Temodar but the patient asked with Dr. Angelina Ok to delay the treatment until early January 2023. She was referred to the clinic today to establish care with me in case she decided to proceed with the treatment locally close to home. The patient is feeling much better today with no concerning complaints. I recommended for the patient to start her treatment at [facility] first and if there is a lot of logistic issues I will be happy to do the treatment locally at home. She will call once she needs to come back to the cancer center in [place] for her treatment. She was advised to call immediately if she has any other concerning symptoms in the interval. The patient voices understanding of current disease status and treatment options and is in agreement with  the current care plan.  All questions were answered. The patient knows to call the clinic with any problems, questions or concerns. We can certainly see the patient much sooner if necessary.  The total time spent in the appointment was 35 minutes.  Disclaimer: This note was dictated with voice recognition software. Similar sounding words can inadvertently be transcribed and may not be corrected upon review.

## 2021-07-09 ENCOUNTER — Other Ambulatory Visit (HOSPITAL_BASED_OUTPATIENT_CLINIC_OR_DEPARTMENT_OTHER): Payer: Self-pay

## 2021-07-09 MED ORDER — PFIZER COVID-19 VAC BIVALENT 30 MCG/0.3ML IM SUSP
INTRAMUSCULAR | 0 refills | Status: DC
  Filled 2021-07-09: qty 0.3, 1d supply, fill #0

## 2021-09-21 ENCOUNTER — Telehealth: Payer: Self-pay

## 2021-09-21 NOTE — Telephone Encounter (Signed)
Call received from pt advising she was seen by Dr. Erling Cruz at [facility] yesterday and it was determined that her tumors have grown significantly. Pt requests to be scheduled with Dr. Julien Nordmann asap to discuss beginning tx.  I have made Dr. Julien Nordmann aware of this and he advised to scheduled the pt on his next available or with [facility], whomever has to first available appt.  I have advised the pt of this and she was agreeable to being scheduled with Cassandra H., PA-C on 10/01/21.

## 2021-09-30 NOTE — Progress Notes (Signed)
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  PRIOR THERAPY: 1) Status post right Pleurx catheter placement for drainage of recurrent right pleural effusion. 2) repeat biopsy on August 11, 2018 of the lung mass at [facility] and it showed atypical spindle cell proliferation. 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 4) restaging CT scan of the chest on September 14, 2019 showed new 1.5 cm soft tissue lesion along the right subpulmonic diaphragmatic pleural surface worrisome for tumor recurrence.  She was followed by observation by Dr. Angelina Ok at [place] [facility] 5) SBRT to the right lower lobe/diaphragmatic lesion completed June 22, 2020. 6) restaging scan on November 30, 2020 showed new right posterior pleural-based nodule followed by observation.  But repeat CT scan of the chest on 02/07/2021 showed enlarging right pleural mass/nodules. 7) the patient is started treatment with pazopanib on 02/17/2021 400 mg titrated to 800 mg p.o. daily but her treatment was held between July 8 to March 05, 2021 secondary to fatigue and arthralgia.  She resumed her treatment at a dose of 400 mg p.o. daily for 3 days titrated to 600 mg p.o. daily but this was again held in mid August 2022.  She resumed her treatment on 04/23/2021 at 400 mg p.o. daily but this was discontinued in October 2022 secondary to disease progression was a scan on 05/30/2021 revealing progressive multifocal pleural-based disease.  CURRENT THERAPY: Avastin and Temodar as recommended by Dr.  Angelina Ok at [place] [facility]. First dose of temodar expected on 10/08/21. Temodar days 1-7, and 15-21 p.o. every 4 weeks with IV avastin 5 mg/kg on days 8 and 22 every 4 weeks.  INTERVAL HISTORY: VERONNICA Patel 61 y.o. female returns to the clinic today for a follow-up visit. She is accompanied by her sister today. The patient was last seen by Dr. Julien Nordmann on 07/03/2021.  At that point in time, in October 2022, the patient was seen by Dr.Riedel at [place] [facility].  She had some evidence of progression at that time and was recommended that she start treatment with bevacizumab and Temodar.  The patient wished to delay treatment until after the holidays. She had a wonderful holiday.  The patient had a CT scan on 09/20/2021 that continued to show progressive multifocal pleural-based disease.  Dr. Angelina Ok recommended that the patient follow-up with our clinic as she is going to receive her treatment under our care which is closer to home. Dr. Angelina Ok also recommends that the patient have a Port-A-Cath placement as well as reimage every 2 cycles of treatment.  Overall, the patient is feeling fairly well today without any concerning complaints. She sometimes has chest tightness which she is unsure if that is related to anxiety. She reported some upper back pain but is not limiting her daily activities and states it has been better recently.  She denies any fever, chills, night sweats, or unexplained weight loss.  Denies any nausea, vomiting, diarrhea, or constipation.  Denies any shortness of breath, cough, or hemoptysis.  Denies any abnormal bleeding or bruising.  The patient is here today for evaluation and more  detailed discussion about the recommended treatment and to help facilitate making arrangements.   MEDICAL HISTORY: Past Medical History:  Diagnosis Date   Asthma    Cancer ([place])    Depression    Dyspnea    Pleural effusion on right    Pleural mass     ALLERGIES:  is allergic to  codeine and other.  MEDICATIONS:  Current Outpatient Medications  Medication Sig Dispense Refill   Calcium Citrate 333 MG TABS Take 333 mg by mouth daily.     [employer] 12 HOUR NASAL DECONGESTANT 120 MG 12 hr tablet Take 120 mg by mouth 2 (two) times daily.  10   fluticasone (FLONASE) 50 MCG/ACT nasal spray Place 1 spray into both nostrils 2 (two) times daily.  5   lidocaine-prilocaine (EMLA) cream Apply 1 application topically as needed. 30 g 2   meloxicam (MOBIC) 15 MG tablet Take 15 mg by mouth daily as needed for pain.      montelukast (SINGULAIR) 10 MG tablet Take 10 mg by mouth daily after supper.  10   ondansetron (ZOFRAN) 8 MG tablet Take 1 tablet (8 mg total) by mouth every 8 (eight) hours as needed for nausea or vomiting. 20 tablet 0   temozolomide (TEMODAR) 140 MG capsule Take 2 capsules (280 mg total) by mouth daily. May take on an empty stomach or at bedtime to decrease nausea & vomiting. 28 capsule 3   No current facility-administered medications for this visit.    SURGICAL HISTORY:  Past Surgical History:  Procedure Laterality Date   CHEST TUBE INSERTION Right 07/16/2018   Procedure: INSERTION PLEURAL DRAINAGE CATHETER, right;  Surgeon: Grace Isaac, MD;  Location: [place];  Service: Thoracic;  Laterality: Right;   COLONOSCOPY  2017   GUM SURGERY  1984   GRAFTS   IR THORACENTESIS ASP PLEURAL SPACE W/IMG GUIDE  07/03/2018    REVIEW OF SYSTEMS:   Review of Systems  Constitutional: Negative for appetite change, chills, fatigue, fever and unexpected weight change.  HENT: Negative for mouth sores, nosebleeds, sore throat and trouble swallowing.   Eyes: Negative for eye problems and icterus.  Respiratory: Negative for cough, hemoptysis, shortness of breath and wheezing.   Cardiovascular: Mild occasional chest tightness. Negative for leg swelling.  Gastrointestinal: Negative for abdominal pain, constipation, diarrhea, nausea and vomiting.  Genitourinary: Negative for bladder  incontinence, difficulty urinating, dysuria, frequency and hematuria.   Musculoskeletal: Negative for back pain, gait problem, neck pain and neck stiffness.  Skin: Negative for itching and rash.  Neurological: Negative for dizziness, extremity weakness, gait problem, headaches, light-headedness and seizures.  Hematological: Negative for adenopathy. Does not bruise/bleed easily.  Psychiatric/Behavioral: Negative for confusion, depression and sleep disturbance. The patient is not nervous/anxious.     PHYSICAL EXAMINATION:  Blood pressure (!) 159/90, pulse (!) 103, temperature (!) 97.5 F (36.4 C), temperature source Axillary, resp. rate 16, height 5\' 6"  (1.676 m), SpO2 98 %.  ECOG PERFORMANCE STATUS: 1  Physical Exam  Constitutional: Oriented to person, place, and time and well-developed, well-nourished, and in no distress.  HENT:  Head: Normocephalic and atraumatic.  Mouth/Throat: Oropharynx is clear and moist. No oropharyngeal exudate.  Eyes: Conjunctivae are normal. Right eye exhibits no discharge. Left eye exhibits no discharge. No scleral icterus.  Neck: Normal range of motion. Neck supple.  Cardiovascular: Normal rate, regular rhythm, normal heart sounds and intact distal pulses.   Pulmonary/Chest: Effort normal and breath sounds normal. No respiratory distress. No wheezes. No rales.  Abdominal: Soft. Bowel sounds are normal. Exhibits no distension and no mass. There is no tenderness.  Musculoskeletal: Normal range of motion. Exhibits no edema.  Lymphadenopathy:    No cervical adenopathy.  Neurological: Alert and oriented to person, place, and time. Exhibits normal muscle tone. Gait normal. Coordination normal.  Skin: Skin is warm and dry. No rash noted. Not diaphoretic. No erythema. No pallor.  Psychiatric: Mood, memory and judgment normal.  Vitals reviewed.  LABORATORY DATA: Lab Results  Component Value Date   WBC 12.9 (H) 10/21/2019   HGB 12.8 10/21/2019   HCT 39.6  10/21/2019   MCV 91.7 10/21/2019   PLT 256 10/21/2019      Chemistry      Component Value Date/Time   NA 140 10/21/2019 0336   K 3.8 10/21/2019 0336   CL 103 10/21/2019 0336   CO2 25 10/21/2019 0336   BUN 13 10/21/2019 0336   CREATININE 0.98 10/21/2019 0336   CREATININE 0.66 07/24/2018 0935      Component Value Date/Time   CALCIUM 9.3 10/21/2019 0336   ALKPHOS 88 10/21/2019 0336   AST 24 10/21/2019 0336   AST 26 07/24/2018 0935   ALT 17 10/21/2019 0336   ALT 16 07/24/2018 0935   BILITOT 0.7 10/21/2019 0336   BILITOT <0.2 (L) 07/24/2018 0935       RADIOGRAPHIC STUDIES:  No results found.   ASSESSMENT/PLAN:  This is a very pleasant 61 years old Caucasian female presented with large right lower lobe lung mass in addition to right pleural effusion.  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 The patient was referred to Dr. Angelina Ok at [place] [facility] for second opinion and she underwent several studies and intervention as listed below  1) repeat biopsy on August 11, 2018 of the lung mass at [facility] and it showed atypical spindle cell proliferation. 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 3) restaging CT scan of the chest on September 14, 2019 showed new 1.5 cm soft tissue lesion along the right subpulmonic diaphragmatic pleural surface worrisome for tumor recurrence.  She was followed by observation by Dr. Angelina Ok at [place] [facility] 4) SBRT to the right lower lobe/diaphragmatic lesion completed June 22, 2020. 5) restaging scan on November 30, 2020 showed new right posterior pleural-based nodule followed by observation.  But repeat CT scan of the chest on 02/07/2021 showed enlarging right pleural  mass/nodules. 6) the patient is started treatment with pazopanib on 02/17/2021 400 mg titrated to 800 mg p.o. daily but her treatment was held between July 8 to March 05, 2021 secondary to fatigue and arthralgia.  She resumed her treatment at a dose of 400 mg p.o. daily for 3 days titrated to 600 mg p.o. daily but this was again held in mid August 2022.  She resumed her treatment on 04/23/2021 at 400 mg p.o. daily but this was discontinued in October 2022 secondary to disease progression was a scan on 05/30/2021 revealing progressive multifocal pleural-based disease.. There was also a scan on 09/20/21 which showed progressive multifocal pleural based disease.   Dr. Angelina Ok recommends she consider treatment with Avastin and Temodar. She requested delaying starting treatment until after the holidays. She is now here to consider making arrangements for treatment.   She was referred  to the clinic today to establish care with Dr. Julien Nordmann as she decided to proceed with the treatment locally close to home.  Dr. Julien Nordmann discussed that the treatment would consist of Avastin 5 mg/kg milligrams on days 8 and 22 every 4 weeks with Temodar p.o on days 1-7 and 15-21 every 4 weeks.  We will arrange for the patient to meet with the oral chemotherapy pharmacist today for education and to facilitate obtaining temodar.  The patient is interested in a Port-A-Cath placement.  I have placed the order.  This is scheduled for tomorrow. I have sent EMLA cream to the pharmacy.   Dr. Angelina Ok recommends reimaging every 2 cycles of treatment.  The average side effects of avastin were discussed including but not limited to hypertension, abnormal bleeding and bruising, and GI perforation. She has the ability to monitor her BP at home. She previously was on lisinopril when she had HTN related to her prior treatment with pazopanib. She has not needed to take this since stopping pazopanib.   I have sent a prescription for zofran 8 mg p.o.  every 8 hours as needed for nausea to the patient's pharmacy. She was advised to take zofran 30 minutes before her temodar to avoid nausea/vomiting.   We will see her back for follow-up visit on 10/15/21. She is aiming to start temodar on 10/08/21 but knows to call us if she is unable to start that day as expected.   The patient was advised to call immediately if she has any concerning symptoms in the interval. The patient voices understanding of current disease status and treatment options and is in agreement with the current care plan. All questions were answered. The patient knows to call the clinic with any problems, questions or concerns. We can certainly see the patient much sooner if necessary   Orders Placed This Encounter  Procedures   IR IMAGING GUIDED PORT INSERTION    Standing Status:   Future    Standing Expiration Date:   10/01/2022    Order Specific Question:   Reason for Exam (SYMPTOM  OR DIAGNOSIS REQUIRED)    Answer:   starting chemotherapy. Please schedule earliest availability because we have to wait 7-10 days for chemo after port placement due to delayed wound healing from chemo    Order Specific Question:   Preferred Imaging Location?    Answer:   [facility]    Order Specific Question:   Is the patient pregnant?    Answer:   No   Total Protein, Urine dipstick    Standing Status:   Standing    Number of Occurrences:   20    Standing Expiration Date:   10/01/2022   CBC with Differential ([place] Only)    Standing Status:   Standing    Number of Occurrences:   20    Standing Expiration Date:   10/01/2022   CMP ([place] only)    Standing Status:   Standing    Number of Occurrences:   20    Standing Expiration Date:   10/01/2022      Tobe Sos Naureen Benton, PA-C 10/01/21  ADDENDUM: Hematology/Oncology Attending: I had a face-to-face encounter with the patient today.  I reviewed her record, lab and recommended her care plan.  This is a very  pleasant 61 years old white female diagnosed with malignant neoplasm with sarcomatoid features presented with large right lower lobe lung mass with recurrent right pleural effusion in December 2019.  The  patient is status post right Pleurx catheter placement with drainage of the recurrent right pleural effusion followed by repeat biopsy that was consistent with atypical spindle cell proliferation.  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  The patient had evidence for disease recurrence and she underwent treatment with Pacritinib discontinued secondary to disease progression.  She was seen recently by Dr. Angelina Ok at [place] [facility] who recommended treatment with Temodar and Avastin. I had a lengthy discussion with the patient today about her condition and the treatment regimen recommended by Dr. Angelina Ok.  She will be treated with Temodar 150 Mg/M2 on days 1-7 and days 15-21 in addition to Avastin 5 Mg/KG on days 8 and 22 on a 28-day cycle. We discussed with the patient the adverse effect of this treatment including nausea, vomiting, mild alopecia, myelosuppression, liver or renal dysfunction in addition to the adverse effects of the Avastin including wound healing delay, hypertension, proteinuria as well as bleeding issues including pulmonary hemorrhage as well as GI perforation. The patient is interested in proceeding with the treatment.  She will be seen later today by the pharmacist for oral oncolytic for teaching about Temodar and also to help her obtaining the medication. She is expected to start the first cycle of her treatment next week. We will see the patient back for follow-up visit on day 8 of her treatment for evaluation and management of any adverse effect of her treatment. The patient  was advised to call immediately if she has any other concerning symptoms in the interval. The total time spent in the appointment was 40 minutes. Disclaimer: This note was dictated with voice recognition software. Similar sounding words can inadvertently be transcribed and may be missed upon review. Eilleen Kempf, MD 10/01/21

## 2021-10-01 ENCOUNTER — Inpatient Hospital Stay: Payer: BC Managed Care – PPO | Attending: Internal Medicine | Admitting: Physician Assistant

## 2021-10-01 ENCOUNTER — Telehealth: Payer: Self-pay | Admitting: Pharmacist

## 2021-10-01 ENCOUNTER — Other Ambulatory Visit (HOSPITAL_COMMUNITY): Payer: Self-pay

## 2021-10-01 ENCOUNTER — Other Ambulatory Visit: Payer: Self-pay

## 2021-10-01 ENCOUNTER — Encounter: Payer: Self-pay | Admitting: Physician Assistant

## 2021-10-01 ENCOUNTER — Telehealth: Payer: Self-pay

## 2021-10-01 ENCOUNTER — Other Ambulatory Visit: Payer: Self-pay | Admitting: Internal Medicine

## 2021-10-01 VITALS — BP 159/90 | HR 103 | Temp 97.5°F | Resp 16 | Ht 66.0 in

## 2021-10-01 DIAGNOSIS — D492 Neoplasm of unspecified behavior of bone, soft tissue, and skin: Secondary | ICD-10-CM

## 2021-10-01 DIAGNOSIS — R5383 Other fatigue: Secondary | ICD-10-CM | POA: Diagnosis not present

## 2021-10-01 DIAGNOSIS — C3431 Malignant neoplasm of lower lobe, right bronchus or lung: Secondary | ICD-10-CM | POA: Diagnosis not present

## 2021-10-01 DIAGNOSIS — R0789 Other chest pain: Secondary | ICD-10-CM | POA: Diagnosis not present

## 2021-10-01 DIAGNOSIS — Z5111 Encounter for antineoplastic chemotherapy: Secondary | ICD-10-CM | POA: Insufficient documentation

## 2021-10-01 DIAGNOSIS — M549 Dorsalgia, unspecified: Secondary | ICD-10-CM | POA: Insufficient documentation

## 2021-10-01 DIAGNOSIS — Z79899 Other long term (current) drug therapy: Secondary | ICD-10-CM | POA: Insufficient documentation

## 2021-10-01 DIAGNOSIS — M2550 Pain in unspecified joint: Secondary | ICD-10-CM | POA: Diagnosis not present

## 2021-10-01 MED ORDER — LIDOCAINE-PRILOCAINE 2.5-2.5 % EX CREA: 1.0000 "application " | TOPICAL_CREAM | CUTANEOUS | 2 refills | Status: DC | PRN

## 2021-10-01 MED ORDER — TEMOZOLOMIDE 140 MG PO CAPS
150.0000 mg/m2/d | ORAL_CAPSULE | Freq: Every day | ORAL | 3 refills | Status: DC
  Filled 2021-10-01: qty 30, 15d supply, fill #0
  Filled 2021-10-02: qty 28, 28d supply, fill #0

## 2021-10-01 MED ORDER — ONDANSETRON HCL 8 MG PO TABS: 8.0000 mg | ORAL_TABLET | Freq: Three times a day (TID) | ORAL | 0 refills | Status: DC | PRN

## 2021-10-01 NOTE — Patient Instructions (Addendum)
They can schedule a port tomorrow at [facility]. NPO after midnight. Appointment at 9:30 AM for procedure at 11 AM. Please have a driver and dress comfortable.

## 2021-10-01 NOTE — Telephone Encounter (Signed)
Oral Oncology Pharmacist Encounter  Received new prescription for Temodar (temozolomide) for the treatment of metastatic solitary fibrous tumor in conjunction with bevacizumab, planned duration until disease progression or unacceptable drug toxicity.  CBC w/ Diff and CMP from 05/30/21 assessed, no relevant lab abnormalities noted. Prescription dose and frequency assessed for appropriateness. Appropriate for therapy initiation.   Current medication list in Epic reviewed, no relevant/significant DDIs with Temodar identified.  Evaluated chart and no patient barriers to medication adherence noted.   Patient agreement for treatment documented in MD note on 10/01/21.  Prescription has been e-scribed to the [facility] for benefits analysis and approval.  Oral Oncology Clinic will continue to follow for insurance authorization, copayment issues, initial counseling and start date.  Leron Croak, PharmD, BCPS Hematology/Oncology Clinical Pharmacist [facility] and [place] [phone] 10/01/2021 4:33 PM

## 2021-10-01 NOTE — Telephone Encounter (Signed)
Oral Oncology Patient Advocate Encounter   Received notification from [place] that prior authorization for Temodar is required.   PA submitted on CoverMyMeds Key [id number] Status is pending   Oral Oncology Clinic will continue to follow.   [place] Patient [place] Phone [phone] Fax [phone] 10/01/2021 4:22 PM

## 2021-10-01 NOTE — Telephone Encounter (Signed)
Oral Chemotherapy Pharmacist Encounter  I met  with patient for overview of: Temodar (temozolomide) for the treatment of metastatic solitary fibrous tumor in conjunction with bevacizumab, planned duration until disease progression or unacceptable drug toxicity.  Counseled on administration, dosing, side effects, monitoring, drug-food interactions, safe handling, storage, and disposal.  Patient will take Temodar 146m capsules, 2 capsules (2890mtotal daily dose), on days 1 to 7 and days 15 to 21 of a 28-day cycle. May take at bedtime and on an empty stomach to decrease nausea and vomiting.  Patient will take Zofran 2m79mablet, 1 tablet by mouth 30-60 min prior to Temodar dose to help decrease N/V.  Bevacizumab will be administered intravenously on days 8 and days 22 of each 28 day cycle.   Temodar start date: 10/08/21   Adverse effects include but are not limited to: nausea, vomiting, anorexia, GI upset, rash, drug fever, and fatigue. Rare but serious adverse effects of pneumocystis pneumonia and secondary malignancy also discussed.  We discussed strategies to manage constipation if they occur secondary to ondansetron dosing.  PCP prophylaxis will not be initiated at this time, but may be added based on lymphocyte count in the future.  Reviewed importance of keeping a medication schedule and plan for any missed doses. No barriers to medication adherence identified.  Medication reconciliation performed and medication/allergy list updated.  Insurance authorization for Temodar has been obtained.  All questions answered.  Ms. YouScherzingericed understanding and appreciation.   Medication education handout given to patient. Patient knows to call the office with questions or concerns. Oral Chemotherapy Clinic phone number provided to patient.   RebLeron CroakharmD, BCPS Hematology/Oncology Clinical Pharmacist [phone]/2023 4:34  PM

## 2021-10-02 ENCOUNTER — Encounter (HOSPITAL_COMMUNITY): Payer: Self-pay

## 2021-10-02 ENCOUNTER — Ambulatory Visit (HOSPITAL_COMMUNITY)
Admission: RE | Admit: 2021-10-02 | Discharge: 2021-10-02 | Disposition: A | Discharge status: Home / Self Care | Payer: BC Managed Care – PPO | Source: Ambulatory Visit | Attending: Physician Assistant | Admitting: Physician Assistant

## 2021-10-02 ENCOUNTER — Other Ambulatory Visit: Payer: Self-pay | Admitting: Physician Assistant

## 2021-10-02 ENCOUNTER — Other Ambulatory Visit: Payer: Self-pay

## 2021-10-02 ENCOUNTER — Other Ambulatory Visit (HOSPITAL_COMMUNITY): Payer: Self-pay

## 2021-10-02 DIAGNOSIS — R918 Other nonspecific abnormal finding of lung field: Secondary | ICD-10-CM | POA: Insufficient documentation

## 2021-10-02 DIAGNOSIS — C499 Malignant neoplasm of connective and soft tissue, unspecified: Secondary | ICD-10-CM | POA: Insufficient documentation

## 2021-10-02 DIAGNOSIS — D492 Neoplasm of unspecified behavior of bone, soft tissue, and skin: Secondary | ICD-10-CM

## 2021-10-02 HISTORY — PX: IR IMAGING GUIDED PORT INSERTION: IMG5740

## 2021-10-02 MED ORDER — TEMOZOLOMIDE 140 MG PO CAPS
150.0000 mg/m2/d | ORAL_CAPSULE | Freq: Every day | ORAL | 3 refills | Status: DC
  Filled 2021-10-02: qty 28, 14d supply, fill #0
  Filled 2021-10-22: qty 28, 28d supply, fill #1
  Filled 2021-11-20: qty 28, 28d supply, fill #2

## 2021-10-02 MED ORDER — FENTANYL CITRATE (PF) 100 MCG/2ML IJ SOLN
INTRAMUSCULAR | Status: AC | PRN
Start: 2021-10-02 — End: 2021-10-02
  Administered 2021-10-02 (×2): 25 ug via INTRAVENOUS

## 2021-10-02 MED ORDER — MIDAZOLAM HCL 2 MG/2ML IJ SOLN
INTRAMUSCULAR | Status: AC | PRN
  Administered 2021-10-02: .5 mg via INTRAVENOUS
  Administered 2021-10-02: 1 mg via INTRAVENOUS

## 2021-10-02 MED ORDER — HEPARIN SOD (PORK) LOCK FLUSH 100 UNIT/ML IV SOLN
INTRAVENOUS | Status: AC
  Filled 2021-10-02: qty 5

## 2021-10-02 MED ORDER — FENTANYL CITRATE (PF) 100 MCG/2ML IJ SOLN
INTRAMUSCULAR | Status: AC
  Filled 2021-10-02: qty 2

## 2021-10-02 MED ORDER — MIDAZOLAM HCL 2 MG/2ML IJ SOLN
INTRAMUSCULAR | Status: AC
  Filled 2021-10-02: qty 2

## 2021-10-02 MED ORDER — SODIUM CHLORIDE 0.9 % IV SOLN: INTRAVENOUS | Status: DC

## 2021-10-02 MED ORDER — LIDOCAINE-EPINEPHRINE 1 %-1:100000 IJ SOLN
INTRAMUSCULAR | Status: AC
  Filled 2021-10-02: qty 1

## 2021-10-02 NOTE — Addendum Note (Signed)
Addended byBritt Boozer on: 10/02/2021 04:32 PM   Modules accepted: Orders

## 2021-10-02 NOTE — H&P (Signed)
Chief Complaint: Patient was seen in consultation today for [name] a cath placement at the request of [place]  Referring Physician(s): Heilingoetter,Cassandra L  Supervising Physician: Michaelle Birks  Patient Status: [facility] - Out-pt  History of Present Illness: Brenda Patel is a 61 y.o. female   Malignant neoplasm with sarcomatoid features 2019 Follows with Dr Angelina Ok at [place] Progressive multifocal pleural based disease Recommendation of PAC placement Will be following closer to home with Dr Julien Nordmann at [place] IV chemotherapy to start Oct 15, 2021 per [pt  Scheduled today for [facility] placement    Past Medical History:  Diagnosis Date   Asthma    Cancer ([place])    Depression    Dyspnea    Pleural effusion on right    Pleural mass     Past Surgical History:  Procedure Laterality Date   CHEST TUBE INSERTION Right 07/16/2018   Procedure: INSERTION PLEURAL DRAINAGE CATHETER, right;  Surgeon: Grace Isaac, MD;  Location: [place];  Service: Thoracic;  Laterality: Right;   COLONOSCOPY  2017   GUM SURGERY  1984   GRAFTS   IR THORACENTESIS ASP PLEURAL SPACE W/IMG GUIDE  07/03/2018    Allergies: Codeine and Other  Medications: Prior to Admission medications   Medication Sig Start Date End Date Taking? Authorizing Provider  Calcium Citrate 333 MG TABS Take 333 mg by mouth daily.   Yes [provider]  [facility] 12 HOUR NASAL DECONGESTANT 120 MG 12 hr tablet Take 120 mg by mouth daily. 05/31/18  Yes [provider]  fluticasone (FLONASE) 50 MCG/ACT nasal spray Place 1 spray into both nostrils daily as needed for allergies. 05/05/18  Yes [provider]  meloxicam (MOBIC) 15 MG tablet Take 15 mg by mouth daily as needed for pain.  10/13/19  Yes [provider]  montelukast (SINGULAIR) 10 MG tablet Take 10 mg by mouth daily after supper. 06/07/18  Yes [provider]  lidocaine-prilocaine (EMLA) cream Apply  1 application topically as needed. Patient taking differently: Apply 1 application topically as needed [name]). 10/01/21   Heilingoetter, Cassandra L, PA-C  ondansetron (ZOFRAN) 8 MG tablet Take 1 tablet (8 mg total) by mouth every 8 (eight) hours as needed for nausea or vomiting. 10/01/21   Heilingoetter, Cassandra L, PA-C  temozolomide (TEMODAR) 140 MG capsule Take 2 capsules (280 mg total) by mouth daily. May take on an empty stomach or at bedtime to decrease nausea & vomiting. 10/01/21   Curt Bears, MD     History reviewed. No pertinent family history.  Social History   Socioeconomic History   Marital status: Divorced    Spouse name: Not on file   Number of children: Not on file   Years of education: Not on file   Highest education level: Not on file  Occupational History   Not on file  Tobacco Use   Smoking status: Never   Smokeless tobacco: Never  Vaping Use   Vaping Use: Never used  Substance and Sexual Activity   Alcohol use: Yes    Comment: 3-4 glasses of wine per month/liquor 1x monthly   Drug use: Never   Sexual activity: Not on file  Other Topics Concern   Not on file  Social History Narrative   Not on file   Social Determinants of Health   Financial Resource Strain: Not on file  Food Insecurity: Not on file  Transportation Needs: Not on file  Physical Activity: Not on file  Stress: Not on file  Social Connections: Not on file    Review of Systems: A 12 point ROS discussed and pertinent positives are indicated in the HPI above.  All other systems are negative.    Vital Signs: BP (!) 148/95    Pulse 94    Temp 98.2 F (36.8 C)    Resp 20    Ht 5\' 6"  (1.676 m)    Wt 157 lb (71.2 kg)    SpO2 97%    BMI 25.34 kg/m   Physical Exam Vitals reviewed.  HENT:     Mouth/Throat:     Mouth: Mucous membranes are moist.  Cardiovascular:     Rate and Rhythm: Normal rate and regular rhythm.     Heart sounds: Normal heart sounds.  Pulmonary:     Effort: Pulmonary  effort is normal.     Breath sounds: Normal breath sounds.  Abdominal:     Palpations: Abdomen is soft.  Musculoskeletal:        General: Normal range of motion.  Skin:    General: Skin is warm.  Neurological:     Mental Status: She is alert and oriented to person, place, and time.  Psychiatric:        Behavior: Behavior normal.    Imaging: No results found.  Labs:  CBC: No results for input(s): WBC, HGB, HCT, PLT in the last 8760 hours.  COAGS: No results for input(s): INR, APTT in the last 8760 hours.  BMP: No results for input(s): NA, K, CL, CO2, GLUCOSE, BUN, CALCIUM, CREATININE, GFRNONAA, GFRAA in the last 8760 hours.  Invalid input(s): CMP  LIVER FUNCTION TESTS: No results for input(s): BILITOT, AST, ALT, ALKPHOS, PROT, ALBUMIN in the last 8760 hours.  TUMOR MARKERS: No results for input(s): AFPTM, CEA, CA199, CHROMGRNA in the last 8760 hours.  Assessment and Plan:  Sarcoma- followed by Dr Caleen Jobs in [place] and Dr Julien Nordmann here To start IV chemo Feb 20 For port a cath placement today Risks and benefits of image guided port-a-catheter placement was discussed with the patient including, but not limited to bleeding, infection, pneumothorax, or fibrin sheath development and need for additional procedures.  All of the patient's questions were answered, patient is agreeable to proceed. Consent signed and in chart.   Thank you for this interesting consult.  I greatly enjoyed meeting Brenda Patel and look forward to participating in their care.  A copy of this report was sent to the requesting provider on this date.  Electronically Signed: Lavonia Drafts, PA-C 10/02/2021, 11:00 AM   I spent a total of  30 Minutes   in face to face in clinical consultation, greater than 50% of which was counseling/coordinating care for [name] placement

## 2021-10-02 NOTE — Procedures (Signed)
Vascular and Interventional Radiology Procedure Note  Patient: Brenda Patel DOB: November 21, 1960 Medical Record Number: [id number] Note Date/Time: 10/02/21 12:12 PM   Performing Physician: Michaelle Birks, MD Assistant(s): None  Diagnosis:  Sarcoma. R lung mass.  Procedure: PORT PLACEMENT  Anesthesia: Conscious Sedation Complications: None Estimated Blood Loss: Minimal  Findings:  Successful left-sided port placement, with the tip of the catheter in the proximal right atrium.  Plan: Catheter ready for use.  See detailed procedure note with images in PACS. The patient tolerated the procedure well without incident or complication and was returned to Recovery in stable condition.    Michaelle Birks, MD Vascular and Interventional Radiology Specialists [facility] Radiology   Pager. [phone]

## 2021-10-02 NOTE — Telephone Encounter (Signed)
Oral Oncology Patient Advocate Encounter  Prior Authorization for Temodar has been approved.    PA# [id number] Effective dates: 10/01/21 through 10/01/22  Patients co-pay is $100  Oral Oncology Clinic will continue to follow.   [place] Patient [place] Phone [phone] Fax [phone] 10/02/2021 8:25 AM

## 2021-10-03 ENCOUNTER — Other Ambulatory Visit (HOSPITAL_COMMUNITY): Payer: Self-pay

## 2021-10-03 ENCOUNTER — Telehealth: Payer: Self-pay | Admitting: Internal Medicine

## 2021-10-03 NOTE — Progress Notes (Signed)
Patient walked into the cancer center wanting someone to look at her port incision site. Patient had a port placed yesterday 10/02/21 and was complaining of itching and rash from the bandage that was placed over it. She had taken off the bandage and put her own over the incision. I cleaned the port area and placed sterile gauze and sensitive dressing over the area. I explained to the patient to leave that on for at least another day, and that she could use hydrocortisone cream for the small rash from the dressing as long as she does not get it on the incision. I also informed the patient to call us or go to urgent care if there are any signs of infection. Patient verbalized understanding and thanks.

## 2021-10-03 NOTE — Telephone Encounter (Signed)
Scheduled per 02/06 los, patient has received updated calender.

## 2021-10-04 ENCOUNTER — Other Ambulatory Visit (HOSPITAL_COMMUNITY): Payer: Self-pay

## 2021-10-05 ENCOUNTER — Other Ambulatory Visit (HOSPITAL_COMMUNITY): Payer: Self-pay | Admitting: Internal Medicine

## 2021-10-05 DIAGNOSIS — Z95828 Presence of other vascular implants and grafts: Secondary | ICD-10-CM

## 2021-10-05 NOTE — Progress Notes (Signed)
Pt called and states that she has redness around port that was palced 3 days ago and believes that it is reaction to bandage. Returned call and left VM for pt to remove bandage, clean with soap and water and keep clean and dry. She was advised not to apply creams/ ointments as it might dissolve sutures. She was also advised to notify if she has swelling to area, fever, chills or other unusual symptoms. She has f/u port site check on 2/13 at [facility].     Narda Rutherford, AGNP-BC 10/05/2021, 4:22 PM

## 2021-10-06 ENCOUNTER — Other Ambulatory Visit (HOSPITAL_COMMUNITY): Payer: Self-pay | Admitting: Physician Assistant

## 2021-10-06 ENCOUNTER — Telehealth (HOSPITAL_COMMUNITY): Payer: Self-pay | Admitting: Physician Assistant

## 2021-10-06 MED ORDER — DOXYCYCLINE MONOHYDRATE 100 MG PO TABS: 100.0000 mg | ORAL_TABLET | Freq: Two times a day (BID) | ORAL | 0 refills | Status: AC

## 2021-10-06 NOTE — Progress Notes (Signed)
Patient ID: Brenda Patel, female   DOB: 05-09-1961, 61 y.o.   MRN: [id number]  Brief Progress Note  [name] called concerned about redness, pain and itching at site of port insertion, placed 10/02/21.  She says symptoms have been increasing over the past several days.  She describes a small amount of serous type fluid oozing from incision site.  She has tried PO benadryl, topical cortisone cream, tylenol, ice, changing of bandages/adhesives, all without relief.  She denies and N/V or fever.    Encouraged use of ice, benadryl, and ibuprofen prn.  Sent in Rx of Doxycycline 100mg  BID x 7 days to preferred pharmacy, treating empirically for skin infection.  She has a scheduled a 12:30p appointment for a site check with [place] on Monday 2/13.  She understands if symptoms persist or worsen she can present to the ED or call back with concerns.     Electronically Signed: Pasty Spillers, PA 10/06/2021, 11:03 AM

## 2021-10-08 ENCOUNTER — Ambulatory Visit (HOSPITAL_COMMUNITY)
Admission: RE | Admit: 2021-10-08 | Discharge: 2021-10-08 | Disposition: A | Discharge status: Home / Self Care | Payer: BC Managed Care – PPO | Source: Ambulatory Visit | Attending: Internal Medicine | Admitting: Internal Medicine

## 2021-10-08 ENCOUNTER — Other Ambulatory Visit (HOSPITAL_COMMUNITY): Payer: Self-pay | Admitting: Internal Medicine

## 2021-10-08 ENCOUNTER — Other Ambulatory Visit: Payer: Self-pay

## 2021-10-08 ENCOUNTER — Other Ambulatory Visit (HOSPITAL_COMMUNITY): Payer: Self-pay | Admitting: Radiology

## 2021-10-08 DIAGNOSIS — Z95828 Presence of other vascular implants and grafts: Secondary | ICD-10-CM

## 2021-10-08 HISTORY — PX: IR RADIOLOGIST EVAL & MGMT: IMG5224

## 2021-10-08 NOTE — Progress Notes (Signed)
Pharmacist Chemotherapy Monitoring - Initial Assessment    Anticipated start date: 10/15/21   The following has been reviewed per standard work regarding the patient's treatment regimen: The patient's diagnosis, treatment plan and drug doses, and organ/hematologic function Lab orders and baseline tests specific to treatment regimen  The treatment plan start date, drug sequencing, and pre-medications Prior authorization status  Patient's documented medication list, including drug-drug interaction screen and prescriptions for anti-emetics and supportive care specific to the treatment regimen The drug concentrations, fluid compatibility, administration routes, and timing of the medications to be used The patient's access for treatment and lifetime cumulative dose history, if applicable  The patient's medication allergies and previous infusion related reactions, if applicable   Changes made to treatment plan:  N/A  Follow up needed:  Pending authorization for treatment   Benn Moulder, PharmD Pharmacy Resident  10/08/2021 8:59 AM

## 2021-10-08 NOTE — Procedures (Signed)
Patient seen today in IR department to evaluate left chest wall Port-A-Cath site.  Please refer to fluency dictation for details.

## 2021-10-10 ENCOUNTER — Inpatient Hospital Stay: Payer: BC Managed Care – PPO

## 2021-10-10 ENCOUNTER — Other Ambulatory Visit: Payer: Self-pay

## 2021-10-11 ENCOUNTER — Ambulatory Visit (HOSPITAL_COMMUNITY)
Admission: RE | Admit: 2021-10-11 | Discharge: 2021-10-11 | Disposition: A | Discharge status: Home / Self Care | Payer: BC Managed Care – PPO | Source: Ambulatory Visit | Attending: Radiology | Admitting: Radiology

## 2021-10-11 ENCOUNTER — Other Ambulatory Visit (HOSPITAL_COMMUNITY): Payer: Self-pay | Admitting: Radiology

## 2021-10-11 DIAGNOSIS — Z95828 Presence of other vascular implants and grafts: Secondary | ICD-10-CM

## 2021-10-11 HISTORY — PX: IR RADIOLOGIST EVAL & MGMT: IMG5224

## 2021-10-11 NOTE — Progress Notes (Signed)
Patient ID: Brenda Patel, female   DOB: 10/28/1960, 61 y.o.   MRN: [id number] Pt seen in IR department again today for follow-up evaluation of left chest wall Port-A-Cath site.  Please see fluency for details of exam.

## 2021-10-15 ENCOUNTER — Other Ambulatory Visit: Payer: Self-pay

## 2021-10-15 ENCOUNTER — Inpatient Hospital Stay: Payer: BC Managed Care – PPO

## 2021-10-15 ENCOUNTER — Other Ambulatory Visit (HOSPITAL_COMMUNITY): Payer: Self-pay | Admitting: Radiology

## 2021-10-15 ENCOUNTER — Telehealth: Payer: Self-pay | Admitting: Medical Oncology

## 2021-10-15 ENCOUNTER — Ambulatory Visit (HOSPITAL_COMMUNITY)
Admission: RE | Admit: 2021-10-15 | Discharge: 2021-10-15 | Disposition: A | Discharge status: Home / Self Care | Payer: BC Managed Care – PPO | Source: Ambulatory Visit | Attending: Radiology | Admitting: Radiology

## 2021-10-15 ENCOUNTER — Inpatient Hospital Stay: Payer: BC Managed Care – PPO | Admitting: Internal Medicine

## 2021-10-15 DIAGNOSIS — T7840XA Allergy, unspecified, initial encounter: Secondary | ICD-10-CM | POA: Diagnosis not present

## 2021-10-15 DIAGNOSIS — Z452 Encounter for adjustment and management of vascular access device: Secondary | ICD-10-CM | POA: Insufficient documentation

## 2021-10-15 DIAGNOSIS — Z95828 Presence of other vascular implants and grafts: Secondary | ICD-10-CM

## 2021-10-15 DIAGNOSIS — Y848 Other medical procedures as the cause of abnormal reaction of the patient, or of later complication, without mention of misadventure at the time of the procedure: Secondary | ICD-10-CM | POA: Insufficient documentation

## 2021-10-15 HISTORY — PX: IR REMOVAL TUN ACCESS W/ PORT W/O FL MOD SED: IMG2290

## 2021-10-15 MED ORDER — LIDOCAINE-EPINEPHRINE 1 %-1:100000 IJ SOLN
INTRAMUSCULAR | Status: AC
  Filled 2021-10-15: qty 1

## 2021-10-15 NOTE — Telephone Encounter (Signed)
Avastin cancelled today due to port a cath removal.  Pt instructed to start temodar 2/28-03/06 and keep appt for Avastin on 03/07. Pt voices understanding.

## 2021-10-15 NOTE — Procedures (Signed)
Interventional Radiology Procedure Note  Procedure: Port removal Indication: Allergic reaction to port  Findings: Please refer to procedural dictation for full description.  Complications: None  EBL: < 10 mL  Miachel Roux, MD [phone]

## 2021-10-22 ENCOUNTER — Other Ambulatory Visit (HOSPITAL_COMMUNITY): Payer: Self-pay

## 2021-10-23 ENCOUNTER — Other Ambulatory Visit (HOSPITAL_COMMUNITY): Payer: Self-pay

## 2021-10-26 NOTE — Progress Notes (Signed)
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  PRIOR THERAPY: 1) Status post right Pleurx catheter placement for drainage of recurrent right pleural effusion. 2) repeat biopsy on August 11, 2018 of the lung mass at [facility] and it showed atypical spindle cell proliferation. 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 4) restaging CT scan of the chest on September 14, 2019 showed new 1.5 cm soft tissue lesion along the right subpulmonic diaphragmatic pleural surface worrisome for tumor recurrence.  She was followed by observation by Dr. Angelina Ok at [place] [facility] 5) SBRT to the right lower lobe/diaphragmatic lesion completed June 22, 2020. 6) restaging scan on November 30, 2020 showed new right posterior pleural-based nodule followed by observation.  But repeat CT scan of the chest on 02/07/2021 showed enlarging right pleural mass/nodules. 7) the patient is started treatment with pazopanib on 02/17/2021 400 mg titrated to 800 mg p.o. daily but her treatment was held between July 8 to March 05, 2021 secondary to fatigue and arthralgia.  She resumed her treatment at a dose of 400 mg p.o. daily for 3 days titrated to 600 mg p.o. daily but this was again held in mid August 2022.  She resumed her treatment on 04/23/2021 at 400 mg p.o. daily but this was discontinued in October 2022 secondary to disease progression was a scan on 05/30/2021 revealing progressive multifocal pleural-based disease.  CURRENT THERAPY: Avastin and Temodar as  recommended by Dr. Angelina Ok at [place] [facility]. First dose of temodar expected on 10/30/21. Temodar days 1-7, and 15-21 p.o. every 4 weeks with IV avastin 5 mg/kg on days 8 and 22 every 4 weeks.   INTERVAL HISTORY: Brenda Patel 61 y.o. female returns to the clinic today for a follow-up visit. She is accompanied by her sister today. The patient was last seen by Dr. Julien Nordmann on 10/01/21.   At that point in time, in October 2022, the patient was seen by Dr. Angelina Ok at [place] [facility].  She had some evidence of progression at that time and was recommended that she start treatment with bevacizumab and Temodar.  The patient wished to delay treatment until after the holidays. She had a wonderful holiday and a nice vacation.  The patient had a CT scan on 09/20/2021 that continued to show progressive multifocal pleural-based disease.  Dr. Angelina Ok recommended that the patient follow-up with our clinic as she is going to receive her treatment under our care which is closer to home. Dr. Angelina Ok also recommends that the patient have a Port-A-Cath placement as well as reimage every 2 cycles of treatment.  The patient had a port-a-cath placed on 10/02/21. She had severe redness and itching at the site and the port-a-cath was ultimately removed on 10/15/21. She had a significant allergy to the port-a-cath and to the dermabond. Her treatment was delayed until this area can heal. It has healed well overall. There is only a tiny area of oozing.   She is here to discuss starting her first treatment with the new treatment. She took her days 1-7 of temodar. She tolerated it without  any appreciable adverse side effects. Overall, the patient is feeling fairly well today without any concerning complaints.  She denies any fever, chills, night sweats, or unexplained weight loss. Denies any nausea, vomiting, or diarrhea. She has mild constipation and will take milk of magnesia if needed. Denies any shortness of breath, cough, or  hemoptysis.  Denies any abnormal bleeding or bruising.  The patient is here today for evaluation before undergoing her first cycle of avastin.     MEDICAL HISTORY: Past Medical History:  Diagnosis Date   Asthma    Cancer ([place])    Depression    Dyspnea    Pleural effusion on right    Pleural mass     ALLERGIES:  is allergic to codeine and other.  MEDICATIONS:  Current Outpatient Medications  Medication Sig Dispense Refill   Calcium Citrate 333 MG TABS Take 333 mg by mouth daily.     [employer] 12 HOUR NASAL DECONGESTANT 120 MG 12 hr tablet Take 120 mg by mouth daily.  10   fluticasone (FLONASE) 50 MCG/ACT nasal spray Place 1 spray into both nostrils daily as needed for allergies.  5   lidocaine-prilocaine (EMLA) cream Apply 1 application topically as needed. (Patient taking differently: Apply 1 application topically as needed [facility]).) 30 g 2   meloxicam (MOBIC) 15 MG tablet Take 15 mg by mouth daily as needed for pain.      montelukast (SINGULAIR) 10 MG tablet Take 10 mg by mouth daily after supper.  10   ondansetron (ZOFRAN) 8 MG tablet Take 1 tablet (8 mg total) by mouth every 8 (eight) hours as needed for nausea or vomiting. 20 tablet 0   temozolomide (TEMODAR) 140 MG capsule Take 2 capsules (280 mg total) by mouth daily. Take on days 1-7 and days 15-21 of each 28 day cycle. May take on an empty stomach or at bedtime to decrease nausea & vomiting. 28 capsule 3   No current facility-administered medications for this visit.    SURGICAL HISTORY:  Past Surgical History:  Procedure Laterality Date   CHEST TUBE INSERTION Right 07/16/2018   Procedure: INSERTION PLEURAL DRAINAGE CATHETER, right;  Surgeon: Grace Isaac, MD;  Location: [facility];  Service: Thoracic;  Laterality: Right;   COLONOSCOPY  2017   GUM SURGERY  1984   GRAFTS   IR IMAGING GUIDED PORT INSERTION  10/02/2021   IR RADIOLOGIST EVAL & MGMT  10/08/2021   IR RADIOLOGIST EVAL & MGMT  10/11/2021   IR REMOVAL TUN ACCESS W/  PORT W/O FL MOD SED  10/15/2021   IR THORACENTESIS ASP PLEURAL SPACE W/IMG GUIDE  07/03/2018    REVIEW OF SYSTEMS:   Review of Systems  Constitutional: Negative for appetite change, chills, fatigue, fever and unexpected weight change.  HENT: Negative for mouth sores, nosebleeds, sore throat and trouble swallowing.   Eyes: Negative for eye problems and icterus.  Respiratory: Negative for cough, hemoptysis, shortness of breath and wheezing.   Cardiovascular: Negative for chest pain and leg swelling.  Gastrointestinal: Negative for abdominal pain, constipation, diarrhea, nausea and vomiting.  Genitourinary: Negative for bladder incontinence, difficulty urinating, dysuria, frequency and hematuria.   Musculoskeletal: Negative for back pain, gait problem, neck pain and neck stiffness.  Skin: Improving erythema over port-a-cath site.  Neurological: Negative for dizziness, extremity weakness, gait problem, headaches, light-headedness and seizures.  Hematological: Negative for adenopathy. Does not bruise/bleed easily.  Psychiatric/Behavioral: Negative for confusion, depression and sleep disturbance. The patient is not nervous/anxious.  PHYSICAL EXAMINATION:  Blood pressure (!) 143/79, pulse 92, resp. rate 17, weight 162 lb (73.5 kg).  ECOG PERFORMANCE STATUS: 1  Physical Exam  Constitutional: Oriented to person, place, and time and thin appearing female and in no distress.  HENT:  Head: Normocephalic and atraumatic.  Mouth/Throat: Oropharynx is clear and moist. No oropharyngeal exudate.  Eyes: Conjunctivae are normal. Right eye exhibits no discharge. Left eye exhibits no discharge. No scleral icterus.  Neck: Normal range of motion. Neck supple.  Cardiovascular: Normal rate, regular rhythm, normal heart sounds and intact distal pulses.   Pulmonary/Chest: Effort normal. Quiet breath sounds in right lung. No respiratory distress. No wheezes. No rales.  Abdominal: Soft. Bowel sounds are normal.  Exhibits no distension and no mass. There is no tenderness.  Musculoskeletal: Normal range of motion. Exhibits no edema.  Lymphadenopathy:    No cervical adenopathy.  Neurological: Alert and oriented to person, place, and time. Exhibits normal muscle tone. Gait normal. Coordination normal.  Skin: Improving erythema over port-a-cath site. Skin is warm and dry. Not diaphoretic. No pallor.  Psychiatric: Mood, memory and judgment normal.  Vitals reviewed.  LABORATORY DATA: Lab Results  Component Value Date   WBC 4.1 10/30/2021   HGB 12.1 10/30/2021   HCT 36.3 10/30/2021   MCV 88.5 10/30/2021   PLT 143 (L) 10/30/2021      Chemistry      Component Value Date/Time   NA 140 10/30/2021 0803   K 4.1 10/30/2021 0803   CL 106 10/30/2021 0803   CO2 27 10/30/2021 0803   BUN 13 10/30/2021 0803   CREATININE 0.71 10/30/2021 0803      Component Value Date/Time   CALCIUM 9.2 10/30/2021 0803   ALKPHOS 92 10/30/2021 0803   AST 28 10/30/2021 0803   ALT 20 10/30/2021 0803   BILITOT 0.5 10/30/2021 0803       RADIOGRAPHIC STUDIES:  IR REMOVAL TUN ACCESS W/ PORT W/O FL MOD SED  Result Date: 10/15/2021 INDICATION: 61 year old woman with history of pulmonary sarcoma status post right chest port placement by Dr. Maryelizabeth Kaufmann on 10/02/2021 returns with larger area of rash overlying the port insertion site, venotomy site, and medial left chest. The rash has progressively worsened over the weekend. Decision has been made to remove the port. EXAM: Removal of left IJ venous port MEDICATIONS: None ANESTHESIA/SEDATION: None COMPLICATIONS: None immediate. PROCEDURE: Informed written consent was obtained from the patient after a thorough discussion of the procedural risks, benefits and alternatives. All questions were addressed. Maximal Sterile Barrier Technique was utilized including caps, mask, sterile gowns, sterile gloves, sterile drape, hand hygiene and skin antiseptic. A timeout was performed prior to the  initiation of the procedure. A timeout was performed prior to the initiation of the procedure. Left anterior upper chest prepped and draped in the usual sterile fashion. Following local lidocaine administration, an incision was made at the inferior margin of the port pocket. The pocket was dissected, and the port and catheter were removed in their entirety. The incision was closed with deep and superficial absorbable suture. No Dermabond was utilized as the patient stated that she had a sensitivity to the material. IMPRESSION: Successful left IJ vein Port-A-Cath explant. Electronically Signed   By: Miachel Roux M.D.   On: 10/15/2021 13:08   IR IMAGING GUIDED PORT INSERTION  Result Date: 10/02/2021 INDICATION: Sarcoma with RIGHT lung mass. EXAM: IMPLANTED PORT A CATH PLACEMENT WITH ULTRASOUND AND FLUOROSCOPIC GUIDANCE MEDICATIONS: None ANESTHESIA/SEDATION: Moderate (conscious) sedation was employed during this  procedure. A total of Versed 1.5 mg and Fentanyl 50 mcg was administered intravenously. Moderate Sedation Time: 22 minutes. The patient's level of consciousness and vital signs were monitored continuously by radiology nursing throughout the procedure under my direct supervision. FLUOROSCOPY TIME:  0 minutes, 6 seconds (0 mGy) COMPLICATIONS: None immediate. PROCEDURE: The procedure, risks, benefits, and alternatives were explained to the patient. Questions regarding the procedure were encouraged and answered. The patient understands and consents to the procedure. The LEFT neck and chest were prepped with chlorhexidine in a sterile fashion, and a sterile drape was applied covering the operative field. Maximum barrier sterile technique with sterile gowns and gloves were used for the procedure. A timeout was performed prior to the initiation of the procedure. Local anesthesia was provided with 1% lidocaine with epinephrine. After creating a small venotomy incision, a micropuncture kit was utilized to access the  internal jugular vein under direct, real-time ultrasound guidance. Ultrasound image documentation was performed. The microwire was kinked to measure appropriate catheter length. A subcutaneous port pocket was then created along the upper chest wall utilizing a combination of sharp and blunt dissection. The pocket was irrigated with sterile saline. A single lumen ISP power injectable port was chosen for placement. The 8 Fr catheter was tunneled from the port pocket site to the venotomy incision. The port was placed in the pocket. The external catheter was trimmed to appropriate length. At the venotomy, an 8 Fr peel-away sheath was placed over a guidewire under fluoroscopic guidance. The catheter was then placed through the sheath and the sheath was removed. Final catheter positioning was confirmed and documented with a fluoroscopic spot radiograph. The port was accessed with a Huber needle, aspirated and flushed with heparinized saline. The port pocket incision was closed with interrupted 3-0 Vicryl suture then Dermabond was applied, including at the venotomy incision. Dressings were placed. The patient tolerated the procedure well without immediate post procedural complication. IMPRESSION: Successful placement of a LEFT internal jugular approach power injectable Port-A-Cath. The tip of the catheter is positioned within the superior cavoatrial junction. The catheter is ready for immediate use. Michaelle Birks, MD [facility] Radiology Electronically Signed   By: Michaelle Birks M.D.   On: 10/02/2021 13:21   IR Radiologist Eval & Mgmt  Result Date: 10/11/2021 EXAM: ESTABLISHED PATIENT OFFICE VISIT CHIEF COMPLAINT: Redness and itching at left chest wall Port-A-Cath site HISTORY OF PRESENT ILLNESS: Brenda Patel is a 61 year old female with history of large right lower lobe lung mass consistent with malignant neoplasm with sarcomatoid features. She underwent left chest wall  Port-A-Cath placement on 10/02/2021 at [facility]. Within 24-48 hours patient began to notice some erythema and itching at port site. This progressed over several days despite use of oral Benadryl, topical cortisone cream, Tylenol, ice application and changing/removal of bandages. She denied fever or pain at the site. She contacted the IR department on 02/11 and was encouraged to use ice, Benadryl and ibuprofen as needed. Prescription for doxycycline 100 mg b.i.d. for 7 days was electronically sent to patient's pharmacy. She presented to IR department again on 02/13 for further evaluation. She denied fever or drainage from port site. Patient was seen by Dr. Serafina Royals and prescribed regimen of Medrol Dosepak. Doxycycline was discontinued. She presents again today for follow-up exam. She continues to deny fever or significant drainage a port site. She does however continue to have itching and erythema in region although lighter in intensity since prior exam. No other  significant medical complaints. REVIEW OF SYSTEMS: Denies fever/chills, port site drainage, respiratory difficulties, chest pain, dyspnea, nausea/vomiting or bleeding; does have some itching and mild warmth of skin at port area PHYSICAL EXAMINATION: Patient is awake, alert. Left chest wall Port-A-Cath site with continued erythema but not as intense as on previous exam. Area of erythema extends from IJ puncture site to left breast region and to portions of upper right chest wall. Sites are nontender to palpation, however area does itch and is mildly warm to touch. ASSESSMENT AND PLAN: 61 year old female with history of right lung neoplasm with sarcomatoid features, status post left chest wall Port-A-Cath placement on 10/02/2021. Now presents with erythema and itching at port and IJ access sites. No definite purulent drainage from either site or reported fevers. Patient was examined again by Dr. Serafina Royals. Skin findings again concerning for possible  allergic reaction/immune mediated atopic dermatitis. Some decrease in intensity of erythema noted with Medrol Dosepak. Patient continues to deny pain at port site. Plan at this time is for patient to finish Medrol Dosepak, and utilize topical Benadryl and/or hydrocortisone ointment to skin site along with oral Benadryl and Advil as needed. Patient will return to IR department on 10/15/21 for re-evaluation. If condition worsens will likely require port removal. Plan also discussed with patient's oncologist Dr. Julien Nordmann today. Read by: Rowe Robert, PA-C Electronically Signed   By: Ruthann Cancer M.D.   On: 10/11/2021 14:50   IR Radiologist Eval & Mgmt  Result Date: 10/08/2021 EXAM: ESTABLISHED PATIENT OFFICE VISIT CHIEF COMPLAINT: Redness and itching at left chest wall Port-A-Cath site HISTORY OF PRESENT ILLNESS: Brenda Patel is a 61 year old female with history of large right lower lobe lung mass consistent with malignant neoplasm with sarcomatoid features. She underwent left chest wall Port-A-Cath placement on 10/02/2021 at [facility]. Within 24-48 hours patient began to notice some erythema and itching at port site. This progressed over several days despite use of oral Benadryl, topical cortisone cream, Tylenol, ice application, and changing/removal of bandages. She denied fever or pain at the site. She contacted the IR department on 10/06/21 and was encouraged to use ice, Benadryl and ibuprofen as needed. Prescription for doxycycline 100 mg b.i.d. for 7 days was electronically sent to patient's pharmacy. She now presents for follow-up evaluation of port site due to persistent and worsening erythema/itching. REVIEW OF SYSTEMS: As above, denies fever, chills, pain at port site, respiratory difficulties, nausea, vomiting or bleeding. There has also been no significant purulent drainage from the port or IJ access sites. PHYSICAL EXAMINATION: Patient is awake, alert. Left chest wall Port-A-Cath site with  significant erythema which extends from IJ puncture site to left breast region. Unable to express any purulent drainage from IJ access or port pocket site. Sites are nontender to palpation, however area does itch. ASSESSMENT AND PLAN: 61 year old female with history of right lung neoplasm with sarcomatoid features, status post left chest wall Port-A-Cath placement on 10/02/2021. Now presents with increasing erythema and itching at port and IJ access sites. No definite purulent drainage from either site or reported fevers. Patient was examined by Dr. Serafina Royals. Skin findings are concerning for possible allergic reaction/immune mediated atopic dermatitis. Case discussed with Dr. Julien Nordmann (pt's oncologist) and plan at this time is to administer a Medrol Dosepak and follow up with patient on 02/16 for site recheck. Patient was told to discontinue doxycycline. Read by: Rowe Robert, PA-C Electronically Signed   By: Ruthann Cancer M.D.   On: 10/08/2021 14:43  ASSESSMENT/PLAN:  This is a very pleasant 61 years old Caucasian female presented with large right lower lobe lung mass in addition to right pleural effusion.  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 The patient was referred to Dr. Angelina Ok at [place] [facility] for second opinion and she underwent several studies and intervention as listed below   1) repeat biopsy on August 11, 2018 of the lung mass at [facility] and it showed atypical spindle cell proliferation. 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 3) restaging CT scan of the chest on September 14, 2019 showed new 1.5 cm soft tissue lesion along the right subpulmonic diaphragmatic pleural surface worrisome for tumor recurrence.  She was  followed by observation by Dr. Angelina Ok at [place] [facility] 4) SBRT to the right lower lobe/diaphragmatic lesion completed June 22, 2020. 5) restaging scan on November 30, 2020 showed new right posterior pleural-based nodule followed by observation.  But repeat CT scan of the chest on 02/07/2021 showed enlarging right pleural mass/nodules. 6) the patient is started treatment with pazopanib on 02/17/2021 400 mg titrated to 800 mg p.o. daily but her treatment was held between July 8 to March 05, 2021 secondary to fatigue and arthralgia.  She resumed her treatment at a dose of 400 mg p.o. daily for 3 days titrated to 600 mg p.o. daily but this was again held in mid August 2022.  She resumed her treatment on 04/23/2021 at 400 mg p.o. daily but this was discontinued in October 2022 secondary to disease progression was a scan on 05/30/2021 revealing progressive multifocal pleural-based disease.. There was also a scan on 09/20/21 which showed progressive multifocal pleural based disease.     Dr. Angelina Ok recommended she consider treatment with Avastin and Temodar. She requested delaying starting treatment until after the holidays. She is now here to undergo her first cycle of treatment. She is on Avastin 5 mg/kg milligrams on days 8 and 22 every 4 weeks with Temodar p.o on days 1-7 and 15-21 every 4 weeks.  There was some delay in starting her treatment due to insertion and removal of her Port-A-Cath due to a significant allergy.    Labs reviewed.  Recommend that she proceed with cycle #1 today of avastin as scheduled.  We will see her back for follow-up visit in 3 weeks for evaluation before starting cycle #2.  Dr. Angelina Ok recommends reimaging every 2 cycles of treatment.  Advised to check her blood pressure at home periodically. She used to be on lisinopril but it was discontinued.   She asked if it is ok to get a massage with her condition. Discussed that should be fine from our standpoint.   The  patient was advised to call immediately if she has any concerning symptoms in the interval. The patient voices understanding of current disease status and treatment options and is in agreement with the current care plan. All questions were answered. The patient knows to call the clinic with any problems, questions or concerns. We can certainly see the patient much sooner if necessary           No orders of the defined types were placed in this encounter.    The total time spent in the appointment was 20-29 minutes.   Laramie Gelles L  Youssef Footman, PA-C 10/30/21

## 2021-10-29 ENCOUNTER — Ambulatory Visit: Payer: BC Managed Care – PPO

## 2021-10-29 ENCOUNTER — Ambulatory Visit: Payer: BC Managed Care – PPO | Admitting: Physician Assistant

## 2021-10-29 ENCOUNTER — Other Ambulatory Visit: Payer: BC Managed Care – PPO

## 2021-10-30 ENCOUNTER — Other Ambulatory Visit: Payer: Self-pay

## 2021-10-30 ENCOUNTER — Inpatient Hospital Stay: Payer: BC Managed Care – PPO

## 2021-10-30 ENCOUNTER — Ambulatory Visit: Payer: BC Managed Care – PPO

## 2021-10-30 ENCOUNTER — Inpatient Hospital Stay: Payer: BC Managed Care – PPO | Attending: Internal Medicine | Admitting: Physician Assistant

## 2021-10-30 VITALS — BP 137/77 | Temp 98.6°F

## 2021-10-30 DIAGNOSIS — Z79630 Long term (current) use of alkylating agent: Secondary | ICD-10-CM | POA: Diagnosis not present

## 2021-10-30 DIAGNOSIS — C3491 Malignant neoplasm of unspecified part of right bronchus or lung: Secondary | ICD-10-CM | POA: Diagnosis not present

## 2021-10-30 DIAGNOSIS — R21 Rash and other nonspecific skin eruption: Secondary | ICD-10-CM | POA: Diagnosis not present

## 2021-10-30 DIAGNOSIS — Z5112 Encounter for antineoplastic immunotherapy: Secondary | ICD-10-CM | POA: Insufficient documentation

## 2021-10-30 DIAGNOSIS — D492 Neoplasm of unspecified behavior of bone, soft tissue, and skin: Secondary | ICD-10-CM

## 2021-10-30 DIAGNOSIS — R5383 Other fatigue: Secondary | ICD-10-CM | POA: Insufficient documentation

## 2021-10-30 DIAGNOSIS — Z79899 Other long term (current) drug therapy: Secondary | ICD-10-CM | POA: Insufficient documentation

## 2021-10-30 DIAGNOSIS — K5900 Constipation, unspecified: Secondary | ICD-10-CM | POA: Diagnosis not present

## 2021-10-30 DIAGNOSIS — Z885 Allergy status to narcotic agent status: Secondary | ICD-10-CM | POA: Diagnosis not present

## 2021-10-30 DIAGNOSIS — M2550 Pain in unspecified joint: Secondary | ICD-10-CM | POA: Diagnosis not present

## 2021-10-30 DIAGNOSIS — C3431 Malignant neoplasm of lower lobe, right bronchus or lung: Secondary | ICD-10-CM | POA: Diagnosis present

## 2021-10-30 DIAGNOSIS — C499 Malignant neoplasm of connective and soft tissue, unspecified: Secondary | ICD-10-CM | POA: Diagnosis not present

## 2021-10-30 LAB — CMP (CANCER CENTER ONLY)
ALT: 20 U/L (ref 0–44)
AST: 28 U/L (ref 15–41)
Albumin: 4.2 g/dL (ref 3.5–5.0)
Alkaline Phosphatase: 92 U/L (ref 38–126)
Anion gap: 7 (ref 5–15)
BUN: 13 mg/dL (ref 6–20)
CO2: 27 mmol/L (ref 22–32)
Calcium: 9.2 mg/dL (ref 8.9–10.3)
Chloride: 106 mmol/L (ref 98–111)
Creatinine: 0.71 mg/dL (ref 0.44–1.00)
GFR, Estimated: >60 mL/min (ref >60)
Glucose, Bld: 72 mg/dL (ref 70–99)
Potassium: 4.1 mmol/L (ref 3.5–5.1)
Sodium: 140 mmol/L (ref 135–145)
Total Bilirubin: 0.5 mg/dL (ref 0.3–1.2)
Total Protein: 7.1 g/dL (ref 6.5–8.1)

## 2021-10-30 LAB — CBC WITH DIFFERENTIAL (CANCER CENTER ONLY)
Abs Immature Granulocytes: 0.01 10*3/uL (ref 0.00–0.07)
Basophils Absolute: 0 10*3/uL (ref 0.0–0.1)
Basophils Relative: 0 %
Eosinophils Absolute: 0.2 10*3/uL (ref 0.0–0.5)
Eosinophils Relative: 5 %
HCT: 36.3 % (ref 36.0–46.0)
Hemoglobin: 12.1 g/dL (ref 12.0–15.0)
Immature Granulocytes: 0 %
Lymphocytes Relative: 14 %
Lymphs Abs: 0.6 10*3/uL — ABNORMAL LOW (ref 0.7–4.0)
MCH: 29.5 pg (ref 26.0–34.0)
MCHC: 33.3 g/dL (ref 30.0–36.0)
MCV: 88.5 fL (ref 80.0–100.0)
Monocytes Absolute: 0.4 10*3/uL (ref 0.1–1.0)
Monocytes Relative: 10 %
Neutro Abs: 2.9 10*3/uL (ref 1.7–7.7)
Neutrophils Relative %: 71 %
Platelet Count: 143 10*3/uL — ABNORMAL LOW (ref 150–400)
RBC: 4.1 MIL/uL (ref 3.87–5.11)
RDW: 13.2 % (ref 11.5–15.5)
WBC Count: 4.1 10*3/uL (ref 4.0–10.5)
nRBC: 0 % (ref 0.0–0.2)

## 2021-10-30 LAB — TOTAL PROTEIN, URINE DIPSTICK: Protein, ur: NEGATIVE mg/dL

## 2021-10-30 MED ORDER — SODIUM CHLORIDE 0.9 % IV SOLN
5.0000 mg/kg | Freq: Once | INTRAVENOUS | Status: AC
  Administered 2021-10-30: 350 mg via INTRAVENOUS
  Filled 2021-10-30: qty 14

## 2021-10-30 MED ORDER — SODIUM CHLORIDE 0.9 % IV SOLN: Freq: Once | INTRAVENOUS | Status: AC

## 2021-10-30 NOTE — Patient Instructions (Signed)
[facility]  Discharge Instructions: ?Thank you for choosing [facility] to provide your oncology and hematology care.  ? ?If you have a lab appointment with the [facility], please go directly to the [facility] and check in at the registration area. ?  ?Wear comfortable clothing and clothing appropriate for easy access to any Portacath or PICC line.  ? ?We strive to give you quality time with your provider. You may need to reschedule your appointment if you arrive late (15 or more minutes).  Arriving late affects you and other patients whose appointments are after yours.  Also, if you miss three or more appointments without notifying the office, you may be dismissed from the clinic at the provider?s discretion.    ?  ?For prescription refill requests, have your pharmacy contact our office and allow 72 hours for refills to be completed.   ? ?Today you received the following chemotherapy and/or immunotherapy agents: Avastin   ?  ?To help prevent nausea and vomiting after your treatment, we encourage you to take your nausea medication as directed. ? ?BELOW ARE SYMPTOMS THAT SHOULD BE REPORTED IMMEDIATELY: ?*FEVER GREATER THAN 100.4 F (38 ?C) OR HIGHER ?*CHILLS OR SWEATING ?*NAUSEA AND VOMITING THAT IS NOT CONTROLLED WITH YOUR NAUSEA MEDICATION ?*UNUSUAL SHORTNESS OF BREATH ?*UNUSUAL BRUISING OR BLEEDING ?*URINARY PROBLEMS (pain or burning when urinating, or frequent urination) ?*BOWEL PROBLEMS (unusual diarrhea, constipation, pain near the anus) ?TENDERNESS IN MOUTH AND THROAT WITH OR WITHOUT PRESENCE OF ULCERS (sore throat, sores in mouth, or a toothache) ?UNUSUAL RASH, SWELLING OR PAIN  ?UNUSUAL VAGINAL DISCHARGE OR ITCHING  ? ?Items with * indicate a potential emergency and should be followed up as soon as possible or go to the Emergency Department if any problems should occur. ? ?Please show the CHEMOTHERAPY ALERT CARD or IMMUNOTHERAPY ALERT CARD at check-in to the  Emergency Department and triage nurse. ? ?Should you have questions after your visit or need to cancel or reschedule your appointment, please contact [facility]  Dept: [phone]  and follow the prompts.  Office hours are 8:00 a.m. to 4:30 p.m. Monday - Friday. Please note that voicemails left after 4:00 p.m. may not be returned until the following business day.  We are closed weekends and major holidays. You have access to a nurse at all times for urgent questions. Please call the main number to the clinic Dept: [phone] and follow the prompts. ? ? ?For any non-urgent questions, you may also contact your provider using MyChart. We now offer e-Visits for anyone 92 and older to request care online for non-urgent symptoms. For details visit [email or web address]. ?  ?Also download the MyChart app! Go to the app store, search "MyChart", open the app, select [facility], and log in with your MyChart username and password. ? ?Due to Covid, a mask is required upon entering the hospital/clinic. If you do not have a mask, one will be given to you upon arrival. For doctor visits, patients may have 1 support person aged 40 or older with them. For treatment visits, patients cannot have anyone with them due to current Covid guidelines and our immunocompromised population.  ? ?

## 2021-10-31 ENCOUNTER — Telehealth: Payer: Self-pay | Admitting: Medical Oncology

## 2021-10-31 ENCOUNTER — Telehealth: Payer: Self-pay

## 2021-10-31 NOTE — Telephone Encounter (Signed)
LM for Brenda Patel to call the office and speak with Dr. Worthy Flank nurse if she has any questions or concerns regarding side-effects from her treatment yesterday. Hope she is doing well this am. ?

## 2021-10-31 NOTE — Telephone Encounter (Signed)
-----   Message from Anastasia Pall, RN sent at 10/30/2021 10:41 AM EST ----- ?Regarding: 1st time Avastin, being seen by Dr. Julien Nordmann ?Patient received 1st Avastin infusion today, tolerated treatment well. Please follow up with her tomorrow, thank you.  ? ?

## 2021-10-31 NOTE — Telephone Encounter (Signed)
Pt said she did fine after avastin. I instructed her to log her BP and report  high BP, any bleeding. ?

## 2021-11-01 ENCOUNTER — Other Ambulatory Visit (HOSPITAL_COMMUNITY): Payer: Self-pay

## 2021-11-05 ENCOUNTER — Other Ambulatory Visit: Payer: Self-pay | Admitting: Physician Assistant

## 2021-11-05 DIAGNOSIS — D492 Neoplasm of unspecified behavior of bone, soft tissue, and skin: Secondary | ICD-10-CM

## 2021-11-12 ENCOUNTER — Encounter: Payer: Self-pay | Admitting: Internal Medicine

## 2021-11-12 NOTE — Progress Notes (Signed)
Called pt to introduce myself as her Arboriculturist, discuss copay assistance and the [employer].  I left a msg requesting she return my call if she's interested in applying for the grants.  ?

## 2021-11-12 NOTE — Progress Notes (Signed)
Pt returned my call and stated she does not need assistance at this time.  She can cover her OOP cost if any. ?

## 2021-11-13 ENCOUNTER — Inpatient Hospital Stay (HOSPITAL_BASED_OUTPATIENT_CLINIC_OR_DEPARTMENT_OTHER): Payer: BC Managed Care – PPO | Admitting: Internal Medicine

## 2021-11-13 ENCOUNTER — Inpatient Hospital Stay: Payer: BC Managed Care – PPO

## 2021-11-13 ENCOUNTER — Other Ambulatory Visit: Payer: Self-pay

## 2021-11-13 ENCOUNTER — Encounter: Payer: Self-pay | Admitting: Internal Medicine

## 2021-11-13 ENCOUNTER — Other Ambulatory Visit: Payer: BC Managed Care – PPO

## 2021-11-13 VITALS — BP 144/78 | HR 96 | Temp 97.0°F | Resp 17 | Ht 66.0 in | Wt 159.9 lb

## 2021-11-13 DIAGNOSIS — D492 Neoplasm of unspecified behavior of bone, soft tissue, and skin: Secondary | ICD-10-CM

## 2021-11-13 DIAGNOSIS — C3491 Malignant neoplasm of unspecified part of right bronchus or lung: Secondary | ICD-10-CM

## 2021-11-13 DIAGNOSIS — Z5111 Encounter for antineoplastic chemotherapy: Secondary | ICD-10-CM

## 2021-11-13 DIAGNOSIS — C3431 Malignant neoplasm of lower lobe, right bronchus or lung: Secondary | ICD-10-CM | POA: Diagnosis not present

## 2021-11-13 LAB — CBC WITH DIFFERENTIAL (CANCER CENTER ONLY)
Abs Immature Granulocytes: 0 10*3/uL (ref 0.00–0.07)
Basophils Absolute: 0 10*3/uL (ref 0.0–0.1)
Basophils Relative: 1 %
Eosinophils Absolute: 0.1 10*3/uL (ref 0.0–0.5)
Eosinophils Relative: 3 %
HCT: 36.2 % (ref 36.0–46.0)
Hemoglobin: 11.9 g/dL — ABNORMAL LOW (ref 12.0–15.0)
Immature Granulocytes: 0 %
Lymphocytes Relative: 14 %
Lymphs Abs: 0.3 10*3/uL — ABNORMAL LOW (ref 0.7–4.0)
MCH: 29.6 pg (ref 26.0–34.0)
MCHC: 32.9 g/dL (ref 30.0–36.0)
MCV: 90 fL (ref 80.0–100.0)
Monocytes Absolute: 0.2 10*3/uL (ref 0.1–1.0)
Monocytes Relative: 10 %
Neutro Abs: 1.5 10*3/uL — ABNORMAL LOW (ref 1.7–7.7)
Neutrophils Relative %: 72 %
Platelet Count: 209 10*3/uL (ref 150–400)
RBC: 4.02 MIL/uL (ref 3.87–5.11)
RDW: 14.4 % (ref 11.5–15.5)
WBC Count: 2.1 10*3/uL — ABNORMAL LOW (ref 4.0–10.5)
nRBC: 0 % (ref 0.0–0.2)

## 2021-11-13 LAB — CMP (CANCER CENTER ONLY)
ALT: 23 U/L (ref 0–44)
AST: 28 U/L (ref 15–41)
Albumin: 4.4 g/dL (ref 3.5–5.0)
Alkaline Phosphatase: 90 U/L (ref 38–126)
Anion gap: 8 (ref 5–15)
BUN: 16 mg/dL (ref 6–20)
CO2: 28 mmol/L (ref 22–32)
Calcium: 9.9 mg/dL (ref 8.9–10.3)
Chloride: 104 mmol/L (ref 98–111)
Creatinine: 0.81 mg/dL (ref 0.44–1.00)
GFR, Estimated: >60 mL/min (ref >60)
Glucose, Bld: 102 mg/dL — ABNORMAL HIGH (ref 70–99)
Potassium: 3.7 mmol/L (ref 3.5–5.1)
Sodium: 140 mmol/L (ref 135–145)
Total Bilirubin: 0.4 mg/dL (ref 0.3–1.2)
Total Protein: 7.6 g/dL (ref 6.5–8.1)

## 2021-11-13 LAB — TOTAL PROTEIN, URINE DIPSTICK: Protein, ur: <30 mg/dL — AB

## 2021-11-13 MED ORDER — SODIUM CHLORIDE 0.9 % IV SOLN: Freq: Once | INTRAVENOUS | Status: AC

## 2021-11-13 MED ORDER — SODIUM CHLORIDE 0.9 % IV SOLN
5.0000 mg/kg | Freq: Once | INTRAVENOUS | Status: AC
  Administered 2021-11-13: 350 mg via INTRAVENOUS
  Filled 2021-11-13: qty 14

## 2021-11-13 NOTE — Progress Notes (Signed)
?    [facility] ?[phone]   [phone] ? ?OFFICE PROGRESS NOTE ? ?[address] ?[place] [address] ? 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 ? ?PRIOR THERAPY:  ?1) Status post right Pleurx catheter placement for drainage of recurrent right pleural effusion. ?2) repeat biopsy on August 11, 2018 of the lung mass at [facility] and it showed atypical spindle cell proliferation. ?3) on September 23, 2018 she underwent bilateral transexternal thoracotomies for resection of the right pleural-based tumor with en bloc right lower lobe wedge resection and the pathology revealed 18 cm solitary fibrous tumor with negative margins. ?4) restaging CT scan of the chest on September 14, 2019 showed new 1.5 cm soft tissue lesion along the right subpulmonic diaphragmatic pleural surface worrisome for tumor recurrence.  She was followed by observation by Dr. Angelina Ok at [facility] ?5) SBRT to the right lower lobe/diaphragmatic lesion completed June 22, 2020. ?6) restaging scan on November 30, 2020 showed new right posterior pleural-based nodule followed by observation.  But repeat CT scan of the chest on 02/07/2021 showed enlarging right pleural mass/nodules. ?7) the patient is started treatment with pazopanib on 02/17/2021 400 mg titrated to 800 mg p.o. daily but her treatment was held between July 8 to March 05, 2021 secondary to fatigue and arthralgia.  She resumed her treatment at a dose of 400 mg p.o. daily for 3 days titrated to 600 mg p.o. daily but this was again held in mid August 2022.  She resumed her treatment on 04/23/2021 at 400 mg p.o. daily but this was discontinued in October 2022 secondary to disease progression was a scan on 05/30/2021 revealing progressive multifocal pleural-based  disease. ? ?CURRENT THERAPY: Temodar 150 Mg/M2 daily for days 1-7 and 15-21 every 4 weeks in addition to a Avastin 5 Mg/KG on days 8 and 22 every 4 weeks.  Started October 15, 2021.  Status post 1 cycle of treatment. ? ?INTERVAL HISTORY: ?Brenda Patel 61 y.o. female returns to the clinic today for follow-up visit.  The patient is feeling fine today with no concerning complaints except for mild fatigue.  She tolerated the first cycle of her treatment with Temodar and Avastin fairly well.  She skipped the eighth of the first cycle because of Port-A-Cath issues.  She denied having any headache or visual changes.  She has no nausea, vomiting, diarrhea or constipation.  She has no recent weight loss or night sweats.  She has some foggy brain at times.  She is here today for evaluation before starting Avastin on day 8 of cycle #2. ? ?MEDICAL HISTORY: ?Past Medical History:  ?Diagnosis Date  ? Asthma   ? Cancer [facility])   ? Depression   ? Dyspnea   ? Pleural effusion on right   ? Pleural mass   ? ? ?ALLERGIES:  is allergic to cyanoacrylate, other, codeine, and lentil. ? ?MEDICATIONS:  ?Current Outpatient Medications  ?Medication Sig Dispense Refill  ? Calcium Citrate 333 MG TABS Take 333 mg by mouth daily.    ? [facility] 12 HOUR NASAL DECONGESTANT 120 MG 12 hr tablet Take 120 mg by mouth daily.  10  ? fluticasone (FLONASE) 50 MCG/ACT nasal spray Place 1 spray into both nostrils daily as needed for allergies.  5  ? lidocaine-prilocaine (EMLA) cream Apply 1 application topically  as needed. (Patient taking differently: Apply 1 application topically as needed [facility]).) 30 g 2  ? meloxicam (MOBIC) 15 MG tablet Take 15 mg by mouth daily as needed for pain.     ? montelukast (SINGULAIR) 10 MG tablet Take 10 mg by mouth daily after supper.  10  ? ondansetron (ZOFRAN) 8 MG tablet TAKE 1 TABLET BY MOUTH EVERY 8 HOURS AS NEEDED FOR NAUSEA OR VOMITING. 18 tablet 1  ? temozolomide (TEMODAR) 140 MG capsule Take 2 capsules (280 mg total) by  mouth daily. Take on days 1-7 and days 15-21 of each 28 day cycle. May take on an empty stomach or at bedtime to decrease nausea & vomiting. 28 capsule 3  ? ?No current facility-administered medications for this visit.  ? ? ?SURGICAL HISTORY:  ?Past Surgical History:  ?Procedure Laterality Date  ? CHEST TUBE INSERTION Right 07/16/2018  ? Procedure: INSERTION PLEURAL DRAINAGE CATHETER, right;  Surgeon: Grace Isaac, MD;  Location: [facility];  Service: Thoracic;  Laterality: Right;  ? COLONOSCOPY  2017  ? [facility]  ? GRAFTS  ? IR IMAGING GUIDED PORT INSERTION  10/02/2021  ? IR RADIOLOGIST EVAL & MGMT  10/08/2021  ? IR RADIOLOGIST EVAL & MGMT  10/11/2021  ? IR REMOVAL TUN ACCESS W/ PORT W/O FL MOD SED  10/15/2021  ? IR THORACENTESIS ASP PLEURAL SPACE W/IMG GUIDE  07/03/2018  ? ? ?REVIEW OF SYSTEMS:  A comprehensive review of systems was negative except for: Constitutional: positive for fatigue  ? ?PHYSICAL EXAMINATION: General appearance: alert, cooperative, and no distress ?Head: Normocephalic, without obvious abnormality, atraumatic ?Neck: no adenopathy, no JVD, supple, symmetrical, trachea midline, and thyroid not enlarged, symmetric, no tenderness/mass/nodules ?Lymph nodes: Cervical, supraclavicular, and axillary nodes normal. ?Resp: clear to auscultation bilaterally ?Back: symmetric, no curvature. ROM normal. No CVA tenderness. ?Cardio: regular rate and rhythm, S1, S2 normal, no murmur, click, rub or gallop ?GI: soft, non-tender; bowel sounds normal; no masses,  no organomegaly ?Extremities: extremities normal, atraumatic, no cyanosis or edema ? ?ECOG PERFORMANCE STATUS: 1 - Symptomatic but completely ambulatory ? ?Blood pressure (!) 144/78, pulse 96, temperature (!) 97 ?F (36.1 ?C), temperature source Tympanic, resp. rate 17, height 5\' 6"  (1.676 m), weight 159 lb 14.4 oz (72.5 kg), SpO2 99 %. ? ?LABORATORY DATA: ?Lab Results  ?Component Value Date  ? WBC 2.1 (L) 11/13/2021  ? HGB 11.9 (L) 11/13/2021  ? HCT  36.2 11/13/2021  ? MCV 90.0 11/13/2021  ? PLT 209 11/13/2021  ? ? ?  Chemistry   ?   ?Component Value Date/Time  ? NA 140 10/30/2021 0803  ? K 4.1 10/30/2021 0803  ? CL 106 10/30/2021 0803  ? CO2 27 10/30/2021 0803  ? BUN 13 10/30/2021 0803  ? CREATININE 0.71 10/30/2021 0803  ?    ?Component Value Date/Time  ? CALCIUM 9.2 10/30/2021 0803  ? ALKPHOS 92 10/30/2021 0803  ? AST 28 10/30/2021 0803  ? ALT 20 10/30/2021 0803  ? BILITOT 0.5 10/30/2021 0803  ?  ? ? ? ?RADIOGRAPHIC STUDIES: ?IR REMOVAL TUN ACCESS W/ PORT W/O FL MOD SED ? 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 Maryelizabeth Kaufmann on 10/02/2021 returns with larger area of rash overlying the port insertion site, venotomy site, and medial left chest. The rash has progressively worsened over the weekend. Decision has been made to remove the port. EXAM: Removal of left IJ venous port MEDICATIONS:  None ANESTHESIA/SEDATION: None COMPLICATIONS: None immediate. PROCEDURE: Informed written consent was obtained from the patient after a thorough discussion of the procedural risks, benefits and alternatives. All questions were addressed. Maximal Sterile Barrier Technique was utilized including caps, mask, sterile gowns, sterile gloves, sterile drape, hand hygiene and skin antiseptic. A timeout was performed prior to the initiation of the procedure. A timeout was performed prior to the initiation of the procedure. Left anterior upper chest prepped and draped in the usual sterile fashion. Following local lidocaine administration, an incision was made at the inferior margin of the port pocket. The pocket was dissected, and the port and catheter were removed in their entirety. The incision was closed with deep and superficial absorbable suture. No Dermabond was utilized as the patient stated that she had a sensitivity to the material. IMPRESSION: Successful left IJ vein Port-A-Cath explant.  Electronically Signed   By: Miachel Roux M.D.   On: 10/15/2021 13:08   ? ? ?ASSESSMENT AND PLAN: This is a very pleasant 62 years old white female presented with large right lower lobe lung mass in additio

## 2021-11-13 NOTE — Patient Instructions (Addendum)
[facility]  Discharge Instructions: ?Thank you for choosing [facility] to provide your oncology and hematology care.  ? ?If you have a lab appointment with the [facility], please go directly to the [facility] and check in at the registration area. ?  ?Wear comfortable clothing and clothing appropriate for easy access to any Portacath or PICC line.  ? ?We strive to give you quality time with your provider. You may need to reschedule your appointment if you arrive late (15 or more minutes).  Arriving late affects you and other patients whose appointments are after yours.  Also, if you miss three or more appointments without notifying the office, you may be dismissed from the clinic at the provider?s discretion.    ?  ?For prescription refill requests, have your pharmacy contact our office and allow 72 hours for refills to be completed.   ? ?Today you received the following chemotherapy and/or immunotherapy agents Bevacizumab   ?  ?To help prevent nausea and vomiting after your treatment, we encourage you to take your nausea medication as directed. ? ?BELOW ARE SYMPTOMS THAT SHOULD BE REPORTED IMMEDIATELY: ?*FEVER GREATER THAN 100.4 F (38 ?C) OR HIGHER ?*CHILLS OR SWEATING ?*NAUSEA AND VOMITING THAT IS NOT CONTROLLED WITH YOUR NAUSEA MEDICATION ?*UNUSUAL SHORTNESS OF BREATH ?*UNUSUAL BRUISING OR BLEEDING ?*URINARY PROBLEMS (pain or burning when urinating, or frequent urination) ?*BOWEL PROBLEMS (unusual diarrhea, constipation, pain near the anus) ?TENDERNESS IN MOUTH AND THROAT WITH OR WITHOUT PRESENCE OF ULCERS (sore throat, sores in mouth, or a toothache) ?UNUSUAL RASH, SWELLING OR PAIN  ?UNUSUAL VAGINAL DISCHARGE OR ITCHING  ? ?Items with * indicate a potential emergency and should be followed up as soon as possible or go to the Emergency Department if any problems should occur. ? ?Please show the CHEMOTHERAPY ALERT CARD or IMMUNOTHERAPY ALERT CARD at check-in to  the Emergency Department and triage nurse. ? ?Should you have questions after your visit or need to cancel or reschedule your appointment, please contact [facility]  Dept: [phone]  and follow the prompts.  Office hours are 8:00 a.m. to 4:30 p.m. Monday - Friday. Please note that voicemails left after 4:00 p.m. may not be returned until the following business day.  We are closed weekends and major holidays. You have access to a nurse at all times for urgent questions. Please call the main number to the clinic Dept: [phone] and follow the prompts. ? ? ?For any non-urgent questions, you may also contact your provider using MyChart. We now offer e-Visits for anyone 59 and older to request care online for non-urgent symptoms. For details visit [email or web address]. ?  ?Also download the MyChart app! Go to the app store, search "MyChart", open the app, select [facility], and log in with your MyChart username and password. ? ?Due to Covid, a mask is required upon entering the hospital/clinic. If you do not have a mask, one will be given to you upon arrival. For doctor visits, patients may have 1 support person aged 62 or older with them. For treatment visits, patients cannot have anyone with them due to current Covid guidelines and our immunocompromised population.  ? ?Bevacizumab injection ?What is this medication? ?BEVACIZUMAB (be va SIZ yoo mab) is a monoclonal antibody. It is used to treat many types of cancer. ?This medicine may be used for other purposes; ask your health care provider or pharmacist if you have questions. ?COMMON BRAND NAME(S): Alymsys, Avastin, MVASI,  Zirabev ?What should I tell my care team before I take this medication? ?They need to know if you have any of these conditions: ?diabetes ?heart disease ?high blood pressure ?history of coughing up blood ?prior anthracycline chemotherapy (e.g., doxorubicin, daunorubicin, epirubicin) ?recent or ongoing  radiation therapy ?recent or planning to have surgery ?stroke ?an unusual or allergic reaction to bevacizumab, hamster proteins, mouse proteins, other medicines, foods, dyes, or preservatives ?pregnant or trying to get pregnant ?breast-feeding ?How should I use this medication? ?This medicine is for infusion into a vein. It is given by a health care professional in a hospital or clinic setting. ?Talk to your pediatrician regarding the use of this medicine in children. Special care may be needed. ?Overdosage: If you think you have taken too much of this medicine contact a poison control center or emergency room at once. ?NOTE: This medicine is only for you. Do not share this medicine with others. ?What if I miss a dose? ?It is important not to miss your dose. Call your doctor or health care professional if you are unable to keep an appointment. ?What may interact with this medication? ?Interactions are not expected. ?This list may not describe all possible interactions. Give your health care provider a list of all the medicines, herbs, non-prescription drugs, or dietary supplements you use. Also tell them if you smoke, drink alcohol, or use illegal drugs. Some items may interact with your medicine. ?What should I watch for while using this medication? ?Your condition will be monitored carefully while you are receiving this medicine. You will need important blood work and urine testing done while you are taking this medicine. ?This medicine may increase your risk to bruise or bleed. Call your doctor or health care professional if you notice any unusual bleeding. ?Before having surgery, talk to your health care provider to make sure it is ok. This drug can increase the risk of poor healing of your surgical site or wound. You will need to stop this drug for 28 days before surgery. After surgery, wait at least 28 days before restarting this drug. Make sure the surgical site or wound is healed enough before restarting  this drug. Talk to your health care provider if questions. ?Do not become pregnant while taking this medicine or for 6 months after stopping it. Women should inform their doctor if they wish to become pregnant or think they might be pregnant. There is a potential for serious side effects to an unborn child. Talk to your health care professional or pharmacist for more information. Do not breast-feed an infant while taking this medicine and for 6 months after the last dose. ?This medicine has caused ovarian failure in some women. This medicine may interfere with the ability to have a child. You should talk to your doctor or health care professional if you are concerned about your fertility. ?What side effects may I notice from receiving this medication? ?Side effects that you should report to your doctor or health care professional as soon as possible: ?allergic reactions like skin rash, itching or hives, swelling of the face, lips, or tongue ?chest pain or chest tightness ?chills ?coughing up blood ?high fever ?seizures ?severe constipation ?signs and symptoms of bleeding such as bloody or black, tarry stools; red or dark-brown urine; spitting up blood or brown material that looks like coffee grounds; red spots on the skin; unusual bruising or bleeding from the eye, gums, or nose ?signs and symptoms of a blood clot such as breathing problems; chest  pain; severe, sudden headache; pain, swelling, warmth in the leg ?signs and symptoms of a stroke like changes in vision; confusion; trouble speaking or understanding; severe headaches; sudden numbness or weakness of the face, arm or leg; trouble walking; dizziness; loss of balance or coordination ?stomach pain ?sweating ?swelling of legs or ankles ?vomiting ?weight gain ?Side effects that usually do not require medical attention (report to your doctor or health care professional if they continue or are bothersome): ?back pain ?changes in taste ?decreased appetite ?dry  skin ?nausea ?tiredness ?This list may not describe all possible side effects. Call your doctor for medical advice about side effects. You may report side effects to FDA at 1-800-FDA-1088. ?Where should I keep my m

## 2021-11-15 ENCOUNTER — Other Ambulatory Visit (HOSPITAL_COMMUNITY): Payer: Self-pay

## 2021-11-20 ENCOUNTER — Ambulatory Visit: Payer: BC Managed Care – PPO | Admitting: Physician Assistant

## 2021-11-20 ENCOUNTER — Other Ambulatory Visit (HOSPITAL_COMMUNITY): Payer: Self-pay

## 2021-11-20 ENCOUNTER — Other Ambulatory Visit: Payer: BC Managed Care – PPO

## 2021-11-20 ENCOUNTER — Ambulatory Visit: Payer: BC Managed Care – PPO

## 2021-11-22 ENCOUNTER — Other Ambulatory Visit (HOSPITAL_COMMUNITY): Payer: Self-pay

## 2021-11-23 NOTE — Progress Notes (Signed)
[facility] ?OFFICE PROGRESS NOTE ? ?[address], [address] ?[place] [address] ? 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 ? ?PRIOR THERAPY: 1) Status post right Pleurx catheter placement for drainage of recurrent right pleural effusion. ?2) repeat biopsy on August 11, 2018 of the lung mass at [facility] and it showed atypical spindle cell proliferation. ?3) on September 23, 2018 she underwent bilateral transexternal thoracotomies for resection of the right pleural-based tumor with en bloc right lower lobe wedge resection and the pathology revealed 18 cm solitary fibrous tumor with negative margins. ?4) restaging CT scan of the chest on September 14, 2019 showed new 1.5 cm soft tissue lesion along the right subpulmonic diaphragmatic pleural surface worrisome for tumor recurrence.  She was followed by observation by Dr. Angelina Ok at [place] [facility] ?5) SBRT to the right lower lobe/diaphragmatic lesion completed June 22, 2020. ?6) restaging scan on November 30, 2020 showed new right posterior pleural-based nodule followed by observation.  But repeat CT scan of the chest on 02/07/2021 showed enlarging right pleural mass/nodules. ?7) the patient is started treatment with pazopanib on 02/17/2021 400 mg titrated to 800 mg p.o. daily but her treatment was held between July 8 to March 05, 2021 secondary to fatigue and arthralgia.  She resumed her treatment at a dose of 400 mg p.o. daily for 3 days titrated to 600 mg p.o. daily but this was again held in mid August 2022.  She resumed her treatment on 04/23/2021 at 400 mg p.o. daily but this was discontinued in October 2022 secondary to disease progression was a scan on 05/30/2021 revealing progressive multifocal pleural-based disease. ? ?CURRENT THERAPY: Avastin and Temodar as  recommended by Dr. Angelina Ok at [place] [facility]. ?First dose of temodar expected on 10/30/21. Temodar days 1-7, and 15-21 p.o. every 4 weeks with IV avastin 5 mg/kg on days 8 and 22 every 4 weeks. She missed day 8 cycle #1 due to issues with her port-a-cath.  ? ?INTERVAL HISTORY: ?Brenda Patel 61 y.o. female returns to the clinic today for a follow-up visit. In October 2022, the patient was seen by Dr. Angelina Ok at [place] [facility].  She had some evidence of progression at that time and was recommended that she start treatment with bevacizumab and Temodar.  The patient wished to delay treatment until after the holidays. She had a wonderful holiday and a nice vacation.  The patient had a CT scan on 09/20/2021 that continued to show progressive multifocal pleural-based disease.  Dr. Angelina Ok recommended that the patient follow-up with our clinic as she is going to receive her treatment under our care which is closer to home. Dr. Angelina Ok also recommends that the patient have a Port-A-Cath placement as well as reimage every 2 cycles of treatment. She had some issues with allergy to the port-a-cath. This was ultimately removed.  ?  ? She took her days 15-21 of temodar for cycle #2. She tolerated it without any appreciable adverse side effects. Overall, the patient is feeling fairly well today without any concerning complaints.  She denies any fever, chills, night sweats, or unexplained weight loss. Denies any nausea, vomiting, or diarrhea. She has mild constipation for which she treats with OTC medication. Denies any shortness of breath, cough, or hemoptysis.  Denies any abnormal bleeding or bruising. She has been monitoring her BP  closely at home. The patient is here today for evaluation before undergoing day 22 cycle 2 of avastin.  ? ? ? ? ?MEDICAL HISTORY: ?Past Medical History:  ?Diagnosis Date  ? Asthma   ? Cancer [facility])   ? Depression   ? Dyspnea   ? Pleural effusion on right   ? Pleural mass    ? ? ?ALLERGIES:  is allergic to cyanoacrylate, other, codeine, and lentil. ? ?MEDICATIONS:  ?Current Outpatient Medications  ?Medication Sig Dispense Refill  ? Calcium Citrate 333 MG TABS Take 333 mg by mouth daily.    ? [facility] 12 HOUR NASAL DECONGESTANT 120 MG 12 hr tablet Take 120 mg by mouth daily.  10  ? fluticasone (FLONASE) 50 MCG/ACT nasal spray Place 1 spray into both nostrils daily as needed for allergies.  5  ? meloxicam (MOBIC) 15 MG tablet Take 15 mg by mouth daily as needed for pain.     ? montelukast (SINGULAIR) 10 MG tablet Take 10 mg by mouth daily after supper.  10  ? ondansetron (ZOFRAN) 8 MG tablet TAKE 1 TABLET BY MOUTH EVERY 8 HOURS AS NEEDED FOR NAUSEA OR VOMITING. 18 tablet 1  ? temozolomide (TEMODAR) 140 MG capsule Take 2 capsules (280 mg total) by mouth daily. Take on days 1-7 and days 15-21 of each 28 day cycle. May take on an empty stomach or at bedtime to decrease nausea & vomiting. 28 capsule 3  ? ?No current facility-administered medications for this visit.  ? ? ?SURGICAL HISTORY:  ?Past Surgical History:  ?Procedure Laterality Date  ? CHEST TUBE INSERTION Right 07/16/2018  ? Procedure: INSERTION PLEURAL DRAINAGE CATHETER, right;  Surgeon: Grace Isaac, MD;  Location: [facility];  Service: Thoracic;  Laterality: Right;  ? COLONOSCOPY  2017  ? [place]  ? GRAFTS  ? IR IMAGING GUIDED PORT INSERTION  10/02/2021  ? IR RADIOLOGIST EVAL & MGMT  10/08/2021  ? IR RADIOLOGIST EVAL & MGMT  10/11/2021  ? IR REMOVAL TUN ACCESS W/ PORT W/O FL MOD SED  10/15/2021  ? IR THORACENTESIS ASP PLEURAL SPACE W/IMG GUIDE  07/03/2018  ? ? ?REVIEW OF SYSTEMS:   ?Review of Systems  ?Constitutional: Negative for appetite change, chills, fatigue, fever and unexpected weight change.  ?HENT:   Negative for mouth sores, nosebleeds, sore throat and trouble swallowing.   ?Eyes: Negative for eye problems and icterus.  ?Respiratory: Negative for cough, hemoptysis, shortness of breath and wheezing.   ?Cardiovascular:  Negative for chest pain and leg swelling.  ?Gastrointestinal: Negative for abdominal pain, constipation, diarrhea, nausea and vomiting.  ?Genitourinary: Negative for bladder incontinence, difficulty urinating, dysuria, frequency and hematuria.   ?Musculoskeletal: Negative for back pain, gait problem, neck pain and neck stiffness.  ?Skin: Negative for itching and rash.  ?Neurological: Negative for dizziness, extremity weakness, gait problem, headaches, light-headedness and seizures.  ?Hematological: Negative for adenopathy. Does not bruise/bleed easily.  ?Psychiatric/Behavioral: Negative for confusion, depression and sleep disturbance. The patient is not nervous/anxious.   ? ? ?PHYSICAL EXAMINATION:  ?Blood pressure 133/80, pulse (!) 111, temperature 98.1 ?F (36.7 ?C), temperature source Oral, resp. rate 18, height 5\' 6"  (1.676 m), weight 159 lb 12.8 oz (72.5 kg), SpO2 98 %. ? ?ECOG PERFORMANCE STATUS: 1 ? ?Physical Exam  ?Constitutional: Oriented to person, place, and time and thin appearing female and in no distress.  ?HENT:  ?Head: Normocephalic and atraumatic.  ?Mouth/Throat: Oropharynx is clear and moist. No oropharyngeal exudate.  ?Eyes: Conjunctivae are normal.  Right eye exhibits no discharge. Left eye exhibits no discharge. No scleral icterus.  ?Neck: Normal range of motion. Neck supple.  ?Cardiovascular: Normal rate, regular rhythm, normal heart sounds and intact distal pulses.   ?Pulmonary/Chest: Effort normal. Quiet breath sounds in right lung. No respiratory distress. No wheezes. No rales.  ?Abdominal: Soft. Bowel sounds are normal. Exhibits no distension and no mass. There is no tenderness.  ?Musculoskeletal: Normal range of motion. Exhibits no edema.  ?Lymphadenopathy:  ?  No cervical adenopathy.  ?Neurological: Alert and oriented to person, place, and time. Exhibits normal muscle tone. Gait normal. Coordination normal.  ?Skin: Improving erythema over port-a-cath site. Skin is warm and dry. Not  diaphoretic. No pallor.  ?Psychiatric: Mood, memory and judgment normal.  ?Vitals reviewed. ?  ? ?LABORATORY DATA: ?Lab Results  ?Component Value Date  ? WBC 4.9 11/27/2021  ? HGB 12.4 11/27/2021  ? HCT 37.9 11/27/2021  ? MCV 92.

## 2021-11-26 ENCOUNTER — Other Ambulatory Visit (HOSPITAL_COMMUNITY): Payer: Self-pay

## 2021-11-27 ENCOUNTER — Other Ambulatory Visit: Payer: BC Managed Care – PPO

## 2021-11-27 ENCOUNTER — Inpatient Hospital Stay: Payer: BC Managed Care – PPO

## 2021-11-27 ENCOUNTER — Inpatient Hospital Stay: Payer: BC Managed Care – PPO | Attending: Internal Medicine | Admitting: Physician Assistant

## 2021-11-27 ENCOUNTER — Other Ambulatory Visit: Payer: Self-pay

## 2021-11-27 VITALS — BP 133/80 | HR 111 | Temp 98.1°F | Resp 18 | Ht 66.0 in | Wt 159.8 lb

## 2021-11-27 VITALS — BP 133/79 | HR 85

## 2021-11-27 DIAGNOSIS — Z7952 Long term (current) use of systemic steroids: Secondary | ICD-10-CM | POA: Diagnosis not present

## 2021-11-27 DIAGNOSIS — Z5111 Encounter for antineoplastic chemotherapy: Secondary | ICD-10-CM | POA: Diagnosis not present

## 2021-11-27 DIAGNOSIS — R490 Dysphonia: Secondary | ICD-10-CM | POA: Insufficient documentation

## 2021-11-27 DIAGNOSIS — Z885 Allergy status to narcotic agent status: Secondary | ICD-10-CM | POA: Diagnosis not present

## 2021-11-27 DIAGNOSIS — Z5112 Encounter for antineoplastic immunotherapy: Secondary | ICD-10-CM | POA: Diagnosis present

## 2021-11-27 DIAGNOSIS — D492 Neoplasm of unspecified behavior of bone, soft tissue, and skin: Secondary | ICD-10-CM

## 2021-11-27 DIAGNOSIS — R21 Rash and other nonspecific skin eruption: Secondary | ICD-10-CM | POA: Insufficient documentation

## 2021-11-27 DIAGNOSIS — C3431 Malignant neoplasm of lower lobe, right bronchus or lung: Secondary | ICD-10-CM | POA: Diagnosis present

## 2021-11-27 DIAGNOSIS — R5383 Other fatigue: Secondary | ICD-10-CM | POA: Diagnosis not present

## 2021-11-27 DIAGNOSIS — C3491 Malignant neoplasm of unspecified part of right bronchus or lung: Secondary | ICD-10-CM | POA: Diagnosis not present

## 2021-11-27 DIAGNOSIS — Z79899 Other long term (current) drug therapy: Secondary | ICD-10-CM | POA: Diagnosis not present

## 2021-11-27 DIAGNOSIS — M2550 Pain in unspecified joint: Secondary | ICD-10-CM | POA: Diagnosis not present

## 2021-11-27 DIAGNOSIS — R232 Flushing: Secondary | ICD-10-CM | POA: Diagnosis not present

## 2021-11-27 DIAGNOSIS — Z79630 Long term (current) use of alkylating agent: Secondary | ICD-10-CM | POA: Insufficient documentation

## 2021-11-27 DIAGNOSIS — J45909 Unspecified asthma, uncomplicated: Secondary | ICD-10-CM | POA: Diagnosis not present

## 2021-11-27 LAB — CBC WITH DIFFERENTIAL (CANCER CENTER ONLY)
Abs Immature Granulocytes: 0.01 10*3/uL (ref 0.00–0.07)
Basophils Absolute: 0 10*3/uL (ref 0.0–0.1)
Basophils Relative: 0 %
Eosinophils Absolute: 0 10*3/uL (ref 0.0–0.5)
Eosinophils Relative: 1 %
HCT: 37.9 % (ref 36.0–46.0)
Hemoglobin: 12.4 g/dL (ref 12.0–15.0)
Immature Granulocytes: 0 %
Lymphocytes Relative: 6 %
Lymphs Abs: 0.3 10*3/uL — ABNORMAL LOW (ref 0.7–4.0)
MCH: 30.1 pg (ref 26.0–34.0)
MCHC: 32.7 g/dL (ref 30.0–36.0)
MCV: 92 fL (ref 80.0–100.0)
Monocytes Absolute: 0.6 10*3/uL (ref 0.1–1.0)
Monocytes Relative: 13 %
Neutro Abs: 3.9 10*3/uL (ref 1.7–7.7)
Neutrophils Relative %: 80 %
Platelet Count: 195 10*3/uL (ref 150–400)
RBC: 4.12 MIL/uL (ref 3.87–5.11)
RDW: 16.3 % — ABNORMAL HIGH (ref 11.5–15.5)
WBC Count: 4.9 10*3/uL (ref 4.0–10.5)
nRBC: 0 % (ref 0.0–0.2)

## 2021-11-27 LAB — CMP (CANCER CENTER ONLY)
ALT: 26 U/L (ref 0–44)
AST: 35 U/L (ref 15–41)
Albumin: 4.3 g/dL (ref 3.5–5.0)
Alkaline Phosphatase: 85 U/L (ref 38–126)
Anion gap: 8 (ref 5–15)
BUN: 18 mg/dL (ref 6–20)
CO2: 28 mmol/L (ref 22–32)
Calcium: 9.8 mg/dL (ref 8.9–10.3)
Chloride: 105 mmol/L (ref 98–111)
Creatinine: 0.84 mg/dL (ref 0.44–1.00)
GFR, Estimated: >60 mL/min (ref >60)
Glucose, Bld: 100 mg/dL — ABNORMAL HIGH (ref 70–99)
Potassium: 4.2 mmol/L (ref 3.5–5.1)
Sodium: 141 mmol/L (ref 135–145)
Total Bilirubin: 0.5 mg/dL (ref 0.3–1.2)
Total Protein: 7.4 g/dL (ref 6.5–8.1)

## 2021-11-27 LAB — TOTAL PROTEIN, URINE DIPSTICK: Protein, ur: <30 mg/dL — AB

## 2021-11-27 MED ORDER — SODIUM CHLORIDE 0.9 % IV SOLN
5.0000 mg/kg | Freq: Once | INTRAVENOUS | Status: AC
  Administered 2021-11-27: 350 mg via INTRAVENOUS
  Filled 2021-11-27: qty 14

## 2021-11-27 MED ORDER — SODIUM CHLORIDE 0.9 % IV SOLN: Freq: Once | INTRAVENOUS | Status: AC

## 2021-11-27 NOTE — Patient Instructions (Signed)
[facility]  Discharge Instructions: ?Thank you for choosing [facility] to provide your oncology and hematology care.  ? ?If you have a lab appointment with the [facility], please go directly to the [facility] and check in at the registration area. ?  ?Wear comfortable clothing and clothing appropriate for easy access to any Portacath or PICC line.  ? ?We strive to give you quality time with your provider. You may need to reschedule your appointment if you arrive late (15 or more minutes).  Arriving late affects you and other patients whose appointments are after yours.  Also, if you miss three or more appointments without notifying the office, you may be dismissed from the clinic at the provider?s discretion.    ?  ?For prescription refill requests, have your pharmacy contact our office and allow 72 hours for refills to be completed.   ? ?Today you received the following chemotherapy and/or immunotherapy agent: Zirabev (bevacizumab-bvzr)    ?  ?To help prevent nausea and vomiting after your treatment, we encourage you to take your nausea medication as directed. ? ?BELOW ARE SYMPTOMS THAT SHOULD BE REPORTED IMMEDIATELY: ?*FEVER GREATER THAN 100.4 F (38 ?C) OR HIGHER ?*CHILLS OR SWEATING ?*NAUSEA AND VOMITING THAT IS NOT CONTROLLED WITH YOUR NAUSEA MEDICATION ?*UNUSUAL SHORTNESS OF BREATH ?*UNUSUAL BRUISING OR BLEEDING ?*URINARY PROBLEMS (pain or burning when urinating, or frequent urination) ?*BOWEL PROBLEMS (unusual diarrhea, constipation, pain near the anus) ?TENDERNESS IN MOUTH AND THROAT WITH OR WITHOUT PRESENCE OF ULCERS (sore throat, sores in mouth, or a toothache) ?UNUSUAL RASH, SWELLING OR PAIN  ?UNUSUAL VAGINAL DISCHARGE OR ITCHING  ? ?Items with * indicate a potential emergency and should be followed up as soon as possible or go to the Emergency Department if any problems should occur. ? ?Please show the CHEMOTHERAPY ALERT CARD or IMMUNOTHERAPY ALERT CARD  at check-in to the Emergency Department and triage nurse. ? ?Should you have questions after your visit or need to cancel or reschedule your appointment, please contact [facility]  Dept: [phone]  and follow the prompts.  Office hours are 8:00 a.m. to 4:30 p.m. Monday - Friday. Please note that voicemails left after 4:00 p.m. may not be returned until the following business day.  We are closed weekends and major holidays. You have access to a nurse at all times for urgent questions. Please call the main number to the clinic Dept: [phone] and follow the prompts. ? ? ?For any non-urgent questions, you may also contact your provider using MyChart. We now offer e-Visits for anyone 29 and older to request care online for non-urgent symptoms. For details visit [email or web address]. ?  ?Also download the MyChart app! Go to the app store, search "MyChart", open the app, select [facility], and log in with your MyChart username and password. ? ?Due to Covid, a mask is required upon entering the hospital/clinic. If you do not have a mask, one will be given to you upon arrival. For doctor visits, patients may have 1 support person aged 41 or older with them. For treatment visits, patients cannot have anyone with them due to current Covid guidelines and our immunocompromised population.  ? ?

## 2021-11-28 ENCOUNTER — Other Ambulatory Visit (HOSPITAL_COMMUNITY): Payer: Self-pay

## 2021-11-28 ENCOUNTER — Other Ambulatory Visit: Payer: Self-pay | Admitting: Medical Oncology

## 2021-12-10 ENCOUNTER — Encounter: Payer: Self-pay | Admitting: Internal Medicine

## 2021-12-10 ENCOUNTER — Inpatient Hospital Stay: Payer: BC Managed Care – PPO

## 2021-12-10 ENCOUNTER — Other Ambulatory Visit: Payer: Self-pay

## 2021-12-10 ENCOUNTER — Inpatient Hospital Stay: Payer: BC Managed Care – PPO | Admitting: Internal Medicine

## 2021-12-10 VITALS — BP 145/81 | HR 100 | Temp 97.5°F | Resp 16 | Ht 66.0 in | Wt 159.5 lb

## 2021-12-10 DIAGNOSIS — C3491 Malignant neoplasm of unspecified part of right bronchus or lung: Secondary | ICD-10-CM

## 2021-12-10 DIAGNOSIS — D492 Neoplasm of unspecified behavior of bone, soft tissue, and skin: Secondary | ICD-10-CM

## 2021-12-10 DIAGNOSIS — Z5111 Encounter for antineoplastic chemotherapy: Secondary | ICD-10-CM

## 2021-12-10 DIAGNOSIS — C3431 Malignant neoplasm of lower lobe, right bronchus or lung: Secondary | ICD-10-CM | POA: Diagnosis not present

## 2021-12-10 LAB — CBC WITH DIFFERENTIAL (CANCER CENTER ONLY)
Abs Immature Granulocytes: 0.02 10*3/uL (ref 0.00–0.07)
Basophils Absolute: 0 10*3/uL (ref 0.0–0.1)
Basophils Relative: 0 %
Eosinophils Absolute: 0.6 10*3/uL — ABNORMAL HIGH (ref 0.0–0.5)
Eosinophils Relative: 9 %
HCT: 36.8 % (ref 36.0–46.0)
Hemoglobin: 12.4 g/dL (ref 12.0–15.0)
Immature Granulocytes: 0 %
Lymphocytes Relative: 4 %
Lymphs Abs: 0.2 10*3/uL — ABNORMAL LOW (ref 0.7–4.0)
MCH: 30.8 pg (ref 26.0–34.0)
MCHC: 33.7 g/dL (ref 30.0–36.0)
MCV: 91.5 fL (ref 80.0–100.0)
Monocytes Absolute: 0.8 10*3/uL (ref 0.1–1.0)
Monocytes Relative: 13 %
Neutro Abs: 4.5 10*3/uL (ref 1.7–7.7)
Neutrophils Relative %: 74 %
Platelet Count: 42 10*3/uL — ABNORMAL LOW (ref 150–400)
RBC: 4.02 MIL/uL (ref 3.87–5.11)
RDW: 17.4 % — ABNORMAL HIGH (ref 11.5–15.5)
WBC Count: 6 10*3/uL (ref 4.0–10.5)
nRBC: 0 % (ref 0.0–0.2)

## 2021-12-10 LAB — CMP (CANCER CENTER ONLY)
ALT: 22 U/L (ref 0–44)
AST: 24 U/L (ref 15–41)
Albumin: 4.2 g/dL (ref 3.5–5.0)
Alkaline Phosphatase: 90 U/L (ref 38–126)
Anion gap: 6 (ref 5–15)
BUN: 22 mg/dL — ABNORMAL HIGH (ref 6–20)
CO2: 31 mmol/L (ref 22–32)
Calcium: 9.8 mg/dL (ref 8.9–10.3)
Chloride: 103 mmol/L (ref 98–111)
Creatinine: 0.83 mg/dL (ref 0.44–1.00)
GFR, Estimated: >60 mL/min (ref >60)
Glucose, Bld: 108 mg/dL — ABNORMAL HIGH (ref 70–99)
Potassium: 3.8 mmol/L (ref 3.5–5.1)
Sodium: 140 mmol/L (ref 135–145)
Total Bilirubin: 0.5 mg/dL (ref 0.3–1.2)
Total Protein: 7.4 g/dL (ref 6.5–8.1)

## 2021-12-10 LAB — TOTAL PROTEIN, URINE DIPSTICK: Protein, ur: <30 mg/dL — AB

## 2021-12-10 MED ORDER — METHYLPREDNISOLONE 4 MG PO TBPK: ORAL_TABLET | ORAL | 0 refills | Status: DC

## 2021-12-10 NOTE — Progress Notes (Signed)
?    [facility] ?[phone]   [phone] ? ?OFFICE PROGRESS NOTE ? ?[address], [address] ?[place] [address] ? 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 ? ?PRIOR THERAPY:  ?1) Status post right Pleurx catheter placement for drainage of recurrent right pleural effusion. ?2) repeat biopsy on August 11, 2018 of the lung mass at [facility] and it showed atypical spindle cell proliferation. ?3) on September 23, 2018 she underwent bilateral transexternal thoracotomies for resection of the right pleural-based tumor with en bloc right lower lobe wedge resection and the pathology revealed 18 cm solitary fibrous tumor with negative margins. ?4) restaging CT scan of the chest on September 14, 2019 showed new 1.5 cm soft tissue lesion along the right subpulmonic diaphragmatic pleural surface worrisome for tumor recurrence.  She was followed by observation by Dr. Angelina Ok at [place] [facility] ?5) SBRT to the right lower lobe/diaphragmatic lesion completed June 22, 2020. ?6) restaging scan on November 30, 2020 showed new right posterior pleural-based nodule followed by observation.  But repeat CT scan of the chest on 02/07/2021 showed enlarging right pleural mass/nodules. ?7) the patient is started treatment with pazopanib on 02/17/2021 400 mg titrated to 800 mg p.o. daily but her treatment was held between July 8 to March 05, 2021 secondary to fatigue and arthralgia.  She resumed her treatment at a dose of 400 mg p.o. daily for 3 days titrated to 600 mg p.o. daily but this was again held in mid August 2022.  She resumed her treatment on 04/23/2021 at 400 mg p.o. daily but this was discontinued in October 2022 secondary to disease progression was a scan on 05/30/2021 revealing progressive multifocal pleural-based  disease. ? ?CURRENT THERAPY: Temodar 150 Mg/M2 daily for days 1-7 and 15-21 every 4 weeks in addition to a Avastin 5 Mg/KG on days 8 and 22 every 4 weeks.  Started October 15, 2021.  Status post 2 cycles of treatment. ? ?INTERVAL HISTORY: ?Brenda Patel 61 y.o. female returns to the clinic today for follow-up visit.  The patient is feeling fine today with no concerning complaints except for some itching and she also noticed some red dots on her skin likely petechiae.  She denied having any current chest pain, shortness of breath, cough or hemoptysis.  She denied having any fever or chills.  She has no nausea, vomiting, diarrhea or constipation.  She has some floating in her eyes.  The patient has been tolerating her treatment fairly well.  She is here today for evaluation before restarting her dose of Avastin today. ? ? ?MEDICAL HISTORY: ?Past Medical History:  ?Diagnosis Date  ? Asthma   ? Cancer [facility])   ? Depression   ? Dyspnea   ? Pleural effusion on right   ? Pleural mass   ? ? ?ALLERGIES:  is allergic to cyanoacrylate, other, codeine, and lentil. ? ?MEDICATIONS:  ?Current Outpatient Medications  ?Medication Sig Dispense Refill  ? Calcium Citrate 333 MG TABS Take 333 mg by mouth daily.    ? [facility] 12 HOUR NASAL DECONGESTANT 120 MG 12 hr tablet Take 120 mg by mouth daily.  10  ? fluticasone (FLONASE) 50 MCG/ACT nasal spray Place 1 spray into both nostrils daily as needed for allergies.  5  ? meloxicam (MOBIC) 15 MG tablet Take 15 mg by mouth daily  as needed for pain.     ? montelukast (SINGULAIR) 10 MG tablet Take 10 mg by mouth daily after supper.  10  ? ondansetron (ZOFRAN) 8 MG tablet TAKE 1 TABLET BY MOUTH EVERY 8 HOURS AS NEEDED FOR NAUSEA OR VOMITING. 18 tablet 1  ? temozolomide (TEMODAR) 140 MG capsule Take 2 capsules (280 mg total) by mouth daily. Take on days 1-7 and days 15-21 of each 28 day cycle. May take on an empty stomach or at bedtime to decrease nausea & vomiting. 28 capsule 3  ? ?No current  facility-administered medications for this visit.  ? ? ?SURGICAL HISTORY:  ?Past Surgical History:  ?Procedure Laterality Date  ? CHEST TUBE INSERTION Right 07/16/2018  ? Procedure: INSERTION PLEURAL DRAINAGE CATHETER, right;  Surgeon: Grace Isaac, MD;  Location: [facility];  Service: Thoracic;  Laterality: Right;  ? COLONOSCOPY  2017  ? [place]  ? GRAFTS  ? IR IMAGING GUIDED PORT INSERTION  10/02/2021  ? IR RADIOLOGIST EVAL & MGMT  10/08/2021  ? IR RADIOLOGIST EVAL & MGMT  10/11/2021  ? IR REMOVAL TUN ACCESS W/ PORT W/O FL MOD SED  10/15/2021  ? IR THORACENTESIS ASP PLEURAL SPACE W/IMG GUIDE  07/03/2018  ? ? ?REVIEW OF SYSTEMS:  A comprehensive review of systems was negative except for: Constitutional: positive for fatigue ?Eyes: positive for visual disturbance ?Hematologic/lymphatic: positive for petechiae  ? ?PHYSICAL EXAMINATION: General appearance: alert, cooperative, and no distress ?Head: Normocephalic, without obvious abnormality, atraumatic ?Neck: no adenopathy, no JVD, supple, symmetrical, trachea midline, and thyroid not enlarged, symmetric, no tenderness/mass/nodules ?Lymph nodes: Cervical, supraclavicular, and axillary nodes normal. ?Resp: clear to auscultation bilaterally ?Back: symmetric, no curvature. ROM normal. No CVA tenderness. ?Cardio: regular rate and rhythm, S1, S2 normal, no murmur, click, rub or gallop ?GI: soft, non-tender; bowel sounds normal; no masses,  no organomegaly ?Extremities: extremities normal, atraumatic, no cyanosis or edema ? ?ECOG PERFORMANCE STATUS: 1 - Symptomatic but completely ambulatory ? ?Blood pressure (!) 145/81, pulse 100, temperature (!) 97.5 ?F (36.4 ?C), temperature source Tympanic, resp. rate 16, height 5\' 6"  (1.676 m), weight 159 lb 8 oz (72.3 kg), SpO2 98 %. ? ?LABORATORY DATA: ?Lab Results  ?Component Value Date  ? WBC 4.9 11/27/2021  ? HGB 12.4 11/27/2021  ? HCT 37.9 11/27/2021  ? MCV 92.0 11/27/2021  ? PLT 195 11/27/2021  ? ? ?  Chemistry   ?    ?Component Value Date/Time  ? NA 141 11/27/2021 1356  ? K 4.2 11/27/2021 1356  ? CL 105 11/27/2021 1356  ? CO2 28 11/27/2021 1356  ? BUN 18 11/27/2021 1356  ? CREATININE 0.84 11/27/2021 1356  ?    ?Component Value Date/Time  ? CALCIUM 9.8 11/27/2021 1356  ? ALKPHOS 85 11/27/2021 1356  ? AST 35 11/27/2021 1356  ? ALT 26 11/27/2021 1356  ? BILITOT 0.5 11/27/2021 1356  ?  ? ? ? ?RADIOGRAPHIC STUDIES: ?No results found. ? ? ?ASSESSMENT AND PLAN: This is a very pleasant 61 years old white female presented with large right lower lobe lung mass in addition to right pleural effusion.  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 ?The patient was referred to Dr. Angelina Ok at [facility] for second opinion and she underwent several studies and intervention as listed below ?1) repeat biopsy on August 11, 2018 of the lung mass at [facility] and it showed atypical spindle cell proliferation. ?2) on September 23, 2018 she underwent bilateral transexternal thoracotomies for resection of the right pleural-based tumor with en bloc right lower lobe wedge resection and the pathology revealed 18 cm solitary fibrous tumor with negative margins. ?3) restaging CT scan of the chest on September 14, 2019 showed new 1.5 cm soft tissue lesion along the right subpulmonic diaphragmatic pleural surface worrisome for tumor recurrence.  She was followed by observation by Dr. Angelina Ok at [place] [facility] ?4) SBRT to the right lower lobe/diaphragmatic lesion completed June 22, 2020. ?5) restaging scan on November 30, 2020 showed new right posterior pleural-based nodule followed by observation.  But repeat CT scan of the chest on 02/07/2021 showed enlarging right pleural mass/nodules. ?6) the patient is started treatment with pazopanib on 02/17/2021 400 mg titrated to 800 mg p.o. daily but her treatment was held between July 8 to March 05, 2021 secondary to fatigue and  arthralgia.  She resumed her treatment at a dose of 400 mg p.o. daily for 3 days titrated to 600 mg p.o. daily but this was again held in mid August 2022.  She resumed her treatment on 04/23/2021 at 400 mg p.o. daily but

## 2021-12-13 ENCOUNTER — Other Ambulatory Visit (HOSPITAL_COMMUNITY): Payer: Self-pay

## 2021-12-14 ENCOUNTER — Other Ambulatory Visit: Payer: Self-pay

## 2021-12-14 ENCOUNTER — Telehealth: Payer: Self-pay | Admitting: Medical Oncology

## 2021-12-14 ENCOUNTER — Emergency Department (HOSPITAL_COMMUNITY)
Admission: EM | Admit: 2021-12-14 | Discharge: 2021-12-14 | Disposition: A | Discharge status: Home / Self Care | Payer: BC Managed Care – PPO | Attending: Emergency Medicine | Admitting: Emergency Medicine

## 2021-12-14 ENCOUNTER — Encounter (HOSPITAL_COMMUNITY): Payer: Self-pay | Admitting: *Deleted

## 2021-12-14 DIAGNOSIS — R040 Epistaxis: Secondary | ICD-10-CM | POA: Diagnosis not present

## 2021-12-14 DIAGNOSIS — R030 Elevated blood-pressure reading, without diagnosis of hypertension: Secondary | ICD-10-CM | POA: Insufficient documentation

## 2021-12-14 DIAGNOSIS — J45909 Unspecified asthma, uncomplicated: Secondary | ICD-10-CM | POA: Diagnosis not present

## 2021-12-14 DIAGNOSIS — D696 Thrombocytopenia, unspecified: Secondary | ICD-10-CM | POA: Insufficient documentation

## 2021-12-14 DIAGNOSIS — S79922A Unspecified injury of left thigh, initial encounter: Secondary | ICD-10-CM | POA: Diagnosis present

## 2021-12-14 DIAGNOSIS — W228XXA Striking against or struck by other objects, initial encounter: Secondary | ICD-10-CM | POA: Diagnosis not present

## 2021-12-14 DIAGNOSIS — S7012XA Contusion of left thigh, initial encounter: Secondary | ICD-10-CM | POA: Diagnosis not present

## 2021-12-14 DIAGNOSIS — Z7951 Long term (current) use of inhaled steroids: Secondary | ICD-10-CM | POA: Diagnosis not present

## 2021-12-14 DIAGNOSIS — R21 Rash and other nonspecific skin eruption: Secondary | ICD-10-CM | POA: Insufficient documentation

## 2021-12-14 LAB — COMPREHENSIVE METABOLIC PANEL
ALT: 20 U/L (ref 0–44)
AST: 23 U/L (ref 15–41)
Albumin: 4.5 g/dL (ref 3.5–5.0)
Alkaline Phosphatase: 89 U/L (ref 38–126)
Anion gap: 8 (ref 5–15)
BUN: 18 mg/dL (ref 6–20)
CO2: 28 mmol/L (ref 22–32)
Calcium: 9.8 mg/dL (ref 8.9–10.3)
Chloride: 105 mmol/L (ref 98–111)
Creatinine, Ser: 0.59 mg/dL (ref 0.44–1.00)
GFR, Estimated: >60 mL/min (ref >60)
Glucose, Bld: 98 mg/dL (ref 70–99)
Potassium: 3.7 mmol/L (ref 3.5–5.1)
Sodium: 141 mmol/L (ref 135–145)
Total Bilirubin: 0.8 mg/dL (ref 0.3–1.2)
Total Protein: 7.8 g/dL (ref 6.5–8.1)

## 2021-12-14 LAB — PROTIME-INR
INR: 0.9 (ref 0.8–1.2)
Prothrombin Time: 12.2 seconds (ref 11.4–15.2)

## 2021-12-14 LAB — ABO/RH
ABO/RH(D): O POS
ABO/RH(D): O POS

## 2021-12-14 LAB — APTT: aPTT: 24 seconds (ref 24–36)

## 2021-12-14 MED ORDER — SODIUM CHLORIDE 0.9 % IV SOLN
10.0000 mL/h | Freq: Once | INTRAVENOUS | Status: DC
Start: 2021-12-14 — End: 2021-12-14

## 2021-12-14 NOTE — ED Notes (Signed)
Electronic consent for blood products signed, in electronic record.  ?

## 2021-12-14 NOTE — Discharge Instructions (Addendum)
You were seen here today for evaluation of your nose bleed. Your lab work showed a worsening platelet count. You were given platelets here for replenishment. Your oncology team has requested that you stop taking your oral chemo for now until you can be re-evaluated in their office before your next appointment. I have attached more information on nosebleeds and thrombocytopenia to your chart. Again please follow up with your oncologist in 24 hours if they have not reached out to you by then.  ? ?Contact a doctor if: ?You have a fever. ?You get nosebleeds often. ?You get nosebleeds more often than usual. ?You bruise very easily. ?You have something stuck in your nose. ?You are bleeding in your mouth. ?You vomit or cough up brown material. ?You get a nosebleed after you start a new medicine. ?Get help right away if: ?You have a nosebleed after you fall or hurt your head. ?Your nosebleed does not go away after 20 minutes. ?You feel dizzy or weak. ?You have unusual bleeding from other parts of your body. ?You have unusual bruising on other parts of your body. ?You get sweaty. ?You vomit blood. ? ? ?Contact a doctor if: ?You have bruises and you do not know why. ?You have new symptoms. ?You have symptoms that get worse. ?You have a fever. ?Get help right away if: ?You have very bad bleeding anywhere on your body. ?You have blood in your vomit, pee, or poop. ?You have an injury to your head. ?You have a sudden, very bad headache. ?

## 2021-12-14 NOTE — ED Triage Notes (Signed)
Pt is a chemo t, plts low on Monday and could not get infusion, this morning about 7 am pt developed nosebleed that has been constant until a few minutes ago. ?

## 2021-12-14 NOTE — ED Provider Notes (Signed)
?[facility] ?Provider Note ? ? ?CSN: [id number] ?Arrival date & time: 12/14/21  1012 ? ?  ? ?History ?Chief Complaint  ?Patient presents with  ? Epistaxis  ? ? ?Brenda Patel is a 61 y.o. 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  Patient reports that she has had some intermittent nosebleeds for the past week, but nothing lasting this long.  She tried direct pressure but nothing was working.  When she presented to the ER, the bleeding stopped.  She is also noticed that she has had this red rash to her body that is not itching or painful.  She denies any fever.  She reports that she was seen a few days ago at the IV infusion center was unable to get her infusion because her platelets were 42.  Additionally, she was placed on steroids for a itchy rash she had earlier as well as her low platelets.  The patient reports she also has 2 bruises noted to her left thigh after hitting a file cabinet. ? ? ?Epistaxis ?Associated symptoms: no fever   ? ?  ? ?Home Medications ?Prior to Admission medications   ?Medication Sig Start Date End Date Taking? Authorizing Provider  ?Calcium Citrate 333 MG TABS Take 333 mg by mouth daily.    [provider]  ?[facility] 12 HOUR NASAL DECONGESTANT 120 MG 12 hr tablet Take 120 mg by mouth daily. 05/31/18   [provider]  ?fluticasone (FLONASE) 50 MCG/ACT nasal spray Place 1 spray into both nostrils daily as needed for allergies. 05/05/18   [provider]  ?meloxicam (MOBIC) 15 MG tablet Take 15 mg by mouth daily as needed for pain.  10/13/19   [provider]  ?methylPREDNISolone (MEDROL DOSEPAK) 4 MG TBPK tablet Use as instructed. 12/10/21   Curt Bears, MD  ?montelukast (SINGULAIR) 10 MG tablet Take 10 mg by mouth daily after supper. 06/07/18   [provider]  ?ondansetron  (ZOFRAN) 8 MG tablet TAKE 1 TABLET BY MOUTH EVERY 8 HOURS AS NEEDED FOR NAUSEA OR VOMITING. 11/05/21   Heilingoetter, Cassandra L, PA-C  ?temozolomide (TEMODAR) 140 MG capsule Take 2 capsules (280 mg total) by mouth daily. Take on days 1-7 and days 15-21 of each 28 day cycle. May take on an empty stomach or at bedtime to decrease nausea & vomiting. 10/02/21   Curt Bears, MD  ?   ? ?Allergies    ?Cyanoacrylate, Other, Codeine, and Lentil   ? ?Review of Systems   ?Review of Systems  ?Constitutional:  Negative for chills and fever.  ?HENT:  Positive for nosebleeds.   ?Respiratory:  Negative for shortness of breath.   ?Cardiovascular:  Negative for chest pain.  ?Neurological:  Negative for light-headedness.  ?Psychiatric/Behavioral:  Negative for confusion.   ? ?Physical Exam ?Updated Vital Signs ?BP (!) 143/83 (BP Location: Left Arm)   Pulse 98   Temp 97.7 ?F (36.5 ?C)   Resp 16   Ht 5\' 6"  (1.676 m)   Wt 71.7 kg   SpO2 100%   BMI 25.50 kg/m?  ?Physical Exam ?Vitals and nursing note reviewed.  ?Constitutional:   ?   General: She is not in acute distress. ?   Appearance: Normal appearance. She is not ill-appearing or toxic-appearing.  ?HENT:  ?   Nose:  ?   Comments: Dried blood noted to the left  nare. ?   Mouth/Throat:  ?   Mouth: Mucous membranes are moist.  ?Eyes:  ?   General: No scleral icterus. ?Cardiovascular:  ?   Rate and Rhythm: Normal rate and regular rhythm.  ?Pulmonary:  ?   Effort: Pulmonary effort is normal. No respiratory distress.  ?Abdominal:  ?   Tenderness: There is no abdominal tenderness. There is no guarding or rebound.  ?Skin: ?   General: Skin is dry.  ?   Findings: Rash present.  ?   Comments: Diffuse small petechial rash noted to the legs, arms, chest, abdomen, and back.  Nonpainful, no skin sloughing, not itching. Two quarter sized soft bruises noted to the lateral left thigh.   ?Neurological:  ?   General: No focal deficit present.  ?   Mental Status: She is alert. Mental status is  at baseline.  ?Psychiatric:     ?   Mood and Affect: Mood normal.  ? ? ?ED Results / Procedures / Treatments   ?Labs ?(all labs ordered are listed, but only abnormal results are displayed) ?Labs Reviewed  ?CBC WITH DIFFERENTIAL/PLATELET - Abnormal; Notable for the following components:  ?    Result Value  ? RDW 17.1 (*)   ? Platelets 18 (*)   ? Lymphs Abs 0.2 (*)   ? All other components within normal limits  ?PROTIME-INR  ?APTT  ?COMPREHENSIVE METABOLIC PANEL  ?PREPARE PLATELET PHERESIS  ?ABO/RH  ?ABO/RH  ? ? ?EKG ?None ? ?Radiology ?No results found. ? ?Procedures ?Procedures  ? ?Medications Ordered in ED ?Medications  ?0.9 %  sodium chloride infusion (0 mL/hr Intravenous Hold 12/14/21 1457)  ? ? ?ED Course/ Medical Decision Making/ A&P ?  ?                        ?Medical Decision Making ?Amount and/or Complexity of Data Reviewed ?Labs: ordered. ? ?Risk ?Prescription drug management. ? ? ?61 year old female on chemo and IV infusions for sarcomatoid lung cancer presents emergency department for evaluation of nosebleed and petechial rash.  Differential diagnosis includes was not limited to low platelets, posterior nosebleed, TTP, ITP, medication adverse effects.  Vital signs show mild elevated blood pressure 151/88, afebrile, normal pulse rate, satting well room air without any increased work of breathing.  Physical exam is pertinent for diffuse small petechial rash noted to the legs, arms, chest, abdomen, and back.  Nonpainful, no skin sloughing, not itching. Two quarter sized soft bruises noted to the lateral left thigh. Dried blood noted to the left nare.  ? ?I independently reviewed and interpreted the patient's labs.  CBC shows no leukocytosis or anemia.  Patient has significant thrombocytopenia with a platelet count of 18.  CMP shows no electrolyte abnormality or LFT abnormality.  Normal PTT and PT/INR. ? ?Given the significant thrombocytopenia, consult placed to oncology.  Spoke to Dr. Lindi Adie about the case.  He requests one unit of platelets and the patient can be discharged home. He recommends following up with oncology for recheck.  ? ?I discussed possible risks versus benefits of platelet transfusion.  She accepts and gives consent. ? ?Platelets are transfusing currently.  ? ?3:20 PM Care of Brenda Patel transferred to [place] at the end of my shift as the patient will require reassessment once labs/imaging have resulted. Patient presentation, ED course, and plan of care discussed with review of all pertinent labs and imaging. Please see his/her note for further details regarding further ED course  and disposition. Plan at time of handoff is awaiting platelet transfusion completion. This may be altered or completely changed at the discretion of the oncoming team pending results of further workup. ? ? ? ?Final Clinical Impression(s) / ED Diagnoses ?Final diagnoses:  ?Thrombocytopenia ([place])  ?Epistaxis  ? ? ?Rx / DC Orders ?ED Discharge Orders   ? ? None  ? ?  ? ? ?  ?Sherrell Puller, PA-C ?12/14/21 1521 ? ?  ?Blanchie Dessert, MD ?12/14/21 1531 ? ?

## 2021-12-14 NOTE — ED Provider Triage Note (Addendum)
Emergency Medicine Provider Triage Evaluation Note ? ?Brenda Patel , a 61 y.o. female  was evaluated in triage.  Pt complains of nosebleed for the past 3.5 hours that has since stopped. She reports she has had shorter nosebleeds for the past week but not this bad. She is a cancer patient that is receiving infusions and chemo. She reports that her platelet count was 42 and she was not able to receive her infusion. She also reports full body petechiae. She reports a large bruise on her left leg from running into a file cabinet as well.  ? ?Review of Systems  ?Positive:  ?Negative:  ? ?Physical Exam  ?BP (!) 160/94 (BP Location: Left Arm)   Pulse 96   Temp 98 ?F (36.7 ?C) (Oral)   Resp 18   Ht 5\' 6"  (1.676 m)   Wt 71.7 kg   SpO2 100%   BMI 25.50 kg/m?  ?Gen:   Awake, no distress   ?Resp:  Normal effort  ?MSK:   Moves extremities without difficulty  ?Other:  Some dried blood noted to the left nare.  ? ?Medical Decision Making  ?Medically screening exam initiated at 10:35 AM.  Appropriate orders placed.  BEULAH CAPOBIANCO was informed that the remainder of the evaluation will be completed by another provider, this initial triage assessment does not replace that evaluation, and the importance of remaining in the ED until their evaluation is complete. ? ?Will order basic labs to recheck platelets.  ?  ?Sherrell Puller, PA-C ?12/14/21 1038 ? ?  ?Sherrell Puller, PA-C ?12/14/21 1040 ? ?

## 2021-12-14 NOTE — Telephone Encounter (Signed)
Nose bleed for past 2 hours- ? ?She has tried ice on her neck , leaning forward and pinching nose. ?Pt states she wiped her nose this am an removed a clot and then the bleeding started. ? ?04/17-plts 42 k and treatment held. ? ?Per Cassie , I instructed pt to go to [facility] ED and not [facility] because [facility] does not administer blood products. She voiced understanding. ?

## 2021-12-15 LAB — BPAM PLATELET PHERESIS
Blood Product Expiration Date: 202304232359
ISSUE DATE / TIME: 202304211442
Unit Type and Rh: 6200

## 2021-12-15 LAB — PREPARE PLATELET PHERESIS: Unit division: 0

## 2021-12-16 ENCOUNTER — Other Ambulatory Visit: Payer: Self-pay | Admitting: Internal Medicine

## 2021-12-16 DIAGNOSIS — C3491 Malignant neoplasm of unspecified part of right bronchus or lung: Secondary | ICD-10-CM

## 2021-12-17 ENCOUNTER — Telehealth: Payer: Self-pay | Admitting: Medical Oncology

## 2021-12-17 ENCOUNTER — Telehealth: Payer: Self-pay | Admitting: Internal Medicine

## 2021-12-17 LAB — CBC WITH DIFFERENTIAL/PLATELET
Abs Immature Granulocytes: 0.02 10*3/uL (ref 0.00–0.07)
Basophils Absolute: 0 10*3/uL (ref 0.0–0.1)
Basophils Relative: 0 %
Eosinophils Absolute: 0.3 10*3/uL (ref 0.0–0.5)
Eosinophils Relative: 3 %
HCT: 38.8 % (ref 36.0–46.0)
Hemoglobin: 12.8 g/dL (ref 12.0–15.0)
Immature Granulocytes: 0 %
Lymphocytes Relative: 3 %
Lymphs Abs: 0.2 10*3/uL — ABNORMAL LOW (ref 0.7–4.0)
MCH: 31.1 pg (ref 26.0–34.0)
MCHC: 33 g/dL (ref 30.0–36.0)
MCV: 94.4 fL (ref 80.0–100.0)
Monocytes Absolute: 0.8 10*3/uL (ref 0.1–1.0)
Monocytes Relative: 9 %
Neutro Abs: 6.8 10*3/uL (ref 1.7–7.7)
Neutrophils Relative %: 85 %
Platelets: 18 10*3/uL — CL (ref 150–400)
RBC: 4.11 MIL/uL (ref 3.87–5.11)
RDW: 17.1 % — ABNORMAL HIGH (ref 11.5–15.5)
WBC: 8.1 10*3/uL (ref 4.0–10.5)
nRBC: 0 % (ref 0.0–0.2)

## 2021-12-17 NOTE — Telephone Encounter (Signed)
F/U from ED nosebleed /low plts-.  ?Last dose of temodar was   4/10-16th-.  ? ?Nosebleeds resolved. ? ?Itching is better  after taking steroids.She did not take last 2 days of steroids . ? ?Lab appt Wednesday. ?Does she need a provider visit after the lab? ? ?May 1 st-She is scheduled for lab , provider and infusion.  ? ?May 4th Dr Caleen Jobs appt  ?

## 2021-12-17 NOTE — Telephone Encounter (Signed)
Pt notified that she just had lab appt on wed . Keep other appts as scheduled. ?

## 2021-12-17 NOTE — Telephone Encounter (Signed)
Scheduled appointment per inbasket message. Patient aware.   ?

## 2021-12-19 ENCOUNTER — Inpatient Hospital Stay: Payer: BC Managed Care – PPO

## 2021-12-19 ENCOUNTER — Other Ambulatory Visit (HOSPITAL_COMMUNITY): Payer: Self-pay

## 2021-12-19 ENCOUNTER — Telehealth: Payer: Self-pay

## 2021-12-19 ENCOUNTER — Other Ambulatory Visit: Payer: Self-pay

## 2021-12-19 ENCOUNTER — Inpatient Hospital Stay (HOSPITAL_BASED_OUTPATIENT_CLINIC_OR_DEPARTMENT_OTHER): Payer: BC Managed Care – PPO | Admitting: Physician Assistant

## 2021-12-19 ENCOUNTER — Other Ambulatory Visit: Payer: Self-pay | Admitting: Physician Assistant

## 2021-12-19 ENCOUNTER — Encounter (HOSPITAL_COMMUNITY): Payer: Self-pay | Admitting: Pharmacy Technician

## 2021-12-19 ENCOUNTER — Inpatient Hospital Stay (HOSPITAL_COMMUNITY)
Admission: EM | Admit: 2021-12-19 | Discharge: 2021-12-21 | DRG: 812 | Disposition: A | Discharge status: Home / Self Care | Payer: BC Managed Care – PPO | Source: Ambulatory Visit | Attending: Internal Medicine | Admitting: Internal Medicine

## 2021-12-19 VITALS — BP 154/78 | HR 108 | Temp 97.8°F | Resp 16

## 2021-12-19 DIAGNOSIS — T7840XS Allergy, unspecified, sequela: Secondary | ICD-10-CM

## 2021-12-19 DIAGNOSIS — C3431 Malignant neoplasm of lower lobe, right bronchus or lung: Secondary | ICD-10-CM | POA: Diagnosis present

## 2021-12-19 DIAGNOSIS — Z791 Long term (current) use of non-steroidal anti-inflammatories (NSAID): Secondary | ICD-10-CM

## 2021-12-19 DIAGNOSIS — T451X5A Adverse effect of antineoplastic and immunosuppressive drugs, initial encounter: Secondary | ICD-10-CM | POA: Diagnosis present

## 2021-12-19 DIAGNOSIS — R040 Epistaxis: Secondary | ICD-10-CM | POA: Diagnosis present

## 2021-12-19 DIAGNOSIS — J45909 Unspecified asthma, uncomplicated: Secondary | ICD-10-CM

## 2021-12-19 DIAGNOSIS — C3491 Malignant neoplasm of unspecified part of right bronchus or lung: Secondary | ICD-10-CM | POA: Diagnosis not present

## 2021-12-19 DIAGNOSIS — R739 Hyperglycemia, unspecified: Secondary | ICD-10-CM

## 2021-12-19 DIAGNOSIS — D696 Thrombocytopenia, unspecified: Secondary | ICD-10-CM

## 2021-12-19 DIAGNOSIS — Z888 Allergy status to other drugs, medicaments and biological substances status: Secondary | ICD-10-CM

## 2021-12-19 DIAGNOSIS — R21 Rash and other nonspecific skin eruption: Secondary | ICD-10-CM | POA: Diagnosis present

## 2021-12-19 DIAGNOSIS — Z91018 Allergy to other foods: Secondary | ICD-10-CM

## 2021-12-19 DIAGNOSIS — T7840XA Allergy, unspecified, initial encounter: Secondary | ICD-10-CM

## 2021-12-19 DIAGNOSIS — L299 Pruritus, unspecified: Secondary | ICD-10-CM | POA: Diagnosis present

## 2021-12-19 DIAGNOSIS — T8092XA Unspecified transfusion reaction, initial encounter: Principal | ICD-10-CM | POA: Diagnosis present

## 2021-12-19 DIAGNOSIS — D6959 Other secondary thrombocytopenia: Secondary | ICD-10-CM | POA: Diagnosis present

## 2021-12-19 DIAGNOSIS — Z885 Allergy status to narcotic agent status: Secondary | ICD-10-CM

## 2021-12-19 DIAGNOSIS — D72819 Decreased white blood cell count, unspecified: Secondary | ICD-10-CM | POA: Diagnosis present

## 2021-12-19 DIAGNOSIS — Z79899 Other long term (current) drug therapy: Secondary | ICD-10-CM

## 2021-12-19 LAB — CBC WITH DIFFERENTIAL (CANCER CENTER ONLY)
Abs Immature Granulocytes: 0 10*3/uL (ref 0.00–0.07)
Basophils Absolute: 0 10*3/uL (ref 0.0–0.1)
Basophils Relative: 0 %
Eosinophils Absolute: 0.5 10*3/uL (ref 0.0–0.5)
Eosinophils Relative: 12 %
HCT: 34.5 % — ABNORMAL LOW (ref 36.0–46.0)
Hemoglobin: 11.4 g/dL — ABNORMAL LOW (ref 12.0–15.0)
Immature Granulocytes: 0 %
Lymphocytes Relative: 6 %
Lymphs Abs: 0.3 10*3/uL — ABNORMAL LOW (ref 0.7–4.0)
MCH: 31 pg (ref 26.0–34.0)
MCHC: 33 g/dL (ref 30.0–36.0)
MCV: 93.8 fL (ref 80.0–100.0)
Monocytes Absolute: 0.4 10*3/uL (ref 0.1–1.0)
Monocytes Relative: 8 %
Neutro Abs: 3.3 10*3/uL (ref 1.7–7.7)
Neutrophils Relative %: 74 %
Platelet Count: <5 10*3/uL — CL (ref 150–400)
RBC: 3.68 MIL/uL — ABNORMAL LOW (ref 3.87–5.11)
RDW: 17.3 % — ABNORMAL HIGH (ref 11.5–15.5)
Smear Review: NORMAL
WBC Count: 4.5 10*3/uL (ref 4.0–10.5)
nRBC: 0 % (ref 0.0–0.2)

## 2021-12-19 LAB — TYPE AND SCREEN
ABO/RH(D): O POS
Antibody Screen: NEGATIVE

## 2021-12-19 LAB — CBC WITH DIFFERENTIAL/PLATELET
Abs Immature Granulocytes: 0.02 10*3/uL (ref 0.00–0.07)
Basophils Absolute: 0 10*3/uL (ref 0.0–0.1)
Basophils Relative: 0 %
Eosinophils Absolute: 0 10*3/uL (ref 0.0–0.5)
Eosinophils Relative: 0 %
HCT: 32.8 % — ABNORMAL LOW (ref 36.0–46.0)
Hemoglobin: 11.2 g/dL — ABNORMAL LOW (ref 12.0–15.0)
Immature Granulocytes: 0 %
Lymphocytes Relative: 4 %
Lymphs Abs: 0.2 10*3/uL — ABNORMAL LOW (ref 0.7–4.0)
MCH: 32.3 pg (ref 26.0–34.0)
MCHC: 34.1 g/dL (ref 30.0–36.0)
MCV: 94.5 fL (ref 80.0–100.0)
Monocytes Absolute: 0.1 10*3/uL (ref 0.1–1.0)
Monocytes Relative: 1 %
Neutro Abs: 5.8 10*3/uL (ref 1.7–7.7)
Neutrophils Relative %: 95 %
Platelets: 9 10*3/uL — CL (ref 150–400)
RBC: 3.47 MIL/uL — ABNORMAL LOW (ref 3.87–5.11)
RDW: 17.4 % — ABNORMAL HIGH (ref 11.5–15.5)
Smear Review: DECREASED
WBC: 6.2 10*3/uL (ref 4.0–10.5)
nRBC: 0 % (ref 0.0–0.2)

## 2021-12-19 LAB — COMPREHENSIVE METABOLIC PANEL
ALT: 22 U/L (ref 0–44)
AST: 26 U/L (ref 15–41)
Albumin: 3.6 g/dL (ref 3.5–5.0)
Alkaline Phosphatase: 85 U/L (ref 38–126)
Anion gap: 6 (ref 5–15)
BUN: 15 mg/dL (ref 6–20)
CO2: 22 mmol/L (ref 22–32)
Calcium: 8.3 mg/dL — ABNORMAL LOW (ref 8.9–10.3)
Chloride: 112 mmol/L — ABNORMAL HIGH (ref 98–111)
Creatinine, Ser: 0.73 mg/dL (ref 0.44–1.00)
GFR, Estimated: >60 mL/min (ref >60)
Glucose, Bld: 202 mg/dL — ABNORMAL HIGH (ref 70–99)
Potassium: 4 mmol/L (ref 3.5–5.1)
Sodium: 140 mmol/L (ref 135–145)
Total Bilirubin: 0.6 mg/dL (ref 0.3–1.2)
Total Protein: 6.6 g/dL (ref 6.5–8.1)

## 2021-12-19 LAB — PROTIME-INR
INR: 1 (ref 0.8–1.2)
Prothrombin Time: 13.2 seconds (ref 11.4–15.2)

## 2021-12-19 LAB — ABO/RH: ABO/RH(D): O POS

## 2021-12-19 MED ORDER — METHYLPREDNISOLONE SODIUM SUCC 125 MG IJ SOLR
125.0000 mg | Freq: Once | INTRAMUSCULAR | Status: AC
  Administered 2021-12-19: 125 mg via INTRAVENOUS

## 2021-12-19 MED ORDER — ACETAMINOPHEN 325 MG PO TABS
650.0000 mg | ORAL_TABLET | Freq: Four times a day (QID) | ORAL | Status: DC | PRN
  Administered 2021-12-20: 650 mg via ORAL
  Filled 2021-12-19: qty 2

## 2021-12-19 MED ORDER — SODIUM CHLORIDE 0.9 % IV SOLN: Freq: Once | INTRAVENOUS | Status: AC

## 2021-12-19 MED ORDER — ALBUTEROL SULFATE (2.5 MG/3ML) 0.083% IN NEBU: 2.5000 mg | INHALATION_SOLUTION | RESPIRATORY_TRACT | Status: DC | PRN

## 2021-12-19 MED ORDER — DIPHENHYDRAMINE HCL 50 MG/ML IJ SOLN
50.0000 mg | Freq: Once | INTRAMUSCULAR | Status: AC
  Administered 2021-12-19: 50 mg via INTRAVENOUS

## 2021-12-19 MED ORDER — OXYMETAZOLINE HCL 0.05 % NA SOLN
1.0000 | Freq: Two times a day (BID) | NASAL | Status: AC
  Administered 2021-12-19 – 2021-12-20 (×2): 1 via NASAL
  Filled 2021-12-19: qty 15

## 2021-12-19 MED ORDER — DIPHENHYDRAMINE HCL 50 MG/ML IJ SOLN
25.0000 mg | Freq: Once | INTRAMUSCULAR | Status: AC
  Administered 2021-12-19: 25 mg via INTRAVENOUS

## 2021-12-19 MED ORDER — FAMOTIDINE IN NACL 20-0.9 MG/50ML-% IV SOLN
20.0000 mg | Freq: Once | INTRAVENOUS | Status: AC
  Administered 2021-12-19: 20 mg via INTRAVENOUS

## 2021-12-19 MED ORDER — CHLORHEXIDINE GLUCONATE CLOTH 2 % EX PADS
6.0000 | MEDICATED_PAD | Freq: Every day | CUTANEOUS | Status: DC
  Administered 2021-12-20: 6 via TOPICAL

## 2021-12-19 MED ORDER — SODIUM CHLORIDE 0.9% IV SOLUTION
250.0000 mL | Freq: Once | INTRAVENOUS | Status: AC
  Administered 2021-12-19: 250 mL via INTRAVENOUS

## 2021-12-19 MED ORDER — METHYLPREDNISOLONE SODIUM SUCC 125 MG IJ SOLR
125.0000 mg | Freq: Once | INTRAMUSCULAR | Status: AC
  Administered 2021-12-19: 125 mg via INTRAVENOUS
  Filled 2021-12-19: qty 2

## 2021-12-19 MED ORDER — MONTELUKAST SODIUM 10 MG PO TABS
10.0000 mg | ORAL_TABLET | Freq: Every day | ORAL | Status: DC
  Administered 2021-12-20: 10 mg via ORAL
  Filled 2021-12-19: qty 1

## 2021-12-19 MED ORDER — ACETAMINOPHEN 650 MG RE SUPP: 650.0000 mg | Freq: Four times a day (QID) | RECTAL | Status: DC | PRN

## 2021-12-19 NOTE — Telephone Encounter (Signed)
CRITICAL VALUE STICKER ? ?CRITICAL VALUE: Platelets < 5 ? ?RECEIVER (on-site recipient of call): Yetta Glassman, CMA ? ?DATE & TIME NOTIFIED: 12/19/21 9:50am ? ?MESSENGER (representative from lab): ? ?MD NOTIFIED: Dr. Deanna Artis PA-C ? ?TIME OF NOTIFICATION: 12/19/21 at 9:51am ? ?RESPONSE: Pt was scheduled to receive platelet infusion today. ? ?

## 2021-12-19 NOTE — Progress Notes (Signed)
Patient had nose bleed just prior to admission. Therefore, we are not swabbing for MRSA at this time. ?

## 2021-12-19 NOTE — Progress Notes (Deleted)
[facility] OFFICE PROGRESS NOTE  [address] [place] [address]  DIAGNOSIS: Malignant neoplasm with sarcomatoid features presented as large right lower lobe lung mass with recurrent right pleural effusion diagnosed in December 2019.  PRIOR THERAPY: 1) Status post right Pleurx catheter placement for drainage of recurrent right pleural effusion. 2) repeat biopsy on August 11, 2018 of the lung mass at [facility] and it showed atypical spindle cell proliferation. 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 4) restaging CT scan of the chest on September 14, 2019 showed new 1.5 cm soft tissue lesion along the right subpulmonic diaphragmatic pleural surface worrisome for tumor recurrence.  She was followed by observation by Dr. Angelina Ok at [place] [facility] 5) SBRT to the right lower lobe/diaphragmatic lesion completed June 22, 2020. 6) restaging scan on November 30, 2020 showed new right posterior pleural-based nodule followed by observation.  But repeat CT scan of the chest on 02/07/2021 showed enlarging right pleural mass/nodules. 7) the patient is started treatment with pazopanib on 02/17/2021 400 mg titrated to 800 mg p.o. daily but her treatment was held between July 8 to March 05, 2021 secondary to fatigue and arthralgia.  She resumed her treatment at a dose of 400 mg p.o. daily for 3 days titrated to 600 mg p.o. daily but this was again held in mid August 2022.  She resumed her treatment on 04/23/2021 at 400 mg p.o. daily but this was discontinued in October 2022 secondary to disease progression was a scan on 05/30/2021 revealing progressive multifocal pleural-based disease.    CURRENT THERAPY:  Avastin and Temodar as  recommended by Dr. Angelina Ok at [place] center. First dose of temodar expected on 10/30/21. Temodar days 1-7, and 15-21 p.o. every 4 weeks with IV avastin 5 mg/kg on days 8 and 22 every 4 weeks. She missed day 8 cycle #1 due to issues with her port-a-cath. Status post 2 cycles   INTERVAL HISTORY: Brenda Patel 61 y.o. female returns to the clinic today for a follow up visit. She was last seen by Dr. Julien Nordmann on 12/10/21. She was having new onset itchiness/petechiae at that time. Her labs showed new onset thrombocytopenia with a platelet count of 42k at that time. It was thought this is secondary to temodar. Her treatment was held that day. Dr. Julien Nordmann prescribed a medrol dose pack.   She called a few days later on 4/21 endorsing a epistaxis not amenable to any interventions and lasting several hours. She was instructed to go to the ER where her platelet count was 18k. She received 1 unit of platelets.   She was seen on 12/19/21 for lab recheck. Her platelet count was <5 k. She was undergoing a platelet transfusion when she had an infusion reaction. She was subsequently sent to the ER.   She is here for lab recheck today. Her temodar has been on hold since ***. The patient is feeling well today except for petechiae and easy bruising. She denies anymore bleeding. She is here for evaluation and repeat blood work.   MEDICAL HISTORY: Past Medical History:  Diagnosis Date   Asthma    Cancer ([place])    Depression    Dyspnea    Pleural effusion on right  Pleural mass     ALLERGIES:  is allergic to cyanoacrylate, other, codeine, and lentil.  MEDICATIONS:  Current Outpatient Medications  Medication Sig Dispense Refill   Calcium Citrate 333 MG TABS Take 333 mg by mouth daily.     CVS 12 HOUR NASAL DECONGESTANT 120 MG 12 hr tablet Take 120 mg by mouth daily.  10   fluticasone (FLONASE) 50 MCG/ACT nasal spray Place 1 spray into both nostrils daily as needed for allergies.  5   meloxicam  (MOBIC) 15 MG tablet Take 15 mg by mouth daily as needed for pain.      methylPREDNISolone (MEDROL DOSEPAK) 4 MG TBPK tablet Use as instructed. 21 tablet 0   montelukast (SINGULAIR) 10 MG tablet Take 10 mg by mouth daily after supper.  10   ondansetron (ZOFRAN) 8 MG tablet TAKE 1 TABLET BY MOUTH EVERY 8 HOURS AS NEEDED FOR NAUSEA OR VOMITING. 18 tablet 1   temozolomide (TEMODAR) 140 MG capsule Take 2 capsules (280 mg total) by mouth daily. Take on days 1-7 and days 15-21 of each 28 day cycle. May take on an empty stomach or at bedtime to decrease nausea & vomiting. 28 capsule 3   No current facility-administered medications for this visit.   Facility-Administered Medications Ordered in Other Visits  Medication Dose Route Frequency Provider Last Rate Last Admin   0.9 %  sodium chloride infusion   Intravenous Once Walisiewicz, Kaitlyn E, PA-C       diphenhydrAMINE (BENADRYL) injection 50 mg  50 mg Intravenous Once Walisiewicz, Kaitlyn E, PA-C       famotidine (PEPCID) IVPB 20 mg premix  20 mg Intravenous Once Walisiewicz, Kaitlyn E, PA-C       methylPREDNISolone sodium succinate (SOLU-MEDROL) 125 mg/2 mL injection 125 mg  125 mg Intravenous Once Barrie Folk, PA-C        SURGICAL HISTORY:  Past Surgical History:  Procedure Laterality Date   CHEST TUBE INSERTION Right 07/16/2018   Procedure: INSERTION PLEURAL DRAINAGE CATHETER, right;  Surgeon: Grace Isaac, MD;  Location: [facility];  Service: Thoracic;  Laterality: Right;   COLONOSCOPY  2017   GUM SURGERY  1984   GRAFTS   IR IMAGING GUIDED PORT INSERTION  10/02/2021   IR RADIOLOGIST EVAL & MGMT  10/08/2021   IR RADIOLOGIST EVAL & MGMT  10/11/2021   IR REMOVAL TUN ACCESS W/ PORT W/O FL MOD SED  10/15/2021   IR THORACENTESIS ASP PLEURAL SPACE W/IMG GUIDE  07/03/2018    REVIEW OF SYSTEMS:   Review of Systems  Constitutional: Negative for appetite change, chills, fatigue, fever and unexpected weight change.  HENT:   Negative for  mouth sores, nosebleeds, sore throat and trouble swallowing.   Eyes: Negative for eye problems and icterus.  Respiratory: Negative for cough, hemoptysis, shortness of breath and wheezing.   Cardiovascular: Negative for chest pain and leg swelling.  Gastrointestinal: Negative for abdominal pain, constipation, diarrhea, nausea and vomiting.  Genitourinary: Negative for bladder incontinence, difficulty urinating, dysuria, frequency and hematuria.   Musculoskeletal: Negative for back pain, gait problem, neck pain and neck stiffness.  Skin: Negative for itching and rash.  Neurological: Negative for dizziness, extremity weakness, gait problem, headaches, light-headedness and seizures.  Hematological: Negative for adenopathy. Does not bruise/bleed easily.  Psychiatric/Behavioral: Negative for confusion, depression and sleep disturbance. The patient is not nervous/anxious.     PHYSICAL EXAMINATION:  There were no vitals taken for this visit.  ECOG PERFORMANCE STATUS: {CHL ONC ECOG HU:[id number]}  Physical Exam  Constitutional: Oriented to person, place, and time and well-developed, well-nourished, and in no distress. No distress.  HENT:  Head: Normocephalic and atraumatic.  Mouth/Throat: Oropharynx is clear and moist. No oropharyngeal exudate.  Eyes: Conjunctivae are normal. Right eye exhibits no discharge. Left eye exhibits no discharge. No scleral icterus.  Neck: Normal range of motion. Neck supple.  Cardiovascular: Normal rate, regular rhythm, normal heart sounds and intact distal pulses.   Pulmonary/Chest: Effort normal and breath sounds normal. No respiratory distress. No wheezes. No rales.  Abdominal: Soft. Bowel sounds are normal. Exhibits no distension and no mass. There is no tenderness.  Musculoskeletal: Normal range of motion. Exhibits no edema.  Lymphadenopathy:    No cervical adenopathy.  Neurological: Alert and oriented to person, place, and time. Exhibits normal muscle tone.  Gait normal. Coordination normal.  Skin: Skin is warm and dry. No rash noted. Not diaphoretic. No erythema. No pallor.  Psychiatric: Mood, memory and judgment normal.  Vitals reviewed.  LABORATORY DATA: Lab Results  Component Value Date   WBC 4.5 12/19/2021   HGB 11.4 (L) 12/19/2021   HCT 34.5 (L) 12/19/2021   MCV 93.8 12/19/2021   PLT <5 (LL) 12/19/2021      Chemistry      Component Value Date/Time   NA 141 12/14/2021 1101   K 3.7 12/14/2021 1101   CL 105 12/14/2021 1101   CO2 28 12/14/2021 1101   BUN 18 12/14/2021 1101   CREATININE 0.59 12/14/2021 1101   CREATININE 0.83 12/10/2021 1328      Component Value Date/Time   CALCIUM 9.8 12/14/2021 1101   ALKPHOS 89 12/14/2021 1101   AST 23 12/14/2021 1101   AST 24 12/10/2021 1328   ALT 20 12/14/2021 1101   ALT 22 12/10/2021 1328   BILITOT 0.8 12/14/2021 1101   BILITOT 0.5 12/10/2021 1328       RADIOGRAPHIC STUDIES:  No results found.   ASSESSMENT/PLAN:  This is a very pleasant 61 years old Caucasian female presented with large right lower lobe lung mass in addition to right pleural effusion.  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 The patient was referred to Dr. Angelina Ok at [facility] for second opinion and she underwent several studies and intervention as listed below  1) repeat biopsy on August 11, 2018 of the lung mass at [facility] and it showed atypical spindle cell proliferation. 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 3) restaging CT scan of the chest on September 14, 2019 showed new 1.5 cm soft tissue lesion along the right subpulmonic diaphragmatic pleural surface worrisome for tumor recurrence.  She was followed by observation by Dr.  Angelina Ok at [place] [facility] 4) SBRT to the right lower lobe/diaphragmatic lesion completed June 22, 2020. 5) restaging scan on November 30, 2020 showed new right posterior pleural-based nodule followed by observation.  But repeat CT scan of the chest on 02/07/2021 showed enlarging right pleural mass/nodules. 6) the patient is started treatment with pazopanib on 02/17/2021 400 mg titrated to 800 mg p.o. daily but her treatment was held between July 8 to March 05, 2021 secondary to fatigue and arthralgia.  She resumed her treatment at a dose of 400 mg p.o. daily for 3 days titrated to 600 mg p.o.  daily but this was again held in mid August 2022.  She resumed her treatment on 04/23/2021 at 400 mg p.o. daily but this was discontinued in October 2022 secondary to disease progression was a scan on 05/30/2021 revealing progressive multifocal pleural-based disease.. There was also a scan on 09/20/21 which showed progressive multifocal pleural based disease.   Dr. Angelina Ok recommended she consider treatment with Avastin and Temodar. She requested delaying starting treatment until after the holidays. She is on Avastin 5 mg/kg milligrams on days 8 and 22 every 4 weeks with Temodar p.o on days 1-7 and 15-21 every 4 weeks. She is status post 2 cycles.   Repeat scan***  She has been having significant thrombocytopenia requiring platelet transfusions.   Labs were reviewed which show ***. Recommend that she *** Premeds ***  Scans***.   The patient was advised to call immediately if she has any concerning symptoms in the interval. The patient voices understanding of current disease status and treatment options and is in agreement with the current care plan. All questions were answered. The patient knows to call the clinic with any problems, questions or concerns. We can certainly see the patient much sooner if necessary       No orders of the defined types were placed in this encounter.    I spent {CHL  ONC TIME VISIT - MBPJP:[id number]} counseling the patient face to face. The total time spent in the appointment was {CHL ONC TIME VISIT - HKUVJ:[id number]}.  Tavita Eastham L Noha Milberger, PA-C 12/19/21

## 2021-12-19 NOTE — ED Provider Notes (Signed)
?[facility] DEPT ?Provider Note ? ? ?CSN: [id number] ?Arrival date & time: 12/19/21  1639 ? ?  ? ?History ? ?Chief Complaint  ?Patient presents with  ? Allergic Reaction  ? ? ?Brenda Patel is a 61 y.o. female. ? ?61 year old female with prior medical history as detailed below presents from oncology clinic.  Patient was getting transfusion for thrombocytopenia. ? ?With platelet transfusion patient had reaction.  Patient was treated with both antihistamines and Solu-Medrol.  Patient's reaction was primarily itchy rash throughout her entire body. ? ?Patient was transported to the ED for admission for treatment of transfusion reaction and plan to transfuse platelets in the hospital in a more controlled setting. ? ?The history is provided by the patient and medical records.  ?Illness ?Location:  Transfusion reaction status post platelet transfusion ?Severity:  Moderate ?Onset quality:  Sudden ?Duration:  1 day ?Timing:  Constant ?Progression:  Unchanged ?Chronicity:  New ? ?  ? ?Home Medications ?Prior to Admission medications   ?Medication Sig Start Date End Date Taking? Authorizing Provider  ?Calcium Citrate 333 MG TABS Take 333 mg by mouth daily.   Yes [provider]  ?[facility] 12 HOUR NASAL DECONGESTANT 120 MG 12 hr tablet Take 120 mg by mouth daily. 05/31/18  Yes [provider]  ?fluticasone (FLONASE) 50 MCG/ACT nasal spray Place 1 spray into both nostrils daily as needed for allergies. 05/05/18  Yes [provider]  ?meloxicam (MOBIC) 15 MG tablet Take 15 mg by mouth daily as needed for pain.  10/13/19  Yes [provider]  ?montelukast (SINGULAIR) 10 MG tablet Take 10 mg by mouth daily after supper. 06/07/18  Yes [provider]  ?methylPREDNISolone (MEDROL DOSEPAK) 4 MG TBPK tablet Use as instructed. ?Patient not taking: Reported on 12/19/2021 12/10/21   Curt Bears, MD  ?ondansetron (ZOFRAN) 8 MG tablet TAKE 1 TABLET BY MOUTH EVERY 8 HOURS  AS NEEDED FOR NAUSEA OR VOMITING. ?Patient taking differently: Take 8 mg by mouth every 8 (eight) hours as needed for nausea or vomiting. 11/05/21   Heilingoetter, Cassandra L, PA-C  ?temozolomide (TEMODAR) 140 MG capsule Take 2 capsules (280 mg total) by mouth daily. Take on days 1-7 and days 15-21 of each 28 day cycle. May take on an empty stomach or at bedtime to decrease nausea & vomiting. 10/02/21   Curt Bears, MD  ?   ? ?Allergies    ?Cyanoacrylate, Other, Codeine, and Lentil   ? ?Review of Systems   ?Review of Systems  ?All other systems reviewed and are negative. ? ?Physical Exam ?Updated Vital Signs ?BP 129/84   Pulse (!) 109   Temp 98.2 ?F (36.8 ?C)   Resp (!) 24   SpO2 99%  ?Physical Exam ?Vitals and nursing note reviewed.  ?Constitutional:   ?   General: She is not in acute distress. ?   Appearance: Normal appearance. She is well-developed.  ?HENT:  ?   Head: Normocephalic and atraumatic.  ?Eyes:  ?   Conjunctiva/sclera: Conjunctivae normal.  ?   Pupils: Pupils are equal, round, and reactive to light.  ?Cardiovascular:  ?   Rate and Rhythm: Normal rate and regular rhythm.  ?   Heart sounds: Normal heart sounds.  ?Pulmonary:  ?   Effort: Pulmonary effort is normal. No respiratory distress.  ?   Breath sounds: Normal breath sounds.  ?Abdominal:  ?   General: There is no distension.  ?   Palpations: Abdomen is soft.  ?  Tenderness: There is no abdominal tenderness.  ?Musculoskeletal:     ?   General: No deformity. Normal range of motion.  ?   Cervical back: Normal range of motion and neck supple.  ?Skin: ?   General: Skin is warm and dry.  ?   Comments: Diffuse urticarial rash and diffuse petechiae secondary to scratching noted  ?Neurological:  ?   General: No focal deficit present.  ?   Mental Status: She is alert and oriented to person, place, and time.  ? ? ?ED Results / Procedures / Treatments   ?Labs ?(all labs ordered are listed, but only abnormal results are displayed) ?Labs Reviewed  ?CBC  WITH DIFFERENTIAL/PLATELET - Abnormal; Notable for the following components:  ?    Result Value  ? Platelets 9 (*)   ? All other components within normal limits  ?COMPREHENSIVE METABOLIC PANEL - Abnormal; Notable for the following components:  ? Chloride 112 (*)   ? Glucose, Bld 202 (*)   ? Calcium 8.3 (*)   ? All other components within normal limits  ?PROTIME-INR  ?TYPE AND SCREEN  ? ? ?EKG ?EKG Interpretation ? ?Date/Time:  Wednesday December 19 2021 18:13:37 EDT ?Ventricular Rate:  92 ?PR Interval:  119 ?QRS Duration: 85 ?QT Interval:  396 ?QTC Calculation: 490 ?R Axis:   66 ?Text Interpretation: Sinus rhythm Borderline short PR interval Borderline prolonged QT interval Confirmed by Dene Gentry [id number]) on 12/19/2021 6:17:27 PM ? ?Radiology ?No results found. ? ?Procedures ?Procedures  ? ? ?Medications Ordered in ED ?Medications  ?methylPREDNISolone sodium succinate (SOLU-MEDROL) 125 mg/2 mL injection 125 mg (125 mg Intravenous Given 12/19/21 1843)  ? ? ?ED Course/ Medical Decision Making/ A&P ?  ?                        ?Medical Decision Making ?Amount and/or Complexity of Data Reviewed ?Labs: ordered. ? ?Risk ?Prescription drug management. ?Decision regarding hospitalization. ? ? ? ?Medical Screen Complete ? ?This patient presented to the ED with complaint of transfusion reaction, thrombocytopenia. ? ?This complaint involves an extensive number of treatment options. The initial differential diagnosis includes, but is not limited to, thrombocytopenia, transfusion reaction ? ?This presentation is: Acute, Chronic, Self-Limited, Previously Undiagnosed, Uncertain Prognosis, Complicated, Systemic Symptoms, and Threat to Life/Bodily Function ? ?Patient with previously diagnosed thrombocytopenia presents from oncology clinic after experiencing transfusion reaction with attempted transfusion of platelets. ? ?Patient to be admitted. ? ?Additional steroids given in the ED. ? ?No active bleeding noted in exam ? ?Dr. Lorenso Courier  covering hematology oncology is aware of case and agrees with plan for admission ? ?Hospitalist service is aware case will evaluate for admission. ? ?Additional history obtained: ? ?External records from outside sources obtained and reviewed including prior ED visits and prior Inpatient records.  ? ? ?Lab Tests: ? ?I ordered and personally interpreted labs.  The pertinent results include: See E, CMP, INR, type and screen ? ? ?Cardiac Monitoring: ? ?The patient was maintained on a cardiac monitor.  I personally viewed and interpreted the cardiac monitor which showed an underlying rhythm of: NSR ? ? ?Medicines ordered: ? ?I ordered medication including Solu-Medrol for transfusion reaction ?Reevaluation of the patient after these medicines showed that the patient: improved ? ? ? ?Problem List / ED Course: ? ?Thrombocytopenia, transfusion reaction ? ? ?Reevaluation: ? ?After the interventions noted above, I reevaluated the patient and found that they have: improved ? ? ?Disposition: ? ?After consideration of the  diagnostic results and the patients response to treatment, I feel that the patent would benefit from admission.  ? ? ? ? ? ? ? ? ?Final Clinical Impression(s) / ED Diagnoses ?Final diagnoses:  ?Blood transfusion reaction, initial encounter  ?Thrombocytopenia ([place])  ? ? ?Rx / DC Orders ?ED Discharge Orders   ? ? None  ? ?  ? ? ?  ?Valarie Merino, MD ?12/19/21 2016 ? ?

## 2021-12-19 NOTE — Progress Notes (Signed)
About 12 minutes into platelet transfusion, patient began to complain of feeling hot. Infusion paused, patient then complained of itchiness in her hands. Vitals assessed and were stable, Anda Kraft, [place], to see patient. Redness noted to patient's face and chest- patient began to have generalized itchiness. Saline hung to gravity and 50mg  IV benadryl, 125mg  IV solu-medrol, and 20mg  IV pepcid given per Anda Kraft, [place], verbal order (see MAR for administration times). Both Dr. Julien Nordmann and blood bank were notified of suspected reaction.  ?Per Dr. Julien Nordmann, transfusion to be restarted once symptoms subside and patient returns to baseline.  ?Transfusion restarted at 1515. Patient tolerated first 15 minutes with no adverse reaction. The rate was increased to 300 ml/hr (per verbal order Anda Kraft, [place]). About 2 minutes into increased rate, patient complained of itchiness to left palm of hand, at 1542 patient complained of itchiness to both palms of hands. Transfusion was stopped and 25mg  benadryl and 20mg  pepcid given per verbal order Anda Kraft, [place]. Patient complained of intense, generalized itching and anxiety. Dr. Julien Nordmann notified. Per Dr. Julien Nordmann, patient is to be sent to ER for further evaluation. Vitals remained stable throughout patient's time in [facility]  ?

## 2021-12-19 NOTE — H&P (Signed)
?History and Physical  ? ? ?Brenda Patel EOF:[id number] DOB: Feb 24, 1961 DOA: 12/19/2021 ? ?PCP: Pcp, No ? ?Patient coming from:  Oncology office ? ?Chief Complaint: Transfusion reaction ? ?HPI: Brenda Patel is a 61 y.o. 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  She was on treatment with Temodar and Avastin status post 2 cycles.  She was supposed to start cycle #3 a week ago but was found to be thrombocytopenic with platelet count down to 40,000.  Treatment was placed on hold but her platelet count continued to decline and patient was seen in the ED a few days ago with platelet count down to 18,000.  She was given 1 unit of platelets during this visit.  She was seen at oncology office today and repeat blood work revealed platelet count down to <5000. 1 unit platelets were ordered and after the transfusion was initiated patient had an allergic reaction with pruritus, hot flash, and rash.  Transfusion was stopped and she was given Benadryl, Solu-Medrol, and Pepcid.  After she returned to baseline, transfusion was restarted but patient again started to have severe pruritus and rash.  She was given another dose of Benadryl and Pepcid however symptoms continue to worsen.  Patient also complained of hoarseness and feeling like she had a lump in her throat.  She also had epistaxis which was able to be controlled with pressure. Platelet infusion was discontinued for the possibility of hemolytic and allergic reaction.  Patient was sent to the ED. In the ED, symptoms had improved and patient was not having active epistaxis.  She was given additional IV Solu-Medrol 125 mg.  WBC count pending.  Hemoglobin pending.  Platelet count 9000.  Oncology consulted (Dr. Lorenso Courier). ? ?Patient states she had a nosebleed a few days ago and she received a platelet transfusion at that time without any allergic reaction.  However, today at oncology office  soon after platelet transfusion was started, she started itching.  States transfusion was stopped and she was given medications after which she improved but then after it was restarted, she started itching again and felt a lump in her throat, however, did not have any shortness of breath and her oxygen saturation was 99%.  States she had a mild nosebleed at the clinic which stopped.  Reports nasal congestion and requesting Afrin nasal spray which she has been using at home and has been helping.  At present, she is no longer itching and reports feeling well.  Denies any chest pain, shortness of breath, cough, fevers, nausea, vomiting, abdominal pain, melena, or hematochezia. ? ?Review of Systems:  ?Review of Systems  ?All other systems reviewed and are negative. ? ?Past Medical History:  ?Diagnosis Date  ? Asthma   ? Cancer [facility])   ? Depression   ? Dyspnea   ? Pleural effusion on right   ? Pleural mass   ? ? ?Past Surgical History:  ?Procedure Laterality Date  ? CHEST TUBE INSERTION Right 07/16/2018  ? Procedure: INSERTION PLEURAL DRAINAGE CATHETER, right;  Surgeon: Grace Isaac, MD;  Location: [place];  Service: Thoracic;  Laterality: Right;  ? COLONOSCOPY  2017  ? [place]  ? GRAFTS  ? IR IMAGING GUIDED PORT INSERTION  10/02/2021  ? IR RADIOLOGIST EVAL & MGMT  10/08/2021  ? IR RADIOLOGIST EVAL & MGMT  10/11/2021  ? IR REMOVAL TUN ACCESS W/ PORT W/O FL MOD SED  10/15/2021  ?  IR THORACENTESIS ASP PLEURAL SPACE W/IMG GUIDE  07/03/2018  ? ? ? reports that she has never smoked. She has never used smokeless tobacco. She reports current alcohol use. She reports that she does not use drugs. ? ?Allergies  ?Allergen Reactions  ? Cyanoacrylate Dermatitis  ?  Dermabond Topical Skin Adhesive  ? Other Dermatitis  ?  Bard Kellogg  ? Codeine Nausea Only and Other (See Comments)  ?  Severe constipation, also  ? Lentil Other (See Comments)  ?  [place] All[facility] tested.  ? ? ?History reviewed. No pertinent family  history. ? ?Prior to Admission medications   ?Medication Sig Start Date End Date Taking? Authorizing Provider  ?Calcium Citrate 333 MG TABS Take 333 mg by mouth daily.   Yes [provider]  ?[facility] 12 HOUR NASAL DECONGESTANT 120 MG 12 hr tablet Take 120 mg by mouth daily. 05/31/18  Yes [provider]  ?fluticasone (FLONASE) 50 MCG/ACT nasal spray Place 1 spray into both nostrils daily as needed for allergies. 05/05/18  Yes [provider]  ?meloxicam (MOBIC) 15 MG tablet Take 15 mg by mouth daily as needed for pain.  10/13/19  Yes [provider]  ?montelukast (SINGULAIR) 10 MG tablet Take 10 mg by mouth daily after supper. 06/07/18  Yes [provider]  ?methylPREDNISolone (MEDROL DOSEPAK) 4 MG TBPK tablet Use as instructed. ?Patient not taking: Reported on 12/19/2021 12/10/21   Curt Bears, MD  ?ondansetron (ZOFRAN) 8 MG tablet TAKE 1 TABLET BY MOUTH EVERY 8 HOURS AS NEEDED FOR NAUSEA OR VOMITING. ?Patient taking differently: Take 8 mg by mouth every 8 (eight) hours as needed for nausea or vomiting. 11/05/21   Heilingoetter, Cassandra L, PA-C  ?temozolomide (TEMODAR) 140 MG capsule Take 2 capsules (280 mg total) by mouth daily. Take on days 1-7 and days 15-21 of each 28 day cycle. May take on an empty stomach or at bedtime to decrease nausea & vomiting. 10/02/21   Curt Bears, MD  ? ? ?Physical Exam: ?Vitals:  ? 12/19/21 1815 12/19/21 1830 12/19/21 1900 12/19/21 1930  ?BP: (!) 142/76 133/78 138/80 129/84  ?Pulse: (!) 101 (!) 101 (!) 110 (!) 109  ?Resp: 12 16 19  (!) 24  ?Temp:      ?SpO2: 100% 100% 99% 99%  ? ? ?Physical Exam ?Constitutional:   ?   General: She is not in acute distress. ?HENT:  ?   Head: Normocephalic and atraumatic.  ?Eyes:  ?   Extraocular Movements: Extraocular movements intact.  ?   Conjunctiva/sclera: Conjunctivae normal.  ?Cardiovascular:  ?   Rate and Rhythm: Normal rate and regular rhythm.  ?   Pulses: Normal pulses.  ?Pulmonary:  ?   Effort:  Pulmonary effort is normal. No respiratory distress.  ?   Breath sounds: Normal breath sounds. No wheezing or rales.  ?Abdominal:  ?   General: Bowel sounds are normal. There is no distension.  ?   Palpations: Abdomen is soft.  ?   Tenderness: There is no abdominal tenderness.  ?Musculoskeletal:     ?   General: No swelling or tenderness.  ?   Cervical back: Normal range of motion and neck supple.  ?Skin: ?   General: Skin is warm and dry.  ?   Comments: Diffuse petechiae noted on trunk and extremities  ?Neurological:  ?   General: No focal deficit present.  ?   Mental Status: She is alert and oriented to person, place, and time.  ?  ? ?  Labs on Admission: I have personally reviewed following labs and imaging studies ? ?CBC: ?Recent Labs  ?Lab 12/14/21 ?1101 12/19/21 ?0854 12/19/21 ?1704  ?WBC 8.1 4.5 PENDING  ?NEUTROABS 6.8 3.3 PENDING  ?HGB 12.8 11.4* PENDING  ?HCT 38.8 34.5* PENDING  ?MCV 94.4 93.8 PENDING  ?PLT 18* <5* 9*  ? ?Basic Metabolic Panel: ?Recent Labs  ?Lab 12/14/21 ?1101 12/19/21 ?1704  ?NA 141 140  ?K 3.7 4.0  ?CL 105 112*  ?CO2 28 22  ?GLUCOSE 98 202*  ?BUN 18 15  ?CREATININE 0.59 0.73  ?CALCIUM 9.8 8.3*  ? ?GFR: ?Estimated Creatinine Clearance: 77.8 mL/min (by C-G formula based on SCr of 0.73 mg/dL). ?Liver Function Tests: ?Recent Labs  ?Lab 12/14/21 ?1101 12/19/21 ?1704  ?AST 23 26  ?ALT 20 22  ?ALKPHOS 89 85  ?BILITOT 0.8 0.6  ?PROT 7.8 6.6  ?ALBUMIN 4.5 3.6  ? ?No results for input(s): LIPASE, AMYLASE in the last 168 hours. ?No results for input(s): AMMONIA in the last 168 hours. ?Coagulation Profile: ?Recent Labs  ?Lab 12/14/21 ?1101 12/19/21 ?1704  ?INR 0.9 1.0  ? ?Cardiac Enzymes: ?No results for input(s): CKTOTAL, CKMB, CKMBINDEX, TROPONINI in the last 168 hours. ?BNP (last 3 results) ?No results for input(s): PROBNP in the last 8760 hours. ?HbA1C: ?No results for input(s): HGBA1C in the last 72 hours. ?CBG: ?No results for input(s): GLUCAP in the last 168 hours. ?Lipid Profile: ?No results  for input(s): CHOL, HDL, LDLCALC, TRIG, CHOLHDL, LDLDIRECT in the last 72 hours. ?Thyroid Function Tests: ?No results for input(s): TSH, T4TOTAL, FREET4, T3FREE, THYROIDAB in the last 72 hours. ?Anemia Panel: ?N

## 2021-12-19 NOTE — Progress Notes (Signed)
? ? ? ?Symptom Management Consult note ?[place]   ? ?Patient Care Team: ?Pa, [place] as PCP - General (Family Medicine)  ? ? ?Name of the patient: Brenda Patel  [id number]  [birth year]  ? ?Date of visit: 12/19/2021  ? ? ?Chief complaint/ Reason for visit-thrombocytopenia, infusion reaction ? ?Oncology History  ? No history exists.  ? ? ?Current Therapy: Bevacizumab-bvzr day 1 cycle 3   11/27/2021 ? 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  Patient had lab draw today that showed she had a platelet count of less than 5 therefore transfusion was ordered.  This is patient's second blood transfusion.  She tolerated the first 1 without any difficulty.  During the transfusion patient had pruritus, hot flash and rash.  Transfusion was stopped and she was given Benadryl, Solu-Medrol and Pepcid.  Patient returned to baseline and transfusion was restarted.  Unfortunately patient again started to have severe pruritus and rash.  She was given another dose of Benadryl and Pepcid however symptoms continue to worsen.  Patient then complained of hoarseness and feeling like she had a lump in her throat.  Her nose also began to bleed which was able to be controlled with pressure.  Denies any fever, chills, chest pain, shortness of breath, abdominal pain, nausea, vomiting. ? ? ? ?ROS  ?All other systems are reviewed and are negative for acute change except as noted in the HPI. ? ? ? ?Allergies  ?Allergen Reactions  ? Cyanoacrylate Dermatitis  ?  Dermabond Topical Skin Adhesive  ? Other Dermatitis  ?  Bard Kellogg  ? Codeine Nausea Only and Other (See Comments)  ?  Severe constipation, also  ? Lentil Other (See Comments)  ?  [place] Allergy tested.  ? ? ? ?Past Medical History:  ?Diagnosis Date  ? Asthma   ? Cancer [facility])   ? Depression   ? Dyspnea   ?  Pleural effusion on right   ? Pleural mass   ? ? ? ?Past Surgical History:  ?Procedure Laterality Date  ? CHEST TUBE INSERTION Right 07/16/2018  ? Procedure: INSERTION PLEURAL DRAINAGE CATHETER, right;  Surgeon: Grace Isaac, MD;  Location: [place];  Service: Thoracic;  Laterality: Right;  ? COLONOSCOPY  2017  ? [place]  ? GRAFTS  ? IR IMAGING GUIDED PORT INSERTION  10/02/2021  ? IR RADIOLOGIST EVAL & MGMT  10/08/2021  ? IR RADIOLOGIST EVAL & MGMT  10/11/2021  ? IR REMOVAL TUN ACCESS W/ PORT W/O FL MOD SED  10/15/2021  ? IR THORACENTESIS ASP PLEURAL SPACE W/IMG GUIDE  07/03/2018  ? ? ?Social History  ? ?Socioeconomic History  ? Marital status: Divorced  ?  Spouse name: Not on file  ? Number of children: Not on file  ? Years of education: Not on file  ? Highest education level: Not on file  ?Occupational History  ? Not on file  ?Tobacco Use  ? Smoking status: Never  ? Smokeless tobacco: Never  ?Vaping Use  ? Vaping Use: Never used  ?Substance and Sexual Activity  ? Alcohol use: Yes  ?  Comment: 3-4 glasses of wine per month/liquor 1x monthly  ? Drug use: Never  ? Sexual activity: Not on file  ?Other Topics Concern  ? Not on file  ?Social History Narrative  ? Not on file  ? ?  Social Determinants of Health  ? ?Financial Resource Strain: Not on file  ?Food Insecurity: Not on file  ?Transportation Needs: Not on file  ?Physical Activity: Not on file  ?Stress: Not on file  ?Social Connections: Not on file  ?Intimate Partner Violence: Not on file  ? ? ?No family history on file. ? ? ?Current Outpatient Medications:  ?  Calcium Citrate 333 MG TABS, Take 333 mg by mouth daily., Disp: , Rfl:  ?  [employer] 12 HOUR NASAL DECONGESTANT 120 MG 12 hr tablet, Take 120 mg by mouth daily., Disp: , Rfl: 10 ?  fluticasone (FLONASE) 50 MCG/ACT nasal spray, Place 1 spray into both nostrils daily as needed for allergies., Disp: , Rfl: 5 ?  meloxicam (MOBIC) 15 MG tablet, Take 15 mg by mouth daily as needed for pain. , Disp: , Rfl:  ?   methylPREDNISolone (MEDROL DOSEPAK) 4 MG TBPK tablet, Use as instructed., Disp: 21 tablet, Rfl: 0 ?  montelukast (SINGULAIR) 10 MG tablet, Take 10 mg by mouth daily after supper., Disp: , Rfl: 10 ?  ondansetron (ZOFRAN) 8 MG tablet, TAKE 1 TABLET BY MOUTH EVERY 8 HOURS AS NEEDED FOR NAUSEA OR VOMITING., Disp: 18 tablet, Rfl: 1 ?  temozolomide (TEMODAR) 140 MG capsule, Take 2 capsules (280 mg total) by mouth daily. Take on days 1-7 and days 15-21 of each 28 day cycle. May take on an empty stomach or at bedtime to decrease nausea & vomiting., Disp: 28 capsule, Rfl: 3 ?No current facility-administered medications for this visit. ? ?Facility-Administered Medications Ordered in Other Visits:  ?  0.9 %  sodium chloride infusion, , Intravenous, Once, Walisiewicz, Lashane Whelpley E, PA-C ?  diphenhydrAMINE (BENADRYL) injection 50 mg, 50 mg, Intravenous, Once, Walisiewicz, Samual Beals E, PA-C ?  famotidine (PEPCID) IVPB 20 mg premix, 20 mg, Intravenous, Once, Walisiewicz, Chauntelle Azpeitia E, PA-C ?  methylPREDNISolone sodium succinate (SOLU-MEDROL) 125 mg/2 mL injection 125 mg, 125 mg, Intravenous, Once, Walisiewicz, Aven Christen E, PA-C ? ?PHYSICAL EXAM: ?ECOG FS:1 - Symptomatic but completely ambulatory ? ?T: 99.2   BP: 133/70   HR: 114  O2: 100% RA ?Physical Exam ?Vitals and nursing note reviewed.  ?Constitutional:   ?   Appearance: She is well-developed. She is not ill-appearing or toxic-appearing.  ?HENT:  ?   Head: Normocephalic and atraumatic.  ?   Nose: Nose normal.  ?   Comments: Epistaxis from right nare. No clot. Bleeding controlled with pressure ?Eyes:  ?   General: No scleral icterus.    ?   Right eye: No discharge.     ?   Left eye: No discharge.  ?   Conjunctiva/sclera: Conjunctivae normal.  ?Neck:  ?   Vascular: No JVD.  ?Cardiovascular:  ?   Rate and Rhythm: Regular rhythm. Tachycardia present.  ?   Pulses: Normal pulses.  ?   Heart sounds: Normal heart sounds.  ?Pulmonary:  ?   Effort: Pulmonary effort is normal.  ?   Breath sounds:  Normal breath sounds.  ?Abdominal:  ?   General: There is no distension.  ?Musculoskeletal:     ?   General: Normal range of motion.  ?   Cervical back: Normal range of motion.  ?Skin: ?   General: Skin is warm and dry.  ?   Capillary Refill: Capillary refill takes less than 2 seconds.  ?   Findings: Erythema and rash (Hives and erythema on chest and face) present.  ?   Comments: Petechiae on arms and torso  ?  Neurological:  ?   Mental Status: She is oriented to person, place, and time.  ?   GCS: GCS eye subscore is 4. GCS verbal subscore is 5. GCS motor subscore is 6.  ?   Comments: Fluent speech, no facial droop.  ?Psychiatric:     ?   Behavior: Behavior normal.  ?  ? ? ? ?LABORATORY DATA: ?I have reviewed the data as listed ? ?  Latest Ref Rng & Units 12/19/2021  ?  8:54 AM 12/14/2021  ? 11:01 AM 12/10/2021  ?  1:28 PM  ?CBC  ?WBC 4.0 - 10.5 K/uL 4.5   8.1   6.0    ?Hemoglobin 12.0 - 15.0 g/dL 11.4   12.8   12.4    ?Hematocrit 36.0 - 46.0 % 34.5   38.8   36.8    ?Platelets 150 - 400 K/uL <5   18   42    ? ? ? ? ?  Latest Ref Rng & Units 12/14/2021  ? 11:01 AM 12/10/2021  ?  1:28 PM 11/27/2021  ?  1:56 PM  ?CMP  ?Glucose 70 - 99 mg/dL 98   108   100    ?BUN 6 - 20 mg/dL 18   22   18     ?Creatinine 0.44 - 1.00 mg/dL 0.59   0.83   0.84    ?Sodium 135 - 145 mmol/L 141   140   141    ?Potassium 3.5 - 5.1 mmol/L 3.7   3.8   4.2    ?Chloride 98 - 111 mmol/L 105   103   105    ?CO2 22 - 32 mmol/L 28   31   28     ?Calcium 8.9 - 10.3 mg/dL 9.8   9.8   9.8    ?Total Protein 6.5 - 8.1 g/dL 7.8   7.4   7.4    ?Total Bilirubin 0.3 - 1.2 mg/dL 0.8   0.5   0.5    ?Alkaline Phos 38 - 126 U/L 89   90   85    ?AST 15 - 41 U/L 23   24   35    ?ALT 0 - 44 U/L 20   22   26     ? ? ? ? ? ?RADIOGRAPHIC STUDIES: ?I have personally reviewed the radiological images as listed and agreed with the findings in the report. ?No images are attached to the encounter. ?No results found.  ? ?ASSESSMENT & PLAN: ?Patient is a 61 y.o. female with history of  sarcomatoid carcinoma of right lung followed by oncologist Dr. Earlie Server ? ?#) Allergic reaction vs hemolytic reaction-patient having reaction during platelet transfusion.  Emergency medications were given inc

## 2021-12-19 NOTE — ED Triage Notes (Signed)
Pt here from the cancer center after having an allergic reaction to platelets. Pt with redness, hives, itching and feels like a lump in her throat. Given 40mg  pepcid, 75mg  benadryl and 125mg  solumedrol prior to arrival in ED.  ?

## 2021-12-19 NOTE — ED Notes (Signed)
Pt ambulated to restroom without distress. Instructed to be cautious not to be careful since she is at bleeding risk. She understands.  ?

## 2021-12-19 NOTE — ED Notes (Signed)
Pt hives and redness dramatically improved from arrival.  ?

## 2021-12-20 ENCOUNTER — Other Ambulatory Visit (HOSPITAL_COMMUNITY): Payer: Self-pay

## 2021-12-20 DIAGNOSIS — T451X5A Adverse effect of antineoplastic and immunosuppressive drugs, initial encounter: Secondary | ICD-10-CM

## 2021-12-20 DIAGNOSIS — T8092XA Unspecified transfusion reaction, initial encounter: Secondary | ICD-10-CM | POA: Diagnosis not present

## 2021-12-20 DIAGNOSIS — R21 Rash and other nonspecific skin eruption: Secondary | ICD-10-CM | POA: Diagnosis present

## 2021-12-20 DIAGNOSIS — Z79899 Other long term (current) drug therapy: Secondary | ICD-10-CM | POA: Diagnosis not present

## 2021-12-20 DIAGNOSIS — C3431 Malignant neoplasm of lower lobe, right bronchus or lung: Secondary | ICD-10-CM | POA: Diagnosis present

## 2021-12-20 DIAGNOSIS — D72819 Decreased white blood cell count, unspecified: Secondary | ICD-10-CM | POA: Diagnosis present

## 2021-12-20 DIAGNOSIS — Z888 Allergy status to other drugs, medicaments and biological substances status: Secondary | ICD-10-CM | POA: Diagnosis not present

## 2021-12-20 DIAGNOSIS — L299 Pruritus, unspecified: Secondary | ICD-10-CM | POA: Diagnosis present

## 2021-12-20 DIAGNOSIS — D696 Thrombocytopenia, unspecified: Secondary | ICD-10-CM | POA: Diagnosis not present

## 2021-12-20 DIAGNOSIS — Z91018 Allergy to other foods: Secondary | ICD-10-CM | POA: Diagnosis not present

## 2021-12-20 DIAGNOSIS — T8092XD Unspecified transfusion reaction, subsequent encounter: Secondary | ICD-10-CM | POA: Diagnosis not present

## 2021-12-20 DIAGNOSIS — C3491 Malignant neoplasm of unspecified part of right bronchus or lung: Secondary | ICD-10-CM | POA: Diagnosis not present

## 2021-12-20 DIAGNOSIS — Z885 Allergy status to narcotic agent status: Secondary | ICD-10-CM | POA: Diagnosis not present

## 2021-12-20 DIAGNOSIS — R739 Hyperglycemia, unspecified: Secondary | ICD-10-CM | POA: Diagnosis present

## 2021-12-20 DIAGNOSIS — D6959 Other secondary thrombocytopenia: Secondary | ICD-10-CM

## 2021-12-20 DIAGNOSIS — Z791 Long term (current) use of non-steroidal anti-inflammatories (NSAID): Secondary | ICD-10-CM | POA: Diagnosis not present

## 2021-12-20 DIAGNOSIS — J45909 Unspecified asthma, uncomplicated: Secondary | ICD-10-CM | POA: Diagnosis present

## 2021-12-20 DIAGNOSIS — R040 Epistaxis: Secondary | ICD-10-CM | POA: Diagnosis present

## 2021-12-20 LAB — TYPE AND SCREEN
ABO/RH(D): O POS
ABO/RH(D): O POS
Antibody Screen: NEGATIVE
Antibody Screen: NEGATIVE

## 2021-12-20 LAB — BPAM PLATELET PHERESIS
Blood Product Expiration Date: 202304282359
ISSUE DATE / TIME: 202304261233
Unit Type and Rh: 7300

## 2021-12-20 LAB — PREPARE PLATELET PHERESIS: Unit division: 0

## 2021-12-20 LAB — HIV ANTIBODY (ROUTINE TESTING W REFLEX): HIV Screen 4th Generation wRfx: NONREACTIVE

## 2021-12-20 LAB — HEMOGLOBIN A1C
Hgb A1c MFr Bld: 6 % — ABNORMAL HIGH (ref 4.8–5.6)
Mean Plasma Glucose: 125.5 mg/dL

## 2021-12-20 MED ORDER — SODIUM CHLORIDE 0.9% IV SOLUTION: Freq: Once | INTRAVENOUS | Status: AC

## 2021-12-20 MED ORDER — DIPHENHYDRAMINE HCL 50 MG/ML IJ SOLN
50.0000 mg | Freq: Once | INTRAMUSCULAR | Status: AC
  Administered 2021-12-20: 50 mg via INTRAVENOUS
  Filled 2021-12-20: qty 1

## 2021-12-20 MED ORDER — ACETAMINOPHEN 325 MG PO TABS
650.0000 mg | ORAL_TABLET | Freq: Once | ORAL | Status: AC
  Administered 2021-12-20: 650 mg via ORAL
  Filled 2021-12-20: qty 2

## 2021-12-20 MED ORDER — SODIUM CHLORIDE 0.9 % IV SOLN
INTRAVENOUS | Status: DC | PRN
Start: 2021-12-20 — End: 2021-12-21

## 2021-12-20 MED ORDER — METHYLPREDNISOLONE SODIUM SUCC 125 MG IJ SOLR
125.0000 mg | Freq: Once | INTRAMUSCULAR | Status: AC
  Administered 2021-12-20: 125 mg via INTRAVENOUS
  Filled 2021-12-20: qty 2

## 2021-12-20 MED ORDER — FAMOTIDINE IN NACL 20-0.9 MG/50ML-% IV SOLN
20.0000 mg | Freq: Once | INTRAVENOUS | Status: AC
  Administered 2021-12-20: 20 mg via INTRAVENOUS
  Filled 2021-12-20: qty 50

## 2021-12-20 NOTE — Progress Notes (Signed)
?  Transition of Care (TOC) Screening Note ? ? ?Patient Details  ?Name: Brenda Patel ?Date of Birth: 1961/08/20 ? ? ?Transition of Care (TOC) CM/SW Contact:    ?Allaina Brotzman, LCSW ?Phone Number: ?12/20/2021, 11:16 AM ? ? ? ?Transition of Care Department [facility]) has reviewed patient and no TOC needs have been identified at this time. We will continue to monitor patient advancement through interdisciplinary progression rounds. If new patient transition needs arise, please place a TOC consult. ? ? ?

## 2021-12-20 NOTE — Progress Notes (Signed)
HEMATOLOGY-ONCOLOGY PROGRESS NOTE ? ?ASSESSMENT AND PLAN: ?This is a very pleasant 61 year old white female presented with large right lower lobe lung mass in addition to right pleural effusion.  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 ?The patient was referred to Dr. Angelina Ok at [facility] for second opinion and she underwent several studies and intervention as listed below ?1) repeat biopsy on August 11, 2018 of the lung mass at [facility] and it showed atypical spindle cell proliferation. ?2) on September 23, 2018 she underwent bilateral transexternal thoracotomies for resection of the right pleural-based tumor with en bloc right lower lobe wedge resection and the pathology revealed 18 cm solitary fibrous tumor with negative margins. ?3) restaging CT scan of the chest on September 14, 2019 showed new 1.5 cm soft tissue lesion along the right subpulmonic diaphragmatic pleural surface worrisome for tumor recurrence.  She was followed by observation by Dr. Angelina Ok at [place] [facility] ?4) SBRT to the right lower lobe/diaphragmatic lesion completed June 22, 2020. ?5) restaging scan on November 30, 2020 showed new right posterior pleural-based nodule followed by observation.  But repeat CT scan of the chest on 02/07/2021 showed enlarging right pleural mass/nodules. ?6) the patient was started treatment with pazopanib on 02/17/2021 400 mg titrated to 800 mg p.o. daily but her treatment was held between July 8 to March 05, 2021 secondary to fatigue and arthralgia.  She resumed her treatment at a dose of 400 mg p.o. daily for 3 days titrated to 600 mg p.o. daily but this was again held in mid August 2022.  She resumed her treatment on 04/23/2021 at 400 mg p.o. daily but this was discontinued in October 2022 secondary to disease progression was a scan on 05/30/2021 revealing progressive multifocal pleural-based disease. ?Dr. Angelina Ok recommended  for the patient treatment with Temodar and Avastin.  She started Temodar 150 Mg/M2 on days 1 and 7 as well as day 15-21 in addition to Avastin 5 Mg/KG on days 1 and 22 every 4 weeks.  She is status post 2 cycles.   ? ?She has been tolerating this treatment well overall but has recently developed thrombocytopenia.  She has been receiving platelet transfusions in our office but developed transfusion reaction during a platelet transfusion on 12/19/2021.  She was transferred to the emergency department for further evaluation and for platelet transfusion.  She did not receive any platelets as recommended in the ED. ? ?Her platelet count this morning is 9000.  She is not actively bleeding.  I had a lengthy discussion with the patient today regarding a repeat platelet transfusion.  We discussed premedication with Tylenol 650 mg p.o., Benadryl 50 mg IV, Solu-Medrol 125 mg IV, and Pepcid 20 mg IV.  We will also run the transfusion as low as possible-over 4 hours.  She will be monitored very closely in the ICU/stepdown unit.  I will go ahead and repeat a type and screen today to be sure that there are no antibodies before proceeding. ? ?Recommend for platelet count to be 20,000 or higher.  We can consider hospital discharge once her platelet count has improved to above this threshold. ? ?Continue to hold Temodar. ? ?Brenda Bussing, DNP, AGPCNP-BC, AOCNP ? ?SUBJECTIVE: [name] is followed by Dr. Julien Nordmann for malignant neoplasm with sarcomatoid features.  She has been receiving systemic treatment with Temodar and Avastin.  She had routine lab work performed in our office yesterday which showed a platelet  count less than 5000.  A platelet transfusion was ordered.  She had a reaction to the platelet transfusion after about 10 minutes.  Reaction consisted of primarily itching.  She was given Benadryl, Solu-Medrol, and Pepcid after she reacted and the platelet transfusion was attempted again.  Unfortunately, she again developed a  reaction to the transfusion and was transferred to the emergency department for evaluation and to complete a platelet transfusion with close monitoring.  However, the patient did not receive a platelet transfusion as recommended in the ED. ? ?Morning, she notes petechiae over her whole body.  She attributes this to itching.  She is not having any obvious bleeding such as epistaxis, hemoptysis, hematemesis, hematuria, melena, hematochezia.  She had a slight headache this morning and took Tylenol.  She has no other complaints this morning ? ?REVIEW OF SYSTEMS:   ?Review of Systems  ?Constitutional:  Negative for chills and fever.  ?HENT: Negative.    ?Respiratory: Negative.    ?Cardiovascular: Negative.   ?Gastrointestinal:  Negative for blood in stool and melena.  ?Genitourinary:  Negative for hematuria.  ?Skin:  Positive for itching.  ?Neurological: Negative.   ?Psychiatric/Behavioral: Negative.    ? ?I have reviewed the past medical history, past surgical history, social history and family history with the patient and they are unchanged from previous note. ? ? ?PHYSICAL EXAMINATION: ?ECOG PERFORMANCE STATUS: 1 - Symptomatic but completely ambulatory ? ?Vitals:  ? 12/20/21 0757 12/20/21 0800  ?BP:  (!) 153/75  ?Pulse:  92  ?Resp:  17  ?Temp: 98.3 ?F (36.8 ?C)   ?SpO2:  100%  ? ?Filed Weights  ? 12/19/21 2144  ?Weight: 71.5 kg  ? ? ?Intake/Output from previous day: ?04/26 0701 - 04/27 0700 ?In: -  ?Out: 600 [Urine:600] ? ?Physical Exam ?Vitals reviewed.  ?Constitutional:   ?   General: She is not in acute distress. ?HENT:  ?   Head: Normocephalic.  ?   Mouth/Throat:  ?   Comments: No petechiae in the oropharynx ?Pulmonary:  ?   Effort: Pulmonary effort is normal. No respiratory distress.  ?Skin: ?   General: Skin is warm and dry.  ?   Findings: Bruising present.  ?   Comments: Petechiae noted over her legs, arms, chest  ?Neurological:  ?   Mental Status: She is alert and oriented to person, place, and time.   ? ? ?LABORATORY DATA:  ?I have reviewed the data as listed ? ?  Latest Ref Rng & Units 12/19/2021  ?  5:04 PM 12/14/2021  ? 11:01 AM 12/10/2021  ?  1:28 PM  ?CMP  ?Glucose 70 - 99 mg/dL 202   98   108    ?BUN 6 - 20 mg/dL 15   18   22     ?Creatinine 0.44 - 1.00 mg/dL 0.73   0.59   0.83    ?Sodium 135 - 145 mmol/L 140   141   140    ?Potassium 3.5 - 5.1 mmol/L 4.0   3.7   3.8    ?Chloride 98 - 111 mmol/L 112   105   103    ?CO2 22 - 32 mmol/L 22   28   31     ?Calcium 8.9 - 10.3 mg/dL 8.3   9.8   9.8    ?Total Protein 6.5 - 8.1 g/dL 6.6   7.8   7.4    ?Total Bilirubin 0.3 - 1.2 mg/dL 0.6   0.8   0.5    ?  Alkaline Phos 38 - 126 U/L 85   89   90    ?AST 15 - 41 U/L 26   23   24     ?ALT 0 - 44 U/L 22   20   22     ? ? ?Lab Results  ?Component Value Date  ? WBC 3.9 (L) 12/20/2021  ? HGB 10.4 (L) 12/20/2021  ? HCT 31.5 (L) 12/20/2021  ? MCV 94.9 12/20/2021  ? PLT 9 (LL) 12/20/2021  ? NEUTROABS 5.8 12/19/2021  ? ? ?No results found for: CEA1, CEA, [id number], CA125, PSA1 ? ?No results found. ? ? ?Future Appointments  ?Date Time Provider [place]  ?12/21/2021  9:30 AM [facility] LAB [facility] None  ?12/21/2021 10:00 AM Heilingoetter, Cassandra L, PA-C [facility] None  ?12/21/2021 11:00 AM [facility] INFUSION [facility] None  ?12/24/2021  2:00 PM [facility] LAB [facility] None  ?12/24/2021  2:30 PM Heilingoetter, Cassandra L, PA-C [facility] None  ?12/24/2021  3:30 PM [facility] INFUSION [facility] None  ?01/08/2022  1:00 PM [facility] LAB [facility] None  ?01/08/2022  1:45 PM Curt Bears, MD [facility] None  ?01/08/2022  2:30 PM [facility] INFUSION [facility] None  ?01/22/2022  7:30 AM [facility] LAB [facility] None  ?01/22/2022  8:00 AM Heilingoetter, Cassandra L, PA-C [facility] None  ?01/22/2022  9:00 AM [facility] INFUSION [facility] None  ?  ? ? LOS: 0 days  ?

## 2021-12-20 NOTE — Progress Notes (Signed)
?PROGRESS NOTE ? ?Brenda Patel  EQA:[id number] DOB: [birth year] DOA: 12/19/2021 ?PCP: Pcp, No  ? ?Brief Narrative: ? 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  Patient developed allergic reaction after she was transfused with platelets: Developed pruritus, hot flash, rash.  Transfusion was stopped and was given Benadryl, Solu-Medrol and Pepcid.  Also developed mild nasal bleeding.  Platelet count in the range of 9K, oncology following. ? ? ? ?Assessment & Plan: ? ?Principal Problem: ?  Transfusion reaction ?Active Problems: ?  Sarcomatoid carcinoma of lung, right ([place]) ?  Thrombocytopenia ([place]) ?  Asthma ?  Hyperglycemia ? ?Severe thrombocytopenia/platelet transfusion reaction: Thrombocytopenia is most likely secondary to chemotherapy.  Found to have platelets level less than 5000 in the oncology office.  Given platelet transfusion with development of pruritus, hot flashes, rash.  Transfusion stopped, given Benadryl, Solu-Medrol, Pepcid.  Had an episode of mild epistaxis at oncology office but not at this time.  Platelet count currently 9000.  Oncology will follow.  Will check if she needs platelet transfusion, she has high risk of allergic reaction/anaphylaxis. ? ?Normocytic anemia/leukopenia: Hemoglobin in the range of 10.  This is also likely associated with chemotherapy.Monitor ? ?Sarcomatoid carcinoma of right lung: Status post 2 cycles of Temodar, Avastin.  Currently chemotherapy on hold due to significant thrombocytopenia. ? ?Hyperglycemia: No history of diabetes.  A1c of 6.  Likely triggered by steroids.  Monitor ? ?Asthma: Currently without exacerbation.  On Singulair.  Continue with bronchodilators as needed ? ? ? ? ? ?  ?  ? ?DVT prophylaxis:SCDs Start: 12/19/21  2020 ? ? ?  Code Status: Full Code ? ?Family Communication: None at the bedside ? ?Patient status:inpatient ? ?Patient is from :Home ? ?Anticipated discharge LN:[facility] ? ?Estimated DC date: Needs improvement in the platelets level before discharge ? ? ?Consultants: Oncology ? ?Procedures: None ? ?Antimicrobials:  ?Anti-infectives (From admission, onward)  ? ? None  ? ?  ? ? ?Subjective: ?Seen and examined at bedside this morning.  Hemodynamically stable.  Comfortable without any complaints.  No active epistaxis at the moment. ? ?Objective: ?Vitals:  ? 12/19/21 2146 12/20/21 0000 12/20/21 0322 12/20/21 0400  ?BP:  137/77  127/77  ?Pulse: (!) 109 90  87  ?Resp: 19 13  18   ?Temp:   98.1 ?F (36.7 ?C)   ?TempSrc:   Oral   ?SpO2: 98% 98%  99%  ?Weight:      ?Height:      ? ? ?Intake/Output Summary (Last 24 hours) at 12/20/2021 0729 ?Last data filed at 12/20/2021 0300 ?Gross per 24 hour  ?Intake --  ?Output 400 ml  ?Net -400 ml  ? ?Filed Weights  ? 12/19/21 2144  ?Weight: 71.5 kg  ? ? ?Examination: ? ?General exam: Overall comfortable, not in distress ?HEENT: PERRL ?Respiratory system:  no wheezes or crackles  ?Cardiovascular system: S1 & S2 heard, RRR.  ?Gastrointestinal system: Abdomen is nondistended, soft and nontender. ?Central nervous system: Alert and oriented ?Extremities: No edema, no clubbing ,no cyanosis ?Skin: Petechial hemorrhage, ecchymosis ? ? ?Data Reviewed: I have personally reviewed following labs and imaging studies ? ?CBC: ?Recent Labs  ?Lab 12/14/21 ?1101 12/19/21 ?0854 12/19/21 ?1704 12/20/21 ?0230  ?WBC 8.1 4.5 6.2 3.9*  ?NEUTROABS 6.8 3.3 5.8  --   ?HGB 12.8 11.4* 11.2* 10.4*  ?HCT 38.8  34.5* 32.8* 31.5*  ?MCV 94.4 93.8 94.5 94.9  ?PLT 18* <5* 9* 9*  ? ?Basic Metabolic Panel: ?Recent Labs  ?Lab 12/14/21 ?1101 12/19/21 ?1704  ?NA 141 140  ?K 3.7 4.0  ?CL 105 112*  ?CO2 28 22  ?GLUCOSE 98 202*  ?BUN 18 15  ?CREATININE 0.59 0.73  ?CALCIUM 9.8 8.3*  ? ? ? ?No results found for this or any previous visit  (from the past 240 hour(s)).  ? ?Radiology Studies: ?No results found. ? ?Scheduled Meds: ? Chlorhexidine Gluconate Cloth  6 each Topical Daily  ? montelukast  10 mg Oral QPC supper  ? oxymetazoline  1 spray Each Nare BID  ? ?Continuous Infusions: ? ? LOS: 0 days  ? ?Shelly Coss, MD ?[facility] ?P4/27/2023, 7:29 AM   ?

## 2021-12-20 NOTE — Progress Notes (Deleted)
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  PRIOR THERAPY: 1) Status post right Pleurx catheter placement for drainage of recurrent right pleural effusion. 2) repeat biopsy on August 11, 2018 of the lung mass at [facility] and it showed atypical spindle cell proliferation. 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 4) restaging CT scan of the chest on September 14, 2019 showed new 1.5 cm soft tissue lesion along the right subpulmonic diaphragmatic pleural surface worrisome for tumor recurrence.  She was followed by observation by Dr. Angelina Ok at [place] [facility] 5) SBRT to the right lower lobe/diaphragmatic lesion completed June 22, 2020. 6) restaging scan on November 30, 2020 showed new right posterior pleural-based nodule followed by observation.  But repeat CT scan of the chest on 02/07/2021 showed enlarging right pleural mass/nodules. 7) the patient is started treatment with pazopanib on 02/17/2021 400 mg titrated to 800 mg p.o. daily but her treatment was held between July 8 to March 05, 2021 secondary to fatigue and arthralgia.  She resumed her treatment at a dose of 400 mg p.o. daily for 3 days titrated to 600 mg p.o. daily but this was again held in mid August 2022.  She resumed her treatment on 04/23/2021 at 400 mg p.o. daily but this was discontinued in October 2022 secondary to disease progression was a scan on 05/30/2021 revealing progressive  multifocal pleural-based disease.    CURRENT THERAPY:  Avastin and Temodar as recommended by Dr. Angelina Ok at [place] [facility]. First dose of temodar expected on 10/30/21. Temodar days 1-7, and 15-21 p.o. every 4 weeks with IV avastin 5 mg/kg on days 8 and 22 every 4 weeks. She missed day 8 cycle #1 due to issues with her port-a-cath. Status post 2 cycles   INTERVAL HISTORY: Brenda Patel 61 y.o. female returns to the clinic today for a follow up visit. She was last seen by Dr. Julien Nordmann on 12/10/21. She was having new onset itchiness/petechiae at that time. Her labs showed new onset thrombocytopenia with a platelet count of 42k at that time. It was thought this is secondary to temodar. Her treatment was held that day. Dr. Julien Nordmann prescribed a medrol dose pack.   She called a few days later on 4/21 endorsing a epistaxis not amenable to any interventions and lasting several hours. She was instructed to go to the ER where her platelet count was 18k. She received 1 unit of platelets.   She was seen on 12/19/21 for lab recheck. Her platelet count was <5 k. She was undergoing a platelet transfusion when she had an infusion reaction. She was subsequently sent to the ER.   She is here for lab recheck today. Her temodar has been on hold since ***. The patient is feeling well today except for petechiae and easy bruising. She denies anymore bleeding. She is here for evaluation and repeat blood work.   MEDICAL HISTORY: Past Medical History:  Diagnosis Date   Asthma    Cancer [facility])    Depression  Dyspnea    Pleural effusion on right    Pleural mass     ALLERGIES:  is allergic to cyanoacrylate, other, codeine, and lentil.  MEDICATIONS:  No current facility-administered medications for this visit.   No current outpatient medications on file.   Facility-Administered Medications Ordered in Other Visits  Medication Dose Route Frequency Provider Last Rate Last Admin   0.9 %  sodium chloride  infusion (Manually program via Guardrails IV Fluids)   Intravenous Once Maryanna Shape, NP       acetaminophen (TYLENOL) tablet 650 mg  650 mg Oral Q6H PRN Shela Leff, MD   650 mg at 12/20/21 1638   Or   acetaminophen (TYLENOL) suppository 650 mg  650 mg Rectal Q6H PRN Shela Leff, MD       acetaminophen (TYLENOL) tablet 650 mg  650 mg Oral Once Maryanna Shape, NP       albuterol (PROVENTIL) (2.5 MG/3ML) 0.083% nebulizer solution 2.5 mg  2.5 mg Nebulization Q4H PRN Shela Leff, MD       Chlorhexidine Gluconate Cloth 2 % PADS 6 each  6 each Topical Daily Shela Leff, MD       diphenhydrAMINE (BENADRYL) injection 50 mg  50 mg Intravenous Once Maryanna Shape, NP       famotidine (PEPCID) IVPB 20 mg premix  20 mg Intravenous Once Maryanna Shape, NP       methylPREDNISolone sodium succinate (SOLU-MEDROL) 125 mg/2 mL injection 125 mg  125 mg Intravenous Once Curcio, Erasmo Downer R, NP       montelukast (SINGULAIR) tablet 10 mg  10 mg Oral QPC supper Shela Leff, MD        SURGICAL HISTORY:  Past Surgical History:  Procedure Laterality Date   CHEST TUBE INSERTION Right 07/16/2018   Procedure: INSERTION PLEURAL DRAINAGE CATHETER, right;  Surgeon: Grace Isaac, MD;  Location: [place];  Service: Thoracic;  Laterality: Right;   COLONOSCOPY  2017   GUM SURGERY  1984   GRAFTS   IR IMAGING GUIDED PORT INSERTION  10/02/2021   IR RADIOLOGIST EVAL & MGMT  10/08/2021   IR RADIOLOGIST EVAL & MGMT  10/11/2021   IR REMOVAL TUN ACCESS W/ PORT W/O FL MOD SED  10/15/2021   IR THORACENTESIS ASP PLEURAL SPACE W/IMG GUIDE  07/03/2018    REVIEW OF SYSTEMS:   Review of Systems  Constitutional: Negative for appetite change, chills, fatigue, fever and unexpected weight change.  HENT:   Negative for mouth sores, nosebleeds, sore throat and trouble swallowing.   Eyes: Negative for eye problems and icterus.  Respiratory: Negative for cough, hemoptysis, shortness of breath and  wheezing.   Cardiovascular: Negative for chest pain and leg swelling.  Gastrointestinal: Negative for abdominal pain, constipation, diarrhea, nausea and vomiting.  Genitourinary: Negative for bladder incontinence, difficulty urinating, dysuria, frequency and hematuria.   Musculoskeletal: Negative for back pain, gait problem, neck pain and neck stiffness.  Skin: Negative for itching and rash.  Neurological: Negative for dizziness, extremity weakness, gait problem, headaches, light-headedness and seizures.  Hematological: Negative for adenopathy. Does not bruise/bleed easily.  Psychiatric/Behavioral: Negative for confusion, depression and sleep disturbance. The patient is not nervous/anxious.     PHYSICAL EXAMINATION:  There were no vitals taken for this visit.  ECOG PERFORMANCE STATUS: {CHL ONC ECOG [id number]  Physical Exam  Constitutional: Oriented to person, place, and time and well-developed, well-nourished, and in no distress. No distress.  HENT:  Head: Normocephalic and atraumatic.  Mouth/Throat: Oropharynx  is clear and moist. No oropharyngeal exudate.  Eyes: Conjunctivae are normal. Right eye exhibits no discharge. Left eye exhibits no discharge. No scleral icterus.  Neck: Normal range of motion. Neck supple.  Cardiovascular: Normal rate, regular rhythm, normal heart sounds and intact distal pulses.   Pulmonary/Chest: Effort normal and breath sounds normal. No respiratory distress. No wheezes. No rales.  Abdominal: Soft. Bowel sounds are normal. Exhibits no distension and no mass. There is no tenderness.  Musculoskeletal: Normal range of motion. Exhibits no edema.  Lymphadenopathy:    No cervical adenopathy.  Neurological: Alert and oriented to person, place, and time. Exhibits normal muscle tone. Gait normal. Coordination normal.  Skin: Skin is warm and dry. No rash noted. Not diaphoretic. No erythema. No pallor.  Psychiatric: Mood, memory and judgment normal.  Vitals  reviewed.  LABORATORY DATA: Lab Results  Component Value Date   WBC 3.9 (L) 12/20/2021   HGB 10.4 (L) 12/20/2021   HCT 31.5 (L) 12/20/2021   MCV 94.9 12/20/2021   PLT 9 (LL) 12/20/2021      Chemistry      Component Value Date/Time   NA 140 12/19/2021 1704   K 4.0 12/19/2021 1704   CL 112 (H) 12/19/2021 1704   CO2 22 12/19/2021 1704   BUN 15 12/19/2021 1704   CREATININE 0.73 12/19/2021 1704   CREATININE 0.83 12/10/2021 1328      Component Value Date/Time   CALCIUM 8.3 (L) 12/19/2021 1704   ALKPHOS 85 12/19/2021 1704   AST 26 12/19/2021 1704   AST 24 12/10/2021 1328   ALT 22 12/19/2021 1704   ALT 22 12/10/2021 1328   BILITOT 0.6 12/19/2021 1704   BILITOT 0.5 12/10/2021 1328       RADIOGRAPHIC STUDIES:  No results found.   ASSESSMENT/PLAN:  This is a very pleasant 61 years old Caucasian female presented with large right lower lobe lung mass in addition to right pleural effusion.  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 The patient was referred to Dr. Angelina Ok at [place] [facility] for second opinion and she underwent several studies and intervention as listed below  1) repeat biopsy on August 11, 2018 of the lung mass at [facility] and it showed atypical spindle cell proliferation. 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 3) restaging CT scan of the chest on September 14, 2019 showed new 1.5 cm soft tissue lesion along the right subpulmonic diaphragmatic pleural surface worrisome for tumor recurrence.  She was followed by observation by Dr. Angelina Ok at [place] [facility] 4) SBRT to the right lower lobe/diaphragmatic lesion completed June 22, 2020. 5) restaging scan on November 30, 2020 showed  new right posterior pleural-based nodule followed by observation.  But repeat CT scan of the chest on 02/07/2021 showed enlarging right pleural mass/nodules. 6) the patient is started treatment with pazopanib on 02/17/2021 400 mg titrated to 800 mg p.o. daily but her treatment was held between July 8 to March 05, 2021 secondary to fatigue and arthralgia.  She resumed her treatment at a dose of 400 mg p.o. daily for 3 days titrated to 600 mg p.o. daily but this was again held in mid August 2022.  She resumed her treatment on 04/23/2021 at 400 mg p.o. daily but this was discontinued  in October 2022 secondary to disease progression was a scan on 05/30/2021 revealing progressive multifocal pleural-based disease.. There was also a scan on 09/20/21 which showed progressive multifocal pleural based disease.   Dr. Angelina Ok recommended she consider treatment with Avastin and Temodar. She requested delaying starting treatment until after the holidays. She is on Avastin 5 mg/kg milligrams on days 8 and 22 every 4 weeks with Temodar p.o on days 1-7 and 15-21 every 4 weeks. She is status post 2 cycles.   Repeat scan***  She has been having significant thrombocytopenia requiring platelet transfusions.   Labs were reviewed which show ***. Recommend that she *** Premeds ***  Scans***.   The patient was advised to call immediately if she has any concerning symptoms in the interval. The patient voices understanding of current disease status and treatment options and is in agreement with the current care plan. All questions were answered. The patient knows to call the clinic with any problems, questions or concerns. We can certainly see the patient much sooner if necessary    No orders of the defined types were placed in this encounter.    I spent {CHL ONC TIME VISIT - JQBHA:[id number]} counseling the patient face to face. The total time spent in the appointment was {CHL ONC TIME VISIT - [id number]}.  Brenda Parady L  Andrika Peraza, PA-C 12/20/21

## 2021-12-20 NOTE — Progress Notes (Signed)
[name] ?OFFICE PROGRESS NOTE ? ?[name], FNP ?[address] [address] ? 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 ? ?PRIOR THERAPY: ?1) Status post right Pleurx catheter placement for drainage of recurrent right pleural effusion. ?2) repeat biopsy on August 11, 2018 of the lung mass at [facility] and it showed atypical spindle cell proliferation. ?3) on September 23, 2018 she underwent bilateral transexternal thoracotomies for resection of the right pleural-based tumor with en bloc right lower lobe wedge resection and the pathology revealed 18 cm solitary fibrous tumor with negative margins. ?4) restaging CT scan of the chest on September 14, 2019 showed new 1.5 cm soft tissue lesion along the right subpulmonic diaphragmatic pleural surface worrisome for tumor recurrence.  She was followed by observation by Dr. Angelina Ok at [place] [facility] ?5) SBRT to the right lower lobe/diaphragmatic lesion completed June 22, 2020. ?6) restaging scan on November 30, 2020 showed new right posterior pleural-based nodule followed by observation.  But repeat CT scan of the chest on 02/07/2021 showed enlarging right pleural mass/nodules. ?7) the patient is started treatment with pazopanib on 02/17/2021 400 mg titrated to 800 mg p.o. daily but her treatment was held between July 8 to March 05, 2021 secondary to fatigue and arthralgia.  She resumed her treatment at a dose of 400 mg p.o. daily for 3 days titrated to 600 mg p.o. daily but this was again held in mid August 2022.  She resumed her treatment on 04/23/2021 at 400 mg p.o. daily but this was discontinued in October 2022 secondary to disease progression was a scan on 05/30/2021 revealing progressive multifocal pleural-based disease. ? ?CURRENT THERAPY: CURRENT THERAPY:  Avastin and Temodar as  recommended by Dr. Angelina Ok at [place] [facility]. ?First dose of temodar expected on 10/30/21. Temodar days 1-7, and 15-21 p.o. every 4 weeks with IV avastin 5 mg/kg on days 8 and 22 every 4 weeks. She missed day 8 cycle #1 due to issues with her port-a-cath. Status post 2 cycles  ? ?INTERVAL HISTORY: ?Brenda Patel 61 y.o. female returns to the clinic today for a follow up visit. She was last seen by Dr. Julien Nordmann on 12/10/21. She was having new onset itchiness/petechiae at that time. Her labs showed new onset thrombocytopenia with a platelet count of 42k at that time. It was thought this is secondary to temodar. Her treatment was held that day. Dr. Julien Nordmann prescribed a medrol dose pack. Her last dose of temodar was on 12/09/21.  ? ?She called a few days later on 4/21 endorsing a epistaxis not amenable to any interventions and lasting several hours. She was instructed to go to the ER where her platelet count was 18k. She received 1 unit of platelets.  ? ?She was seen on 12/19/21 for lab recheck. Her platelet count was <5 k. She was undergoing a platelet transfusion when she had a significant infusion reaction. She was subsequently sent to the ER. She was admitted from 4/26-4/28. She had some epistaxis while admitted. She received 2 units of platelets. Her platelets were run over 4 hours. She also had premedications with pepcid 20 mg, Solumedrol 125 mg, benadryl 50 mg, and tylenol 650 mg.  ? ?She is here for lab recheck today. Otherwise, she denies anymore bleeding. She continues to have petechiae and easy bruising. She reports she feels weaker from being sedentary  the last few days. Denies any shortness of breath, cough, or chest pain. She is scheduled for labs, repeat scan, and follow up with Dr. Angelina Ok at [facility] later this week on 12/27/21.  ? ? ? ?MEDICAL HISTORY: ?Past Medical History:  ?Diagnosis Date  ? Asthma   ? Cancer Brenda Patel)   ? Depression   ? Dyspnea   ? Pleural effusion on right   ? Pleural mass    ? ? ?ALLERGIES:  is allergic to cyanoacrylate, other, codeine, and lentil. ? ?MEDICATIONS:  ?Current Outpatient Medications  ?Medication Sig Dispense Refill  ? Calcium Citrate 333 MG TABS Take 333 mg by mouth daily.    ? [employer] 12 HOUR NASAL DECONGESTANT 120 MG 12 hr tablet Take 120 mg by mouth daily.  10  ? fluticasone (FLONASE) 50 MCG/ACT nasal spray Place 1 spray into both nostrils daily as needed for allergies.  5  ? meloxicam (MOBIC) 15 MG tablet Take 15 mg by mouth daily as needed for pain.     ? montelukast (SINGULAIR) 10 MG tablet Take 10 mg by mouth daily after supper.  10  ? ondansetron (ZOFRAN) 8 MG tablet TAKE 1 TABLET BY MOUTH EVERY 8 HOURS AS NEEDED FOR NAUSEA OR VOMITING. (Patient taking differently: Take 8 mg by mouth every 8 (eight) hours as needed for nausea or vomiting.) 18 tablet 1  ? temozolomide (TEMODAR) 140 MG capsule Take 2 capsules (280 mg total) by mouth daily. Take on days 1-7 and days 15-21 of each 28 day cycle. May take on an empty stomach or at bedtime to decrease nausea & vomiting. 28 capsule 3  ? ?No current facility-administered medications for this visit.  ? ? ?SURGICAL HISTORY:  ?Past Surgical History:  ?Procedure Laterality Date  ? CHEST TUBE INSERTION Right 07/16/2018  ? Procedure: INSERTION PLEURAL DRAINAGE CATHETER, right;  Surgeon: Grace Isaac, MD;  Location: [place];  Service: Thoracic;  Laterality: Right;  ? COLONOSCOPY  2017  ? [place]  ? GRAFTS  ? IR IMAGING GUIDED PORT INSERTION  10/02/2021  ? IR RADIOLOGIST EVAL & MGMT  10/08/2021  ? IR RADIOLOGIST EVAL & MGMT  10/11/2021  ? IR REMOVAL TUN ACCESS W/ PORT W/O FL MOD SED  10/15/2021  ? IR THORACENTESIS ASP PLEURAL SPACE W/IMG GUIDE  07/03/2018  ? ? ?REVIEW OF SYSTEMS:   ?Review of Systems  ?Constitutional: Positive for generalized weakness.  Negative for appetite change, chills, fever and unexpected weight change.  ?HENT:   Negative for mouth sores, nosebleeds, sore throat and trouble swallowing.   ?Eyes: Negative  for eye problems and icterus.  ?Respiratory: Negative for cough, hemoptysis, shortness of breath and wheezing.   ?Cardiovascular: Negative for chest pain and leg swelling.  ?Gastrointestinal: Negative for abdominal pain, constipation, diarrhea, nausea and vomiting.  ?Genitourinary: Negative for bladder incontinence, difficulty urinating, dysuria, frequency and hematuria.   ?Musculoskeletal: Negative for back pain, gait problem, neck pain and neck stiffness.  ?Skin: Negative for itching and rash.  ?Neurological: Negative for dizziness, extremity weakness, gait problem, headaches, light-headedness and seizures.  ?Hematological: Negative for adenopathy.  Negative for bleeding.  Positive for petechiae and bruising across chest, abdomen, and extremities ?Psychiatric/Behavioral: Negative for confusion, depression and sleep disturbance. The patient is not nervous/anxious.   ? ? ?PHYSICAL EXAMINATION:  ?Blood pressure 139/78, pulse (!) 123, temperature 99 ?F (37.2 ?C), temperature source Tympanic, resp. rate 17, weight 156 lb 12.8 oz (71.1 kg), SpO2 99 %. ? ?ECOG PERFORMANCE STATUS: 1 ? ?  Physical Exam  ?Constitutional: Oriented to person, place, and time and well-developed, well-nourished, and in no distress.  ?HENT:  ?Head: Normocephalic and atraumatic.  ?Mouth/Throat: Oropharynx is clear and moist. No oropharyngeal exudate.  ?Eyes: Conjunctivae are normal. Right eye exhibits no discharge. Left eye exhibits no discharge. No scleral icterus.  ?Neck: Normal range of motion. Neck supple.  ?Cardiovascular: Normal rate, regular rhythm, normal heart sounds and intact distal pulses.   ?Pulmonary/Chest: Effort normal and breath sounds normal. No respiratory distress. No wheezes. No rales.  ?Abdominal: Soft. Bowel sounds are normal. Exhibits no distension and no mass. There is no tenderness.  ?Musculoskeletal: Normal range of motion. Exhibits no edema.  ?Lymphadenopathy:  ?  No cervical adenopathy.  ?Neurological: Alert and  oriented to person, place, and time. Exhibits normal muscle tone. Gait normal. Coordination normal.  ?Skin: Bruising/petechiae. See pictures below.  ? ? ? ? ? ? Skin is warm and dry. No rash noted. Not diaphoretic. No eryt

## 2021-12-20 NOTE — Progress Notes (Addendum)
Due to recent transfusion reaction with platelets, verbal orders received from Mikey Bussing, NP to initiate transfusion at 24ml/hr and advance as tolerated to goal of 189ml/hr.  ? ?

## 2021-12-21 ENCOUNTER — Inpatient Hospital Stay: Payer: BC Managed Care – PPO | Admitting: Physician Assistant

## 2021-12-21 ENCOUNTER — Inpatient Hospital Stay: Payer: BC Managed Care – PPO

## 2021-12-21 ENCOUNTER — Other Ambulatory Visit (HOSPITAL_COMMUNITY): Payer: Self-pay

## 2021-12-21 DIAGNOSIS — D696 Thrombocytopenia, unspecified: Secondary | ICD-10-CM | POA: Diagnosis not present

## 2021-12-21 DIAGNOSIS — T8092XD Unspecified transfusion reaction, subsequent encounter: Secondary | ICD-10-CM | POA: Diagnosis not present

## 2021-12-21 DIAGNOSIS — C3491 Malignant neoplasm of unspecified part of right bronchus or lung: Secondary | ICD-10-CM | POA: Diagnosis not present

## 2021-12-21 LAB — PREPARE PLATELET PHERESIS: Unit division: 0

## 2021-12-21 LAB — CBC
HCT: 29.2 % — ABNORMAL LOW (ref 36.0–46.0)
Hemoglobin: 9.8 g/dL — ABNORMAL LOW (ref 12.0–15.0)
MCH: 31.9 pg (ref 26.0–34.0)
MCHC: 33.6 g/dL (ref 30.0–36.0)
MCV: 95.1 fL (ref 80.0–100.0)
Platelets: 23 10*3/uL — CL (ref 150–400)
RBC: 3.07 MIL/uL — ABNORMAL LOW (ref 3.87–5.11)
RDW: 17.5 % — ABNORMAL HIGH (ref 11.5–15.5)
WBC: 10.9 10*3/uL — ABNORMAL HIGH (ref 4.0–10.5)
nRBC: 0 % (ref 0.0–0.2)

## 2021-12-21 LAB — BPAM PLATELET PHERESIS
Blood Product Expiration Date: 202304292359
ISSUE DATE / TIME: 202304271530
Unit Type and Rh: 5100

## 2021-12-21 LAB — URINALYSIS, ROUTINE W REFLEX MICROSCOPIC
Bilirubin Urine: NEGATIVE
Glucose, UA: NEGATIVE mg/dL
Hgb urine dipstick: NEGATIVE
Ketones, ur: NEGATIVE mg/dL
Leukocytes,Ua: NEGATIVE
Nitrite: NEGATIVE
Protein, ur: NEGATIVE mg/dL
Specific Gravity, Urine: <1.005 — ABNORMAL LOW (ref 1.005–1.030)
pH: 6 (ref 5.0–8.0)

## 2021-12-21 MED ORDER — ACETAMINOPHEN 325 MG PO TABS
650.0000 mg | ORAL_TABLET | Freq: Once | ORAL | Status: AC
  Administered 2021-12-21: 650 mg via ORAL
  Filled 2021-12-21: qty 2

## 2021-12-21 MED ORDER — DIPHENHYDRAMINE HCL 50 MG/ML IJ SOLN
50.0000 mg | Freq: Once | INTRAMUSCULAR | Status: AC
  Administered 2021-12-21: 50 mg via INTRAVENOUS
  Filled 2021-12-21: qty 1

## 2021-12-21 MED ORDER — METHYLPREDNISOLONE SODIUM SUCC 125 MG IJ SOLR
125.0000 mg | Freq: Once | INTRAMUSCULAR | Status: AC
  Administered 2021-12-21: 125 mg via INTRAVENOUS
  Filled 2021-12-21: qty 2

## 2021-12-21 MED ORDER — SODIUM CHLORIDE 0.9% IV SOLUTION: Freq: Once | INTRAVENOUS | Status: AC

## 2021-12-21 MED ORDER — FAMOTIDINE IN NACL 20-0.9 MG/50ML-% IV SOLN
20.0000 mg | Freq: Once | INTRAVENOUS | Status: AC
  Administered 2021-12-21: 20 mg via INTRAVENOUS
  Filled 2021-12-21: qty 50

## 2021-12-21 NOTE — Progress Notes (Signed)
HEMATOLOGY-ONCOLOGY PROGRESS NOTE ? ?ASSESSMENT AND PLAN: ?This is a very pleasant 61 year old white female presented with large right lower lobe lung mass in addition to right pleural effusion.  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 ?The patient was referred to Dr. Angelina Ok at [facility] for second opinion and she underwent several studies and intervention as listed below ?1) repeat biopsy on August 11, 2018 of the lung mass at [facility] and it showed atypical spindle cell proliferation. ?2) on September 23, 2018 she underwent bilateral transexternal thoracotomies for resection of the right pleural-based tumor with en bloc right lower lobe wedge resection and the pathology revealed 18 cm solitary fibrous tumor with negative margins. ?3) restaging CT scan of the chest on September 14, 2019 showed new 1.5 cm soft tissue lesion along the right subpulmonic diaphragmatic pleural surface worrisome for tumor recurrence.  She was followed by observation by Dr. Angelina Ok at [facility] ?4) SBRT to the right lower lobe/diaphragmatic lesion completed June 22, 2020. ?5) restaging scan on November 30, 2020 showed new right posterior pleural-based nodule followed by observation.  But repeat CT scan of the chest on 02/07/2021 showed enlarging right pleural mass/nodules. ?6) the patient was started treatment with pazopanib on 02/17/2021 400 mg titrated to 800 mg p.o. daily but her treatment was held between July 8 to March 05, 2021 secondary to fatigue and arthralgia.  She resumed her treatment at a dose of 400 mg p.o. daily for 3 days titrated to 600 mg p.o. daily but this was again held in mid August 2022.  She resumed her treatment on 04/23/2021 at 400 mg p.o. daily but this was discontinued in October 2022 secondary to disease progression was a scan on 05/30/2021 revealing progressive multifocal pleural-based disease. ?Dr. Angelina Ok recommended  for the patient treatment with Temodar and Avastin.  She started Temodar 150 Mg/M2 on days 1 and 7 as well as day 15-21 in addition to Avastin 5 Mg/KG on days 1 and 22 every 4 weeks.  She is status post 2 cycles.   ? ?She has been tolerating this treatment well overall but has recently developed thrombocytopenia.  She has been receiving platelet transfusions in our office but developed transfusion reaction during a platelet transfusion on 12/19/2021.  She was transferred to the emergency department for further evaluation and for platelet transfusion.  She did not receive any platelets as recommended in the ED. ? ?She received 1 unit of platelets on 12/20/2021 which were given over 4 hours with premedications.  She tolerated well.  Platelet count is up to 23,000 this morning.  She is stable for discharge from our standpoint.  However, given that it is the weekend, we recommend 1 unit of platelets prior to discharge today.  I have again ordered 1 unit of platelets with premedications including Tylenol 600 mg p.o., Benadryl 50 mg IV, Solu-Medrol 125 mg IV, and Pepcid 20 mg IV.  We will again run the platelets over 4 hours.  Once her platelets are completed, she may discharge home.  I have communicated this information to the hospitalist. ? ?Continue to hold Temodar.  She has outpatient follow-up already scheduled in our office on Monday, 12/24/2021.  We will recheck her labs, see her, and she has an infusion appointment for platelet transfusion if needed. ? ?Mikey Bussing, DNP, AGPCNP-BC, AOCNP ? ?SUBJECTIVE: Received 1 unit of platelets yesterday over 4 hours and tolerated well.  She was  given premedications including Tylenol, Benadryl, Solu-Medrol, Pepcid.  Platelets up to 23,000 this morning.  No active bleeding reported.  Has ongoing petechiae. ? ?REVIEW OF SYSTEMS:   ?Review of Systems  ?Constitutional:  Negative for chills and fever.  ?HENT: Negative.    ?Respiratory: Negative.    ?Cardiovascular: Negative.    ?Gastrointestinal:  Negative for blood in stool and melena.  ?Genitourinary:  Negative for hematuria.  ?Neurological: Negative.   ?Psychiatric/Behavioral: Negative.    ? ?I have reviewed the past medical history, past surgical history, social history and family history with the patient and they are unchanged from previous note. ? ? ?PHYSICAL EXAMINATION: ?ECOG PERFORMANCE STATUS: 1 - Symptomatic but completely ambulatory ? ?Vitals:  ? 12/21/21 0738 12/21/21 0902  ?BP:  (!) 174/81  ?Pulse:  93  ?Resp:  (!) 21  ?Temp: (!) 96.8 ?F (36 ?C)   ?SpO2:  99%  ? ?Filed Weights  ? 12/19/21 2144  ?Weight: 71.5 kg  ? ? ?Intake/Output from previous day: ?04/27 0701 - 04/28 0700 ?In: 1378.2 [P.O.:960; I.V.:3.7; Blood:367.9; IV Piggyback:46.6] ?Out: 1410 [Urine:1650] ? ?Physical Exam ?Vitals reviewed.  ?Constitutional:   ?   General: She is not in acute distress. ?HENT:  ?   Head: Normocephalic.  ?   Mouth/Throat:  ?   Comments: No petechiae in the oropharynx ?Pulmonary:  ?   Effort: Pulmonary effort is normal. No respiratory distress.  ?Skin: ?   General: Skin is warm and dry.  ?   Findings: Bruising present.  ?   Comments: Petechiae noted over her legs, arms, chest  ?Neurological:  ?   Mental Status: She is alert and oriented to person, place, and time.  ? ? ?LABORATORY DATA:  ?I have reviewed the data as listed ? ?  Latest Ref Rng & Units 12/19/2021  ?  5:04 PM 12/14/2021  ? 11:01 AM 12/10/2021  ?  1:28 PM  ?CMP  ?Glucose 70 - 99 mg/dL 202   98   108    ?BUN 6 - 20 mg/dL 15   18   22     ?Creatinine 0.44 - 1.00 mg/dL 0.73   0.59   0.83    ?Sodium 135 - 145 mmol/L 140   141   140    ?Potassium 3.5 - 5.1 mmol/L 4.0   3.7   3.8    ?Chloride 98 - 111 mmol/L 112   105   103    ?CO2 22 - 32 mmol/L 22   28   31     ?Calcium 8.9 - 10.3 mg/dL 8.3   9.8   9.8    ?Total Protein 6.5 - 8.1 g/dL 6.6   7.8   7.4    ?Total Bilirubin 0.3 - 1.2 mg/dL 0.6   0.8   0.5    ?Alkaline Phos 38 - 126 U/L 85   89   90    ?AST 15 - 41 U/L 26   23   24     ?ALT  0 - 44 U/L 22   20   22     ? ? ?Lab Results  ?Component Value Date  ? WBC 10.9 (H) 12/21/2021  ? HGB 9.8 (L) 12/21/2021  ? HCT 29.2 (L) 12/21/2021  ? MCV 95.1 12/21/2021  ? PLT 23 (LL) 12/21/2021  ? NEUTROABS 5.8 12/19/2021  ? ? ?No results found for: CEA1, CEA, [id number], CA125, PSA1 ? ?No results found. ? ? ?Future Appointments  ?Date Time Provider [place]  ?12/24/2021  9:30 AM [facility] LAB [facility] None  ?12/24/2021 10:00 AM Heilingoetter, Cassandra L, PA-C [facility] None  ?12/24/2021 11:00 AM [facility] INFUSION [facility] None  ?01/08/2022  1:00 PM [facility] LAB [facility] None  ?01/08/2022  1:45 PM Curt Bears, MD [facility] None  ?01/08/2022  2:30 PM [facility] INFUSION [facility] None  ?01/22/2022  7:30 AM [facility] LAB [facility] None  ?01/22/2022  8:00 AM Heilingoetter, Cassandra L, PA-C [facility] None  ?01/22/2022  9:00 AM [facility] INFUSION [facility] None  ?  ? ? LOS: 1 day  ?

## 2021-12-21 NOTE — Discharge Summary (Signed)
Physician Discharge Summary  ?Brenda Patel MWU:[id number] DOB: 09-Oct-1960 DOA: 12/19/2021 ? ?PCP: [name], FNP ? ?Admit date: 12/19/2021 ?Discharge date: 12/21/2021 ? ?Admitted From: Home ?Disposition:  Home ? ?Discharge Condition:Stable ?CODE STATUS:FULL ?Diet recommendation:  Regular ? ?Brief/Interim Summary: ? 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  Patient developed allergic reaction after she was transfused with platelets: Developed pruritus, hot flash, rash.  Transfusion was stopped and was given Benadryl, Solu-Medrol and Pepcid.  Also developed mild nasal bleeding.  Platelet count was in the range of 9K on admission, oncology were following.  Epistaxis resolved after admission.  She was transfused with platelets during this hospitalization with improvement in the platelet level.  She will be transfused with an additional 1 unit today.  Oncology cleared her for discharge.  She needs to check her platelets level in a week and follow-up with oncology.  Medically stable for discharge. ? ?Following problems were addressed during her hospitalization: ? ?Severe thrombocytopenia/platelet transfusion reaction: Thrombocytopenia is most likely secondary to chemotherapy.  Found to have platelets level less than 5000 in the oncology office.  Given platelet transfusion with development of pruritus, hot flashes, rash.  Transfusion stopped, given Benadryl, Solu-Medrol, Pepcid.  Had an episode of mild epistaxis at oncology office but not at this time.  ?She was transfused with platelets during this hospitalization with improvement in the platelet level.  She will be transfused with an additional 1 unit today.  Oncology cleared her for discharge.  She needs to check her platelets  level in a week and follow-up with oncology.  ? ?Normocytic anemia/leukopenia: Hemoglobin in the range of 10.  This is also likely associated with chemotherapy.Monitor as an outpatient ?  ?Sarcomatoid carcinoma of right lung: Status post 2 cycles of Temodar, Avastin.  Currently chemotherapy on hold due to significant thrombocytopenia. ?  ?Hyperglycemia: No history of diabetes.  A1c of 6.  Likely triggered by steroids. Stable ?  ?Asthma: Currently without exacerbation.  On Singulair.  ? ?Discharge Diagnoses:  ?Principal Problem: ?  Transfusion reaction ?Active Problems: ?  Sarcomatoid carcinoma of lung, right ([place]) ?  Thrombocytopenia ([place]) ?  Asthma ?  Hyperglycemia ? ? ? ?Discharge Instructions ? ?Discharge Instructions   ? ? Diet general   Complete by: As directed ?  ? Discharge instructions   Complete by: As directed ?  ? 1)Please follow-up with your oncologist in a week.  Do a CBC test to check your platelets level in a week  ? Increase activity slowly   Complete by: As directed ?  ? ?  ? ?Allergies as of 12/21/2021   ? ?   Reactions  ? Cyanoacrylate Dermatitis  ? Dermabond Topical Skin Adhesive  ? Other Dermatitis  ? Bard Kellogg  ? Codeine Nausea Only, Other (See Comments)  ? Severe constipation, also  ? Lentil Other (See Comments)  ? [place] [facility] tested.  ? ?  ? ?  ?Medication List  ?  ? ?TAKE these medications   ? ?Calcium Citrate 333 MG Tabs ?Take 333 mg by mouth daily. ?  ?[employer] 12 Hour Nasal Decongestant 120 MG 12 hr tablet ?Generic drug: pseudoephedrine ?Take 120 mg by mouth daily. ?  ?fluticasone 50 MCG/ACT nasal spray ?Commonly known as: FLONASE ?Place 1 spray into both nostrils daily as needed for  allergies. ?  ?meloxicam 15 MG tablet ?Commonly known as: MOBIC ?Take 15 mg by mouth daily as needed for pain. ?  ?methylPREDNISolone 4 MG Tbpk tablet ?Commonly known as: MEDROL DOSEPAK ?Use as instructed. ?  ?montelukast 10 MG tablet ?Commonly known as: SINGULAIR ?Take 10 mg by mouth daily after  supper. ?  ?ondansetron 8 MG tablet ?Commonly known as: ZOFRAN ?TAKE 1 TABLET BY MOUTH EVERY 8 HOURS AS NEEDED FOR NAUSEA OR VOMITING. ?What changed: reasons to take this ?  ?temozolomide 140 MG capsule ?Commonly known as: TEMODAR ?Take 2 capsules (280 mg total) by mouth daily. Take on days 1-7 and days 15-21 of each 28 day cycle. May take on an empty stomach or at bedtime to decrease nausea & vomiting. ?  ? ?  ? ? ?Allergies  ?Allergen Reactions  ? Cyanoacrylate Dermatitis  ?  Dermabond Topical Skin Adhesive  ? Other Dermatitis  ?  Bard Kellogg  ? Codeine Nausea Only and Other (See Comments)  ?  Severe constipation, also  ? Lentil Other (See Comments)  ?  [place] [facility] tested.  ? ? ?Consultations: ?Oncology ? ? ?Procedures/Studies: ?No results found. ? ? ? ?Subjective: ?Patient seen and examined at bedside this morning.  Hemodynamically stable for discharge today.  No new complaints ? ?Discharge Exam: ?Vitals:  ? 12/21/21 0738 12/21/21 0902  ?BP:  (!) 174/81  ?Pulse:  93  ?Resp:  (!) 21  ?Temp: (!) 96.8 ?F (36 ?C)   ?SpO2:  99%  ? ?Vitals:  ? 12/21/21 0520 12/21/21 0530 12/21/21 5093 12/21/21 0902  ?BP:    (!) 174/81  ?Pulse: 88 73  93  ?Resp: 16 14  (!) 21  ?Temp:   (!) 96.8 ?F (36 ?C)   ?TempSrc:   Axillary   ?SpO2: 95% 99%  99%  ?Weight:      ?Height:      ? ? ?General: Pt is alert, awake, not in acute distress ?Cardiovascular: RRR, S1/S2 +, no rubs, no gallops ?Respiratory: CTA bilaterally, no wheezing, no rhonchi ?Abdominal: Soft, NT, ND, bowel sounds + ?Extremities: no edema, no cyanosis ? ? ? ?The results of significant diagnostics from this hospitalization (including imaging, microbiology, ancillary and laboratory) are listed below for reference.   ? ? ?Microbiology: ?No results found for this or any previous visit (from the past 240 hour(s)).  ? ?Labs: ?BNP (last 3 results) ?No results for input(s): BNP in the last 8760 hours. ?Basic Metabolic Panel: ?Recent Labs  ?Lab 12/14/21 ?1101  12/19/21 ?1704  ?NA 141 140  ?K 3.7 4.0  ?CL 105 112*  ?CO2 28 22  ?GLUCOSE 98 202*  ?BUN 18 15  ?CREATININE 0.59 0.73  ?CALCIUM 9.8 8.3*  ? ?Liver Function Tests: ?Recent Labs  ?Lab 12/14/21 ?1101 12/19/21 ?1704  ?AST 23 26  ?ALT 20 22  ?ALKPHOS 89 85  ?BILITOT 0.8 0.6  ?PROT 7.8 6.6  ?ALBUMIN 4.5 3.6  ? ?No results for input(s): LIPASE, AMYLASE in the last 168 hours. ?No results for input(s): AMMONIA in the last 168 hours. ?CBC: ?Recent Labs  ?Lab 12/14/21 ?1101 12/19/21 ?0854 12/19/21 ?1704 12/20/21 ?0230 12/21/21 ?0232  ?WBC 8.1 4.5 6.2 3.9* 10.9*  ?NEUTROABS 6.8 3.3 5.8  --   --   ?HGB 12.8 11.4* 11.2* 10.4* 9.8*  ?HCT 38.8 34.5* 32.8* 31.5* 29.2*  ?MCV 94.4 93.8 94.5 94.9 95.1  ?PLT 18* <5* 9* 9* 23*  ? ?Cardiac Enzymes: ?No results for input(s): CKTOTAL, CKMB, CKMBINDEX,  TROPONINI in the last 168 hours. ?BNP: ?Invalid input(s): POCBNP ?CBG: ?No results for input(s): GLUCAP in the last 168 hours. ?D-Dimer ?No results for input(s): DDIMER in the last 72 hours. ?Hgb A1c ?Recent Labs  ?  12/20/21 ?0230  ?HGBA1C 6.0*  ? ?Lipid Profile ?No results for input(s): CHOL, HDL, LDLCALC, TRIG, CHOLHDL, LDLDIRECT in the last 72 hours. ?Thyroid function studies ?No results for input(s): TSH, T4TOTAL, T3FREE, THYROIDAB in the last 72 hours. ? ?Invalid input(s): FREET3 ?Anemia work up ?No results for input(s): VITAMINB12, FOLATE, FERRITIN, TIBC, IRON, RETICCTPCT in the last 72 hours. ?Urinalysis ?   ?Component Value Date/Time  ? COLORURINE YELLOW 10/21/2019 0334  ? APPEARANCEUR CLOUDY (A) 10/21/2019 0334  ? LABSPEC 1.014 10/21/2019 0334  ? PHURINE 8.0 10/21/2019 0334  ? GLUCOSEU NEGATIVE 10/21/2019 0334  ? [place] NEGATIVE 10/21/2019 0334  ? [place] NEGATIVE 10/21/2019 0334  ? KETONESUR 5 (A) 10/21/2019 0334  ? PROTEINUR <30 (A) 12/10/2021 1330  ? NITRITE NEGATIVE 10/21/2019 0334  ? LEUKOCYTESUR TRACE (A) 10/21/2019 0334  ? ?Sepsis Labs ?Invalid input(s): PROCALCITONIN,  WBC,  LACTICIDVEN ?Microbiology ?No results found for  this or any previous visit (from the past 240 hour(s)). ? ?Please note: ?You were cared for by a hospitalist during your hospital stay. Once you are discharged, your primary care physician will handle any further medic

## 2021-12-21 NOTE — Plan of Care (Signed)

## 2021-12-22 LAB — BPAM PLATELET PHERESIS
Blood Product Expiration Date: 202304302359
ISSUE DATE / TIME: 202304281045
Unit Type and Rh: 9500

## 2021-12-22 LAB — PREPARE PLATELET PHERESIS: Unit division: 0

## 2021-12-24 ENCOUNTER — Inpatient Hospital Stay (HOSPITAL_BASED_OUTPATIENT_CLINIC_OR_DEPARTMENT_OTHER): Payer: BC Managed Care – PPO | Admitting: Physician Assistant

## 2021-12-24 ENCOUNTER — Other Ambulatory Visit: Payer: BC Managed Care – PPO

## 2021-12-24 ENCOUNTER — Ambulatory Visit: Payer: BC Managed Care – PPO | Admitting: Physician Assistant

## 2021-12-24 ENCOUNTER — Ambulatory Visit: Payer: BC Managed Care – PPO

## 2021-12-24 ENCOUNTER — Other Ambulatory Visit: Payer: Self-pay

## 2021-12-24 ENCOUNTER — Inpatient Hospital Stay: Payer: BC Managed Care – PPO

## 2021-12-24 ENCOUNTER — Inpatient Hospital Stay: Payer: BC Managed Care – PPO | Attending: Internal Medicine

## 2021-12-24 ENCOUNTER — Other Ambulatory Visit: Payer: Self-pay | Admitting: Physician Assistant

## 2021-12-24 VITALS — BP 139/78 | HR 123 | Temp 99.0°F | Resp 17 | Wt 156.8 lb

## 2021-12-24 DIAGNOSIS — R531 Weakness: Secondary | ICD-10-CM | POA: Insufficient documentation

## 2021-12-24 DIAGNOSIS — C3431 Malignant neoplasm of lower lobe, right bronchus or lung: Secondary | ICD-10-CM | POA: Insufficient documentation

## 2021-12-24 DIAGNOSIS — Z79899 Other long term (current) drug therapy: Secondary | ICD-10-CM | POA: Insufficient documentation

## 2021-12-24 DIAGNOSIS — D696 Thrombocytopenia, unspecified: Secondary | ICD-10-CM

## 2021-12-24 DIAGNOSIS — C3491 Malignant neoplasm of unspecified part of right bronchus or lung: Secondary | ICD-10-CM

## 2021-12-24 DIAGNOSIS — J45909 Unspecified asthma, uncomplicated: Secondary | ICD-10-CM | POA: Diagnosis not present

## 2021-12-24 DIAGNOSIS — D709 Neutropenia, unspecified: Secondary | ICD-10-CM | POA: Diagnosis not present

## 2021-12-24 DIAGNOSIS — D61810 Antineoplastic chemotherapy induced pancytopenia: Secondary | ICD-10-CM | POA: Insufficient documentation

## 2021-12-24 DIAGNOSIS — T8089XS Other complications following infusion, transfusion and therapeutic injection, sequela: Secondary | ICD-10-CM | POA: Diagnosis not present

## 2021-12-24 DIAGNOSIS — Z5112 Encounter for antineoplastic immunotherapy: Secondary | ICD-10-CM | POA: Diagnosis present

## 2021-12-24 DIAGNOSIS — D649 Anemia, unspecified: Secondary | ICD-10-CM | POA: Diagnosis not present

## 2021-12-24 DIAGNOSIS — T451X5A Adverse effect of antineoplastic and immunosuppressive drugs, initial encounter: Secondary | ICD-10-CM | POA: Insufficient documentation

## 2021-12-24 DIAGNOSIS — D492 Neoplasm of unspecified behavior of bone, soft tissue, and skin: Secondary | ICD-10-CM

## 2021-12-24 LAB — CBC WITH DIFFERENTIAL (CANCER CENTER ONLY)
Abs Immature Granulocytes: 0.01 10*3/uL (ref 0.00–0.07)
Basophils Absolute: 0 10*3/uL (ref 0.0–0.1)
Basophils Relative: 0 %
Eosinophils Absolute: 0.1 10*3/uL (ref 0.0–0.5)
Eosinophils Relative: 3 %
HCT: 31 % — ABNORMAL LOW (ref 36.0–46.0)
Hemoglobin: 10.7 g/dL — ABNORMAL LOW (ref 12.0–15.0)
Immature Granulocytes: 0 %
Lymphocytes Relative: 7 %
Lymphs Abs: 0.2 10*3/uL — ABNORMAL LOW (ref 0.7–4.0)
MCH: 32 pg (ref 26.0–34.0)
MCHC: 34.5 g/dL (ref 30.0–36.0)
MCV: 92.8 fL (ref 80.0–100.0)
Monocytes Absolute: 0.1 10*3/uL (ref 0.1–1.0)
Monocytes Relative: 3 %
Neutro Abs: 2.9 10*3/uL (ref 1.7–7.7)
Neutrophils Relative %: 87 %
Platelet Count: 25 10*3/uL — ABNORMAL LOW (ref 150–400)
RBC: 3.34 MIL/uL — ABNORMAL LOW (ref 3.87–5.11)
RDW: 17.3 % — ABNORMAL HIGH (ref 11.5–15.5)
WBC Count: 3.3 10*3/uL — ABNORMAL LOW (ref 4.0–10.5)
nRBC: 0 % (ref 0.0–0.2)

## 2021-12-24 LAB — CMP (CANCER CENTER ONLY)
ALT: 19 U/L (ref 0–44)
AST: 18 U/L (ref 15–41)
Albumin: 4 g/dL (ref 3.5–5.0)
Alkaline Phosphatase: 85 U/L (ref 38–126)
Anion gap: 6 (ref 5–15)
BUN: 17 mg/dL (ref 6–20)
CO2: 32 mmol/L (ref 22–32)
Calcium: 9.3 mg/dL (ref 8.9–10.3)
Chloride: 102 mmol/L (ref 98–111)
Creatinine: 0.83 mg/dL (ref 0.44–1.00)
GFR, Estimated: >60 mL/min (ref >60)
Glucose, Bld: 112 mg/dL — ABNORMAL HIGH (ref 70–99)
Potassium: 3.8 mmol/L (ref 3.5–5.1)
Sodium: 140 mmol/L (ref 135–145)
Total Bilirubin: 0.5 mg/dL (ref 0.3–1.2)
Total Protein: 7.1 g/dL (ref 6.5–8.1)

## 2021-12-24 LAB — TOTAL PROTEIN, URINE DIPSTICK: Protein, ur: 300 mg/dL — AB

## 2021-12-24 LAB — SAMPLE TO BLOOD BANK

## 2021-12-24 MED ORDER — METHYLPREDNISOLONE SODIUM SUCC 125 MG IJ SOLR
125.0000 mg | Freq: Once | INTRAMUSCULAR | Status: AC
  Administered 2021-12-24: 125 mg via INTRAVENOUS
  Filled 2021-12-24: qty 2

## 2021-12-24 MED ORDER — DIPHENHYDRAMINE HCL 50 MG/ML IJ SOLN
50.0000 mg | Freq: Once | INTRAMUSCULAR | Status: AC
  Administered 2021-12-24: 50 mg via INTRAVENOUS
  Filled 2021-12-24: qty 1

## 2021-12-24 MED ORDER — ACETAMINOPHEN 325 MG PO TABS
650.0000 mg | ORAL_TABLET | Freq: Once | ORAL | Status: AC
  Administered 2021-12-24: 650 mg via ORAL
  Filled 2021-12-24: qty 2

## 2021-12-24 NOTE — Patient Instructions (Signed)
Thrombocytopenia Thrombocytopenia means that you have a low number of platelets in your blood. Platelets are tiny cells in the blood. When you bleed, they clump together at the cut or injury to stop the bleeding. This is called blood clotting. If you do not have enough platelets, your blood may have trouble clotting. This may cause you to bleed and bruise very easily. What are the causes? This condition is caused by a low number of platelets in your blood. There are three main reasons for this: Your body not making enough platelets. This may be caused by: Bone marrow diseases. Disorders that are passed from parent to child (inherited). Certain cancer medicines or treatments. Infection from germs (bacteria or viruses). Alcoholism. Platelets not being released in the blood. This can be caused by: Having a spleen that is larger than normal. A condition called Gaucher disease. Your body destroying platelets too quickly. This may be caused by: Certain autoimmune diseases. Some medicines that thin your blood. Certain blood clotting disorders. Certain bleeding disorders. Exposure to harmful (toxic) chemicals. Pregnancy. What are the signs or symptoms? Bruising easily. Bleeding from the nose or mouth. Heavy menstrual periods. Blood in the pee (urine), poop (stool), or vomit. A purple-red color to the skin (purpura). A rash that looks like pinpoint, purple-red spots (petechiae) on the lower legs. How is this treated? Treatment depends on the cause. Treatment may include: Treatment of another condition that is causing the low platelet count. Medicines to help protect your platelets from being destroyed. A replacement (transfusion) of platelets to stop or prevent bleeding. Surgery to take out the spleen. Follow these instructions at home: Medicines Take over-the-counter and prescription medicines only as told by your doctor. Do not take any medicines that have aspirin or NSAIDs, such as  ibuprofen. Activity Avoid doing things that could hurt or bruise you. Take action to prevent falls. Do not play contact sports. Ask your doctor what activities are safe for you. Take care not to burn yourself: When you use an iron. When you cook. Take care not to cut yourself: When you shave. When you use scissors, needles, knives, or other tools. General instructions  Check your skin and the inside of your mouth for bruises or blood as told by your doctor. Wear a medical alert bracelet that says that you have a bleeding disorder. Check to see if there is blood in your pee and poop. Do this as told by your doctor. Do not drink alcohol. If you do drink, limit the amount that you drink. Stay away from harmful (toxic) chemicals. Tell all of your doctors that you have this condition. Be sure to tell your dentist and eye doctor. Tell your dentist about your condition before you have your teeth cleaned. Keep all follow-up visits. Contact a doctor if: You have bruises and you do not know why. You have new symptoms. You have symptoms that get worse. You have a fever. Get help right away if: You have very bad bleeding anywhere on your body. You have blood in your vomit, pee, or poop. You have an injury to your head. You have a sudden, very bad headache. Summary Thrombocytopenia means that you have a low number of platelets in your blood. Platelets stick together to form a clot. Symptoms of this condition include getting bruises easily, bleeding from the mouth and nose, a purple-red color to the skin, and a rash. Take care not to cut or burn yourself. This information is not intended to replace advice given   to you by your health care provider. Make sure you discuss any questions you have with your health care provider. Document Revised: 01/25/2021 Document Reviewed: 01/25/2021 Elsevier Patient Education  2023 [employer]  

## 2021-12-25 LAB — PREPARE PLATELET PHERESIS: Unit division: 0

## 2021-12-25 LAB — TRANSFUSION REACTION
DAT C3: NEGATIVE
Post RXN DAT IgG: NEGATIVE

## 2021-12-25 LAB — BPAM PLATELET PHERESIS
Blood Product Expiration Date: 202305042359
ISSUE DATE / TIME: 202305011309
Unit Type and Rh: 5100

## 2021-12-26 LAB — CBC
HCT: 31.5 % — ABNORMAL LOW (ref 36.0–46.0)
Hemoglobin: 10.4 g/dL — ABNORMAL LOW (ref 12.0–15.0)
MCH: 31.3 pg (ref 26.0–34.0)
MCHC: 33 g/dL (ref 30.0–36.0)
MCV: 94.9 fL (ref 80.0–100.0)
Platelets: 9 10*3/uL — CL (ref 150–400)
RBC: 3.32 MIL/uL — ABNORMAL LOW (ref 3.87–5.11)
RDW: 17.2 % — ABNORMAL HIGH (ref 11.5–15.5)
WBC: 3.9 10*3/uL — ABNORMAL LOW (ref 4.0–10.5)
nRBC: 0 % (ref 0.0–0.2)

## 2021-12-31 ENCOUNTER — Other Ambulatory Visit: Payer: Self-pay | Admitting: Medical Oncology

## 2021-12-31 ENCOUNTER — Telehealth: Payer: Self-pay | Admitting: Medical Oncology

## 2021-12-31 ENCOUNTER — Inpatient Hospital Stay: Payer: BC Managed Care – PPO

## 2021-12-31 ENCOUNTER — Other Ambulatory Visit: Payer: Self-pay

## 2021-12-31 ENCOUNTER — Other Ambulatory Visit: Payer: Self-pay | Admitting: Physician Assistant

## 2021-12-31 DIAGNOSIS — D696 Thrombocytopenia, unspecified: Secondary | ICD-10-CM

## 2021-12-31 DIAGNOSIS — T7840XS Allergy, unspecified, sequela: Secondary | ICD-10-CM

## 2021-12-31 DIAGNOSIS — D492 Neoplasm of unspecified behavior of bone, soft tissue, and skin: Secondary | ICD-10-CM

## 2021-12-31 DIAGNOSIS — C3431 Malignant neoplasm of lower lobe, right bronchus or lung: Secondary | ICD-10-CM | POA: Diagnosis not present

## 2021-12-31 DIAGNOSIS — D702 Other drug-induced agranulocytosis: Secondary | ICD-10-CM

## 2021-12-31 DIAGNOSIS — D709 Neutropenia, unspecified: Secondary | ICD-10-CM | POA: Insufficient documentation

## 2021-12-31 LAB — CMP (CANCER CENTER ONLY)
ALT: 20 U/L (ref 0–44)
AST: 18 U/L (ref 15–41)
Albumin: 3.9 g/dL (ref 3.5–5.0)
Alkaline Phosphatase: 88 U/L (ref 38–126)
Anion gap: 6 (ref 5–15)
BUN: 14 mg/dL (ref 6–20)
CO2: 27 mmol/L (ref 22–32)
Calcium: 9.7 mg/dL (ref 8.9–10.3)
Chloride: 105 mmol/L (ref 98–111)
Creatinine: 0.74 mg/dL (ref 0.44–1.00)
GFR, Estimated: >60 mL/min (ref >60)
Glucose, Bld: 107 mg/dL — ABNORMAL HIGH (ref 70–99)
Potassium: 4 mmol/L (ref 3.5–5.1)
Sodium: 138 mmol/L (ref 135–145)
Total Bilirubin: 0.6 mg/dL (ref 0.3–1.2)
Total Protein: 7.5 g/dL (ref 6.5–8.1)

## 2021-12-31 LAB — CBC WITH DIFFERENTIAL (CANCER CENTER ONLY)
Abs Immature Granulocytes: 0 10*3/uL (ref 0.00–0.07)
Basophils Absolute: 0 10*3/uL (ref 0.0–0.1)
Basophils Relative: 0 %
Eosinophils Absolute: 0.1 10*3/uL (ref 0.0–0.5)
Eosinophils Relative: 6 %
HCT: 25.9 % — ABNORMAL LOW (ref 36.0–46.0)
Hemoglobin: 9.2 g/dL — ABNORMAL LOW (ref 12.0–15.0)
Immature Granulocytes: 0 %
Lymphocytes Relative: 41 %
Lymphs Abs: 0.6 10*3/uL — ABNORMAL LOW (ref 0.7–4.0)
MCH: 32.1 pg (ref 26.0–34.0)
MCHC: 35.5 g/dL (ref 30.0–36.0)
MCV: 90.2 fL (ref 80.0–100.0)
Monocytes Absolute: 0.1 10*3/uL (ref 0.1–1.0)
Monocytes Relative: 5 %
Neutro Abs: 0.7 10*3/uL — ABNORMAL LOW (ref 1.7–7.7)
Neutrophils Relative %: 48 %
Platelet Count: <5 10*3/uL — CL (ref 150–400)
RBC: 2.87 MIL/uL — ABNORMAL LOW (ref 3.87–5.11)
RDW: 16.8 % — ABNORMAL HIGH (ref 11.5–15.5)
WBC Count: 1.4 10*3/uL — ABNORMAL LOW (ref 4.0–10.5)
nRBC: 0 % (ref 0.0–0.2)

## 2021-12-31 LAB — SAMPLE TO BLOOD BANK

## 2021-12-31 LAB — TOTAL PROTEIN, URINE DIPSTICK: Protein, ur: NEGATIVE mg/dL

## 2021-12-31 MED ORDER — ACETAMINOPHEN 325 MG PO TABS
650.0000 mg | ORAL_TABLET | Freq: Once | ORAL | Status: AC
  Administered 2021-12-31: 650 mg via ORAL
  Filled 2021-12-31: qty 2

## 2021-12-31 MED ORDER — FAMOTIDINE IN NACL 20-0.9 MG/50ML-% IV SOLN
20.0000 mg | Freq: Once | INTRAVENOUS | Status: AC
  Administered 2021-12-31: 20 mg via INTRAVENOUS
  Filled 2021-12-31: qty 50

## 2021-12-31 MED ORDER — SODIUM CHLORIDE 0.9% IV SOLUTION
250.0000 mL | Freq: Once | INTRAVENOUS | Status: AC
  Administered 2021-12-31: 250 mL via INTRAVENOUS

## 2021-12-31 MED ORDER — DIPHENHYDRAMINE HCL 25 MG PO CAPS
50.0000 mg | ORAL_CAPSULE | Freq: Once | ORAL | Status: AC
  Administered 2021-12-31: 50 mg via ORAL
  Filled 2021-12-31: qty 2

## 2021-12-31 MED ORDER — SODIUM CHLORIDE 0.9% FLUSH: 3.0000 mL | INTRAVENOUS | Status: DC | PRN

## 2021-12-31 MED ORDER — METHYLPREDNISOLONE SODIUM SUCC 125 MG IJ SOLR
125.0000 mg | Freq: Once | INTRAMUSCULAR | Status: AC
  Administered 2021-12-31: 125 mg via INTRAVENOUS
  Filled 2021-12-31: qty 2

## 2021-12-31 NOTE — Patient Instructions (Signed)
Blood Transfusion, Adult A blood transfusion is a procedure in which you receive blood or a type of blood cell (blood component) through an IV. You may need a blood transfusion when your blood level is low. This may result from a bleeding disorder, illness, injury, or surgery. The blood may come from a donor. You may also be able to donate blood for yourself (autologous blood donation) before a planned surgery. The blood given in a transfusion is made up of different blood components. You may receive: Red blood cells. These carry oxygen to the cells in the body. Platelets. These help your blood to clot. Plasma. This is the liquid part of your blood. It carries proteins and other substances throughout the body. White blood cells. These help you fight infections. If you have hemophilia or another clotting disorder, you may also receive other types of blood products. Tell a health care provider about: Any blood disorders you have. Any previous reactions you have had during a blood transfusion. Any allergies you have. All medicines you are taking, including vitamins, herbs, eye drops, creams, and over-the-counter medicines. Any surgeries you have had. Any medical conditions you have, including any recent fever or cold symptoms. Whether you are pregnant or may be pregnant. What are the risks? Generally, this is a safe procedure. However, problems may occur. The most common problems include: A mild allergic reaction, such as red, swollen areas of skin (hives) and itching. Fever or chills. This may be the body's response to new blood cells received. This may occur during or up to 4 hours after the transfusion. More serious problems may include: Transfusion-associated circulatory overload (TACO), or too much fluid in the lungs. This may cause breathing problems. A serious allergic reaction, such as difficulty breathing or swelling around the face and lips. Transfusion-related acute lung injury  (TRALI), which causes breathing difficulty and low oxygen in the blood. This can occur within hours of the transfusion or several days later. Iron overload. This can happen after receiving many blood transfusions over a period of time. Infection or virus being transmitted. This is rare because donated blood is carefully tested before it is given. Hemolytic transfusion reaction. This is rare. It happens when your body's defense system (immune system)tries to attack the new blood cells. Symptoms may include fever, chills, nausea, low blood pressure, and low back or chest pain. Transfusion-associated graft-versus-host disease (TAGVHD). This is rare. It happens when donated cells attack your body's healthy tissues. What happens before the procedure? Medicines Ask your health care provider about: Changing or stopping your regular medicines. This is especially important if you are taking diabetes medicines or blood thinners. Taking medicines such as aspirin and ibuprofen. These medicines can thin your blood. Do not take these medicines unless your health care provider tells you to take them. Taking over-the-counter medicines, vitamins, herbs, and supplements. General instructions Follow instructions from your health care provider about eating and drinking restrictions. You will have a blood test to determine your blood type. This is necessary to know what kind of blood your body will accept and to match it to the donor blood. If you are going to have a planned surgery, you may be able to do an autologous blood donation. This may be done in case you need to have a transfusion. You will have your temperature, blood pressure, and pulse monitored before the transfusion. If you have had an allergic reaction to a transfusion in the past, you may be given medicine to help prevent   a reaction. This medicine may be given to you by mouth (orally) or through an IV. Set aside time for the blood transfusion. This  procedure generally takes 1-4 hours to complete. What happens during the procedure?  An IV will be inserted into one of your veins. The bag of donated blood will be attached to your IV. The blood will then enter through your vein. Your temperature, blood pressure, and pulse will be monitored regularly during the transfusion. This monitoring is done to detect early signs of a transfusion reaction. Tell your nurse right away if you have any of these symptoms during the transfusion: Shortness of breath or trouble breathing. Chest or back pain. Fever or chills. Hives or itching. If you have any signs or symptoms of a reaction, your transfusion will be stopped and you may be given medicine. When the transfusion is complete, your IV will be removed. Pressure may be applied to the IV site for a few minutes. A bandage (dressing)will be applied. The procedure may vary among health care providers and hospitals. What happens after the procedure? Your temperature, blood pressure, pulse, breathing rate, and blood oxygen level will be monitored until you leave the hospital or clinic. Your blood may be tested to see how you are responding to the transfusion. You may be warmed with fluids or blankets to maintain a normal body temperature. If you receive your blood transfusion in an outpatient setting, you will be told whom to contact to report any reactions. Where to find more information For more information on blood transfusions, visit the [employer]: redcross.org Summary A blood transfusion is a procedure in which you receive blood or a type of blood cell (blood component) through an IV. The blood you receive may come from a donor or be donated by yourself (autologous blood donation) before a planned surgery. The blood given in a transfusion is made up of different blood components. You may receive red blood cells, platelets, plasma, or white blood cells depending on the condition treated. Your  temperature, blood pressure, and pulse will be monitored before, during, and after the transfusion. After the transfusion, your blood may be tested to see how your body has responded. This information is not intended to replace advice given to you by your health care provider. Make sure you discuss any questions you have with your health care provider. Document Revised: 06/17/2019 Document Reviewed: 02/04/2019 Elsevier Patient Education  2023 Elsevier Inc.  

## 2021-12-31 NOTE — Telephone Encounter (Signed)
Nosebleed again in early hours today for 2 hours . Still bleeding on and off. Lab appt and infusion scheduled for today .Schedule message sent. ?

## 2022-01-01 ENCOUNTER — Telehealth: Payer: Self-pay | Admitting: Medical Oncology

## 2022-01-01 ENCOUNTER — Inpatient Hospital Stay: Payer: BC Managed Care – PPO

## 2022-01-01 ENCOUNTER — Other Ambulatory Visit: Payer: BC Managed Care – PPO

## 2022-01-01 ENCOUNTER — Other Ambulatory Visit: Payer: Self-pay | Admitting: Medical Oncology

## 2022-01-01 VITALS — BP 132/69 | HR 93 | Temp 98.3°F | Resp 18

## 2022-01-01 DIAGNOSIS — C3431 Malignant neoplasm of lower lobe, right bronchus or lung: Secondary | ICD-10-CM | POA: Diagnosis not present

## 2022-01-01 DIAGNOSIS — D702 Other drug-induced agranulocytosis: Secondary | ICD-10-CM

## 2022-01-01 DIAGNOSIS — D696 Thrombocytopenia, unspecified: Secondary | ICD-10-CM

## 2022-01-01 LAB — BPAM PLATELET PHERESIS
Blood Product Expiration Date: 202305112359
ISSUE DATE / TIME: 202305081452
Unit Type and Rh: 5100

## 2022-01-01 LAB — PREPARE PLATELET PHERESIS: Unit division: 0

## 2022-01-01 MED ORDER — FILGRASTIM-SNDZ 300 MCG/0.5ML IJ SOSY
300.0000 ug | PREFILLED_SYRINGE | Freq: Once | INTRAMUSCULAR | Status: AC
  Administered 2022-01-01: 300 ug via SUBCUTANEOUS
  Filled 2022-01-01: qty 0.5

## 2022-01-01 NOTE — Telephone Encounter (Signed)
Pt confirmed her appts for tomorrow.  ?

## 2022-01-01 NOTE — Patient Instructions (Signed)
Filgrastim, G-CSF injection ?What is this medication? ?FILGRASTIM, G-CSF (fil GRA stim) is a granulocyte colony-stimulating factor that stimulates the growth of neutrophils, a type of white blood cell (WBC) important in the body's fight against infection. It is used to reduce the incidence of fever and infection in patients with certain types of cancer who are receiving chemotherapy that affects the bone marrow, to stimulate blood cell production for removal of WBCs from the body prior to a bone marrow transplantation, to reduce the incidence of fever and infection in patients who have severe chronic neutropenia, and to improve survival outcomes following high-dose radiation exposure that is toxic to the bone marrow. ?This medicine may be used for other purposes; ask your health care provider or pharmacist if you have questions. ?COMMON BRAND NAME(S): Neupogen, Nivestym, Releuko, Zarxio ?What should I tell my care team before I take this medication? ?They need to know if you have any of these conditions: ?kidney disease ?latex allergy ?ongoing radiation therapy ?sickle cell disease ?an unusual or allergic reaction to filgrastim, pegfilgrastim, other medicines, foods, dyes, or preservatives ?pregnant or trying to get pregnant ?breast-feeding ?How should I use this medication? ?This medicine is for injection under the skin or infusion into a vein. As an infusion into a vein, it is usually given by a health care professional in a hospital or clinic setting. If you get this medicine at home, you will be taught how to prepare and give this medicine. Refer to the Instructions for Use that come with your medication packaging. Use exactly as directed. Take your medicine at regular intervals. Do not take your medicine more often than directed. ?It is important that you put your used needles and syringes in a special sharps container. Do not put them in a trash can. If you do not have a sharps container, call your pharmacist  or healthcare provider to get one. ?Talk to your pediatrician regarding the use of this medicine in children. While this drug may be prescribed for children as young as 7 months for selected conditions, precautions do apply. ?Overdosage: If you think you have taken too much of this medicine contact a poison control center or emergency room at once. ?NOTE: This medicine is only for you. Do not share this medicine with others. ?What if I miss a dose? ?It is important not to miss your dose. Call your doctor or health care professional if you miss a dose. ?What may interact with this medication? ?This medicine may interact with the following medications: ?medicines that may cause a release of neutrophils, such as lithium ?This list may not describe all possible interactions. Give your health care provider a list of all the medicines, herbs, non-prescription drugs, or dietary supplements you use. Also tell them if you smoke, drink alcohol, or use illegal drugs. Some items may interact with your medicine. ?What should I watch for while using this medication? ?Your condition will be monitored carefully while you are receiving this medicine. ?You may need blood work done while you are taking this medicine. ?Talk to your health care provider about your risk of cancer. You may be more at risk for certain types of cancer if you take this medicine. ?What side effects may I notice from receiving this medication? ?Side effects that you should report to your doctor or health care professional as soon as possible: ?allergic reactions like skin rash, itching or hives, swelling of the face, lips, or tongue ?back pain ?dizziness or feeling faint ?fever ?pain, redness, or  irritation at site where injected ?pinpoint red spots on the skin ?shortness of breath or breathing problems ?signs and symptoms of kidney injury like trouble passing urine, change in the amount of urine, or red or dark-brown urine ?stomach or side pain, or pain at  the shoulder ?swelling ?tiredness ?unusual bleeding or bruising ?Side effects that usually do not require medical attention (report to your doctor or health care professional if they continue or are bothersome): ?bone pain ?cough ?diarrhea ?hair loss ?headache ?muscle pain ?This list may not describe all possible side effects. Call your doctor for medical advice about side effects. You may report side effects to FDA at 1-800-FDA-1088. ?Where should I keep my medication? ?Keep out of the reach of children. ?Store in a refrigerator between 2 and 8 degrees C (36 and 46 degrees F). Do not freeze. Keep in carton to protect from light. Throw away this medicine if vials or syringes are left out of the refrigerator for more than 24 hours. Throw away any unused medicine after the expiration date. ?NOTE: This sheet is a summary. It may not cover all possible information. If you have questions about this medicine, talk to your doctor, pharmacist, or health care provider. ?? 2023 Elsevier/Gold Standard (2021-07-13 00:00:00) ? ?

## 2022-01-02 ENCOUNTER — Inpatient Hospital Stay: Payer: BC Managed Care – PPO

## 2022-01-02 ENCOUNTER — Other Ambulatory Visit: Payer: Self-pay | Admitting: Medical Oncology

## 2022-01-02 ENCOUNTER — Other Ambulatory Visit: Payer: Self-pay

## 2022-01-02 VITALS — BP 127/70 | HR 99 | Temp 97.9°F | Resp 17

## 2022-01-02 DIAGNOSIS — D492 Neoplasm of unspecified behavior of bone, soft tissue, and skin: Secondary | ICD-10-CM

## 2022-01-02 DIAGNOSIS — C3431 Malignant neoplasm of lower lobe, right bronchus or lung: Secondary | ICD-10-CM | POA: Diagnosis not present

## 2022-01-02 DIAGNOSIS — D696 Thrombocytopenia, unspecified: Secondary | ICD-10-CM

## 2022-01-02 DIAGNOSIS — T8089XS Other complications following infusion, transfusion and therapeutic injection, sequela: Secondary | ICD-10-CM

## 2022-01-02 DIAGNOSIS — D702 Other drug-induced agranulocytosis: Secondary | ICD-10-CM

## 2022-01-02 LAB — CBC WITH DIFFERENTIAL (CANCER CENTER ONLY)
Abs Immature Granulocytes: 0.1 10*3/uL — ABNORMAL HIGH (ref 0.00–0.07)
Band Neutrophils: 8 %
Basophils Absolute: 0 10*3/uL (ref 0.0–0.1)
Basophils Relative: 0 %
Eosinophils Absolute: 0 10*3/uL (ref 0.0–0.5)
Eosinophils Relative: 2 %
HCT: 24.1 % — ABNORMAL LOW (ref 36.0–46.0)
Hemoglobin: 8.2 g/dL — ABNORMAL LOW (ref 12.0–15.0)
Lymphocytes Relative: 16 %
Lymphs Abs: 0.3 10*3/uL — ABNORMAL LOW (ref 0.7–4.0)
MCH: 31.4 pg (ref 26.0–34.0)
MCHC: 34 g/dL (ref 30.0–36.0)
MCV: 92.3 fL (ref 80.0–100.0)
Metamyelocytes Relative: 2 %
Monocytes Absolute: 0.1 10*3/uL (ref 0.1–1.0)
Monocytes Relative: 6 %
Myelocytes: 1 %
Neutro Abs: 1.4 10*3/uL — ABNORMAL LOW (ref 1.7–7.7)
Neutrophils Relative %: 65 %
Platelet Count: 17 10*3/uL — ABNORMAL LOW (ref 150–400)
RBC: 2.61 MIL/uL — ABNORMAL LOW (ref 3.87–5.11)
RDW: 16.8 % — ABNORMAL HIGH (ref 11.5–15.5)
Smear Review: NORMAL
WBC Count: 1.9 10*3/uL — ABNORMAL LOW (ref 4.0–10.5)
nRBC: 0 % (ref 0.0–0.2)

## 2022-01-02 LAB — CMP (CANCER CENTER ONLY)
ALT: 17 U/L (ref 0–44)
AST: 16 U/L (ref 15–41)
Albumin: 3.7 g/dL (ref 3.5–5.0)
Alkaline Phosphatase: 77 U/L (ref 38–126)
Anion gap: 6 (ref 5–15)
BUN: 21 mg/dL — ABNORMAL HIGH (ref 6–20)
CO2: 28 mmol/L (ref 22–32)
Calcium: 8.8 mg/dL — ABNORMAL LOW (ref 8.9–10.3)
Chloride: 106 mmol/L (ref 98–111)
Creatinine: 0.73 mg/dL (ref 0.44–1.00)
GFR, Estimated: >60 mL/min (ref >60)
Glucose, Bld: 92 mg/dL (ref 70–99)
Potassium: 3.6 mmol/L (ref 3.5–5.1)
Sodium: 140 mmol/L (ref 135–145)
Total Bilirubin: 0.6 mg/dL (ref 0.3–1.2)
Total Protein: 7.1 g/dL (ref 6.5–8.1)

## 2022-01-02 LAB — SAMPLE TO BLOOD BANK

## 2022-01-02 MED ORDER — METHYLPREDNISOLONE SODIUM SUCC 125 MG IJ SOLR
125.0000 mg | Freq: Once | INTRAMUSCULAR | Status: AC
  Administered 2022-01-02: 125 mg via INTRAVENOUS
  Filled 2022-01-02: qty 2

## 2022-01-02 MED ORDER — ACETAMINOPHEN 325 MG PO TABS
650.0000 mg | ORAL_TABLET | Freq: Once | ORAL | Status: AC
  Administered 2022-01-02: 650 mg via ORAL
  Filled 2022-01-02: qty 2

## 2022-01-02 MED ORDER — FILGRASTIM-SNDZ 300 MCG/0.5ML IJ SOSY
300.0000 ug | PREFILLED_SYRINGE | Freq: Once | INTRAMUSCULAR | Status: AC
  Administered 2022-01-02: 300 ug via SUBCUTANEOUS
  Filled 2022-01-02: qty 0.5

## 2022-01-02 MED ORDER — DIPHENHYDRAMINE HCL 25 MG PO CAPS
25.0000 mg | ORAL_CAPSULE | Freq: Four times a day (QID) | ORAL | Status: AC | PRN
  Administered 2022-01-02: 25 mg via ORAL
  Filled 2022-01-02: qty 1

## 2022-01-02 MED ORDER — DIPHENHYDRAMINE HCL 25 MG PO CAPS
25.0000 mg | ORAL_CAPSULE | Freq: Once | ORAL | Status: AC
  Administered 2022-01-02: 25 mg via ORAL
  Filled 2022-01-02: qty 1

## 2022-01-02 MED ORDER — SODIUM CHLORIDE 0.9% IV SOLUTION
250.0000 mL | Freq: Once | INTRAVENOUS | Status: AC
  Administered 2022-01-02: 250 mL via INTRAVENOUS

## 2022-01-02 MED ORDER — FAMOTIDINE IN NACL 20-0.9 MG/50ML-% IV SOLN
20.0000 mg | Freq: Once | INTRAVENOUS | Status: AC
  Administered 2022-01-02: 20 mg via INTRAVENOUS
  Filled 2022-01-02: qty 50

## 2022-01-02 NOTE — Progress Notes (Signed)
Per Cassie give Zarixo  300 mcg today for a total of 2 doses.Marland Kitchen  ?

## 2022-01-02 NOTE — Progress Notes (Signed)
Per Dr. Julien Nordmann, patient ok to receive 50mg  PO benadryl today. ? ?Also, per Cassie, patient to receive 2 doses total of Zarxio, one yesterday and one today.  Patient will not need dose tomorrow.  Patient informed of plan of care and stated understanding ?

## 2022-01-02 NOTE — Patient Instructions (Signed)
Platelet Transfusion ?A platelet transfusion is a procedure in which a person receives donated platelets through an IV. Platelets are parts of blood that stick together and form a clot to help the body stop bleeding after an injury. If you have too few platelets, your blood may have trouble clotting. This may cause you to bleed and bruise very easily. ?You may need a platelet transfusion if you have a condition that causes a low number of platelets (thrombocytopenia). A platelet transfusion may be used to stop or prevent excessive bleeding. ?Tell a health care provider about: ?Any reactions you have had during previous transfusions. ?Any allergies you have. ?All medicines you are taking, including vitamins, herbs, eye drops, creams, and over-the-counter medicines. ?Any bleeding problems you have. ?Any surgeries you have had. ?Any medical conditions you have. ?Whether you are pregnant or may be pregnant. ?What are the risks? ?Generally, this is a safe procedure. However, problems may occur, including: ?Fever. ?Infection. ?Allergic reaction to the donated (donor) platelets. ?Your body's disease-fighting system (immune system) attacking the donor platelets (hemolytic reaction). This is rare. ?A rare reaction that causes lung damage (transfusion-related acute lung injury). ?What happens before the procedure? ?Medicines ?Ask your health care provider about: ?Changing or stopping your regular medicines. This is especially important if you are taking diabetes medicines or blood thinners. ?Taking medicines such as aspirin and ibuprofen. These medicines can thin your blood. Do not take these medicines unless your health care provider tells you to take them. ?Taking over-the-counter medicines, vitamins, herbs, and supplements. ?General instructions ?You will have a blood test to determine your blood type. Your blood type determines what kind of platelets you will be given. ?Follow instructions from your health care provider  about eating or drinking restrictions. ?If you have had an allergic reaction to a transfusion in the past, you may be given medicine to help prevent a reaction. ?Your temperature, blood pressure, pulse, and breathing will be monitored. ?What happens during the procedure? ? ?An IV will be inserted into one of your veins. ?For your safety, two health care providers will verify your identity along with the donor platelets about to be infused. ?A bag of donor platelets will be connected to your IV. The platelets will flow into your bloodstream. This usually takes 30-60 minutes. ?Your temperature, blood pressure, pulse, and breathing will be monitored during the transfusion. This helps detect early signs of any reaction. ?You will also be monitored for other symptoms that may indicate a reaction, including chills, hives, or itching. ?If you have signs of a reaction at any time, your transfusion will be stopped, and you may be given medicine to help manage the reaction. ?When your transfusion is complete, your IV will be removed. ?Pressure may be applied to the IV site for a few minutes to stop any bleeding. ?The IV site will be covered with a bandage (dressing). ?The procedure may vary among health care providers and hospitals. ?What can I expect after the procedure? ?Your blood pressure, temperature, pulse, and breathing will be monitored until you leave the hospital or clinic. ?You may have some bruising and soreness at your IV site. ?Follow these instructions at home: ?Medicines ?Take over-the-counter and prescription medicines only as told by your health care provider. ?Talk with your health care provider before you take any medicines that contain aspirin or NSAIDs, such as ibuprofen. These medicines increase your risk for dangerous bleeding. ?IV site care ?Check your IV site every day for signs of infection. Check for: ?  Redness, swelling, or pain. ?Fluid or blood. If fluid or blood drains from your IV site, use your  hands to press down firmly on a bandage covering the area for a minute or two. Doing this should stop the bleeding. ?Warmth. ?Pus or a bad smell. ?General instructions ?Change or remove your dressing as told by your health care provider. ?Return to your normal activities as told by your health care provider. Ask your health care provider what activities are safe for you. ?Do not take baths, swim, or use a hot tub until your health care provider approves. Ask your health care provider if you may take showers. ?Keep all follow-up visits. This is important. ?Contact a health care provider if: ?You have a headache that does not go away with medicine. ?You have hives, rash, or itchy skin. ?You have nausea or vomiting. ?You feel unusually tired or weak. ?You have signs of infection at your IV site. ?Get help right away if: ?You have a fever or chills. ?You urinate less often than usual. ?Your urine is darker colored than normal. ?You have any of the following: ?Trouble breathing. ?Pain in your back, abdomen, or chest. ?Cool, clammy skin. ?A fast heartbeat. ?Summary ?Platelets are tiny pieces of blood cells that clump together to form a blood clot when you have an injury. If you have too few platelets, your blood may have trouble clotting. ?A platelet transfusion is a procedure in which you receive donated platelets through an IV. ?A platelet transfusion may be used to stop or prevent excessive bleeding. ?After the procedure, check your IV site every day for signs of infection. ?This information is not intended to replace advice given to you by your health care provider. Make sure you discuss any questions you have with your health care provider. ?Document Revised: 02/15/2021 Document Reviewed: 02/15/2021 ?Elsevier Patient Education ? [place]. ? ?

## 2022-01-03 LAB — BPAM PLATELET PHERESIS
Blood Product Expiration Date: 202305112359
ISSUE DATE / TIME: 202305101126
Unit Type and Rh: 5100

## 2022-01-03 LAB — PREPARE PLATELET PHERESIS: Unit division: 0

## 2022-01-04 ENCOUNTER — Other Ambulatory Visit: Payer: Self-pay | Admitting: Medical Oncology

## 2022-01-04 ENCOUNTER — Other Ambulatory Visit: Payer: Self-pay

## 2022-01-04 ENCOUNTER — Inpatient Hospital Stay: Payer: BC Managed Care – PPO

## 2022-01-04 DIAGNOSIS — T7840XS Allergy, unspecified, sequela: Secondary | ICD-10-CM

## 2022-01-04 DIAGNOSIS — D696 Thrombocytopenia, unspecified: Secondary | ICD-10-CM

## 2022-01-04 DIAGNOSIS — T8089XS Other complications following infusion, transfusion and therapeutic injection, sequela: Secondary | ICD-10-CM

## 2022-01-04 DIAGNOSIS — C3431 Malignant neoplasm of lower lobe, right bronchus or lung: Secondary | ICD-10-CM | POA: Diagnosis not present

## 2022-01-04 LAB — CBC WITH DIFFERENTIAL (CANCER CENTER ONLY)
Abs Immature Granulocytes: 0.01 10*3/uL (ref 0.00–0.07)
Basophils Absolute: 0 10*3/uL (ref 0.0–0.1)
Basophils Relative: 0 %
Eosinophils Absolute: 0.1 10*3/uL (ref 0.0–0.5)
Eosinophils Relative: 4 %
HCT: 23.4 % — ABNORMAL LOW (ref 36.0–46.0)
Hemoglobin: 8 g/dL — ABNORMAL LOW (ref 12.0–15.0)
Immature Granulocytes: 1 %
Lymphocytes Relative: 25 %
Lymphs Abs: 0.5 10*3/uL — ABNORMAL LOW (ref 0.7–4.0)
MCH: 32.1 pg (ref 26.0–34.0)
MCHC: 34.2 g/dL (ref 30.0–36.0)
MCV: 94 fL (ref 80.0–100.0)
Monocytes Absolute: 0.1 10*3/uL (ref 0.1–1.0)
Monocytes Relative: 7 %
Neutro Abs: 1.2 10*3/uL — ABNORMAL LOW (ref 1.7–7.7)
Neutrophils Relative %: 63 %
Platelet Count: 25 10*3/uL — ABNORMAL LOW (ref 150–400)
RBC: 2.49 MIL/uL — ABNORMAL LOW (ref 3.87–5.11)
RDW: 16.9 % — ABNORMAL HIGH (ref 11.5–15.5)
Smear Review: NORMAL
WBC Count: 1.8 10*3/uL — ABNORMAL LOW (ref 4.0–10.5)
nRBC: 0 % (ref 0.0–0.2)

## 2022-01-04 LAB — SAMPLE TO BLOOD BANK

## 2022-01-04 MED ORDER — DIPHENHYDRAMINE HCL 25 MG PO CAPS
50.0000 mg | ORAL_CAPSULE | Freq: Once | ORAL | Status: AC
  Administered 2022-01-04: 50 mg via ORAL
  Filled 2022-01-04: qty 2

## 2022-01-04 MED ORDER — METHYLPREDNISOLONE SODIUM SUCC 125 MG IJ SOLR
125.0000 mg | INTRAMUSCULAR | Status: DC
  Administered 2022-01-04: 125 mg via INTRAVENOUS
  Filled 2022-01-04: qty 2

## 2022-01-04 MED ORDER — ACETAMINOPHEN 325 MG PO TABS
650.0000 mg | ORAL_TABLET | Freq: Once | ORAL | Status: AC
  Administered 2022-01-04: 650 mg via ORAL
  Filled 2022-01-04: qty 2

## 2022-01-04 MED ORDER — FAMOTIDINE IN NACL 20-0.9 MG/50ML-% IV SOLN
20.0000 mg | Freq: Once | INTRAVENOUS | Status: AC
  Administered 2022-01-04: 20 mg via INTRAVENOUS
  Filled 2022-01-04: qty 50

## 2022-01-04 NOTE — Patient Instructions (Signed)
Thrombocytopenia Thrombocytopenia means that you have a low number of platelets in your blood. Platelets are tiny cells in the blood. When you bleed, they clump together at the cut or injury to stop the bleeding. This is called blood clotting. If you do not have enough platelets, your blood may have trouble clotting. This may cause you to bleed and bruise very easily. What are the causes? This condition is caused by a low number of platelets in your blood. There are three main reasons for this: Your body not making enough platelets. This may be caused by: Bone marrow diseases. Disorders that are passed from parent to child (inherited). Certain cancer medicines or treatments. Infection from germs (bacteria or viruses). Alcoholism. Platelets not being released in the blood. This can be caused by: Having a spleen that is larger than normal. A condition called Gaucher disease. Your body destroying platelets too quickly. This may be caused by: Certain autoimmune diseases. Some medicines that thin your blood. Certain blood clotting disorders. Certain bleeding disorders. Exposure to harmful (toxic) chemicals. Pregnancy. What are the signs or symptoms? Bruising easily. Bleeding from the nose or mouth. Heavy menstrual periods. Blood in the pee (urine), poop (stool), or vomit. A purple-red color to the skin (purpura). A rash that looks like pinpoint, purple-red spots (petechiae) on the lower legs. How is this treated? Treatment depends on the cause. Treatment may include: Treatment of another condition that is causing the low platelet count. Medicines to help protect your platelets from being destroyed. A replacement (transfusion) of platelets to stop or prevent bleeding. Surgery to take out the spleen. Follow these instructions at home: Medicines Take over-the-counter and prescription medicines only as told by your doctor. Do not take any medicines that have aspirin or NSAIDs, such as  ibuprofen. Activity Avoid doing things that could hurt or bruise you. Take action to prevent falls. Do not play contact sports. Ask your doctor what activities are safe for you. Take care not to burn yourself: When you use an iron. When you cook. Take care not to cut yourself: When you shave. When you use scissors, needles, knives, or other tools. General instructions  Check your skin and the inside of your mouth for bruises or blood as told by your doctor. Wear a medical alert bracelet that says that you have a bleeding disorder. Check to see if there is blood in your pee and poop. Do this as told by your doctor. Do not drink alcohol. If you do drink, limit the amount that you drink. Stay away from harmful (toxic) chemicals. Tell all of your doctors that you have this condition. Be sure to tell your dentist and eye doctor. Tell your dentist about your condition before you have your teeth cleaned. Keep all follow-up visits. Contact a doctor if: You have bruises and you do not know why. You have new symptoms. You have symptoms that get worse. You have a fever. Get help right away if: You have very bad bleeding anywhere on your body. You have blood in your vomit, pee, or poop. You have an injury to your head. You have a sudden, very bad headache. Summary Thrombocytopenia means that you have a low number of platelets in your blood. Platelets stick together to form a clot. Symptoms of this condition include getting bruises easily, bleeding from the mouth and nose, a purple-red color to the skin, and a rash. Take care not to cut or burn yourself. This information is not intended to replace advice given   to you by your health care provider. Make sure you discuss any questions you have with your health care provider. Document Revised: 01/25/2021 Document Reviewed: 01/25/2021 Elsevier Patient Education  2023 [employer]  

## 2022-01-05 LAB — PREPARE PLATELET PHERESIS: Unit division: 0

## 2022-01-05 LAB — BPAM PLATELET PHERESIS
Blood Product Expiration Date: 202305152359
ISSUE DATE / TIME: 202305121101
Unit Type and Rh: 5100

## 2022-01-08 ENCOUNTER — Other Ambulatory Visit: Payer: BC Managed Care – PPO

## 2022-01-08 ENCOUNTER — Ambulatory Visit: Payer: BC Managed Care – PPO

## 2022-01-08 ENCOUNTER — Ambulatory Visit: Payer: BC Managed Care – PPO | Admitting: Internal Medicine

## 2022-01-09 ENCOUNTER — Other Ambulatory Visit: Payer: Self-pay

## 2022-01-09 ENCOUNTER — Telehealth: Payer: Self-pay

## 2022-01-09 DIAGNOSIS — C3491 Malignant neoplasm of unspecified part of right bronchus or lung: Secondary | ICD-10-CM

## 2022-01-09 NOTE — Telephone Encounter (Signed)
I have called the pt back to acknowledge her call. I have also scheduled the pt for labs tomorrow morning at 10am. Pt was advised to please stay in the waiting area after her lab appt. Pt agrees with this plan. ?

## 2022-01-09 NOTE — Telephone Encounter (Signed)
Duplicate-opened in error. 

## 2022-01-09 NOTE — Telephone Encounter (Signed)
Pt has left a message wanting to know when she should follow up here at [facility]. She states she has not been having nosebleeds but is feeling weak and sometimes faint. ? ?Per Dr. Arman Bogus 12/27/21 note: "Ms. Hollenbeck. She has had a complicated medical course recently prompting a hold in both temozolomide and bevacizumab. I recommended that both agents be held until platelet count is >50. At that time, bevacizumab may be resumed at every 2 week dosing schedule. I explained that I would not resume temozolomide until the platelet count is >100 and, even at that point, would strongly consider a 50% dose reduction, if the decision is made to rechallenge (with original generic version received or brand name). If tolerated, one could consider re-escalation to full dose. If there is a prolonged recovery in platelet count, one could also consider continuing with bevacizumab alone. Ms Pendergraph will discuss further with Dr. Julien Nordmann. She will reach out in ~3 months to schedule repeat imaging. Ms. Herzig is in agreement with the plan and will contact the clinic with any questions or concerns in the interim". ?

## 2022-01-10 ENCOUNTER — Other Ambulatory Visit: Payer: Self-pay | Admitting: Physician Assistant

## 2022-01-10 ENCOUNTER — Other Ambulatory Visit: Payer: Self-pay

## 2022-01-10 ENCOUNTER — Inpatient Hospital Stay: Payer: BC Managed Care – PPO

## 2022-01-10 ENCOUNTER — Inpatient Hospital Stay (HOSPITAL_BASED_OUTPATIENT_CLINIC_OR_DEPARTMENT_OTHER): Payer: BC Managed Care – PPO | Admitting: Physician Assistant

## 2022-01-10 VITALS — BP 136/79 | HR 104 | Temp 98.4°F | Resp 18

## 2022-01-10 VITALS — BP 148/77 | HR 116 | Temp 98.0°F | Resp 22

## 2022-01-10 DIAGNOSIS — D696 Thrombocytopenia, unspecified: Secondary | ICD-10-CM

## 2022-01-10 DIAGNOSIS — C3491 Malignant neoplasm of unspecified part of right bronchus or lung: Secondary | ICD-10-CM

## 2022-01-10 DIAGNOSIS — D649 Anemia, unspecified: Secondary | ICD-10-CM

## 2022-01-10 DIAGNOSIS — C3431 Malignant neoplasm of lower lobe, right bronchus or lung: Secondary | ICD-10-CM | POA: Diagnosis not present

## 2022-01-10 DIAGNOSIS — T8090XS Unspecified complication following infusion and therapeutic injection, sequela: Secondary | ICD-10-CM

## 2022-01-10 DIAGNOSIS — D702 Other drug-induced agranulocytosis: Secondary | ICD-10-CM

## 2022-01-10 LAB — CMP (CANCER CENTER ONLY)
ALT: 17 U/L (ref 0–44)
AST: 20 U/L (ref 15–41)
Albumin: 3.9 g/dL (ref 3.5–5.0)
Alkaline Phosphatase: 86 U/L (ref 38–126)
Anion gap: 4 — ABNORMAL LOW (ref 5–15)
BUN: 11 mg/dL (ref 6–20)
CO2: 30 mmol/L (ref 22–32)
Calcium: 9.3 mg/dL (ref 8.9–10.3)
Chloride: 105 mmol/L (ref 98–111)
Creatinine: 0.72 mg/dL (ref 0.44–1.00)
GFR, Estimated: >60 mL/min (ref >60)
Glucose, Bld: 103 mg/dL — ABNORMAL HIGH (ref 70–99)
Potassium: 4.1 mmol/L (ref 3.5–5.1)
Sodium: 139 mmol/L (ref 135–145)
Total Bilirubin: 0.5 mg/dL (ref 0.3–1.2)
Total Protein: 7.6 g/dL (ref 6.5–8.1)

## 2022-01-10 LAB — CBC WITH DIFFERENTIAL (CANCER CENTER ONLY)
Abs Immature Granulocytes: 0.01 10*3/uL (ref 0.00–0.07)
Basophils Absolute: 0 10*3/uL (ref 0.0–0.1)
Basophils Relative: 1 %
Eosinophils Absolute: 0 10*3/uL (ref 0.0–0.5)
Eosinophils Relative: 1 %
HCT: 23.9 % — ABNORMAL LOW (ref 36.0–46.0)
Hemoglobin: 8.2 g/dL — ABNORMAL LOW (ref 12.0–15.0)
Immature Granulocytes: 1 %
Lymphocytes Relative: 53 %
Lymphs Abs: 0.9 10*3/uL (ref 0.7–4.0)
MCH: 32.5 pg (ref 26.0–34.0)
MCHC: 34.3 g/dL (ref 30.0–36.0)
MCV: 94.8 fL (ref 80.0–100.0)
Monocytes Absolute: 0.2 10*3/uL (ref 0.1–1.0)
Monocytes Relative: 10 %
Neutro Abs: 0.6 10*3/uL — ABNORMAL LOW (ref 1.7–7.7)
Neutrophils Relative %: 34 %
Platelet Count: 16 10*3/uL — ABNORMAL LOW (ref 150–400)
RBC: 2.52 MIL/uL — ABNORMAL LOW (ref 3.87–5.11)
RDW: 17.4 % — ABNORMAL HIGH (ref 11.5–15.5)
WBC Count: 1.7 10*3/uL — ABNORMAL LOW (ref 4.0–10.5)
nRBC: 0 % (ref 0.0–0.2)

## 2022-01-10 LAB — SAMPLE TO BLOOD BANK

## 2022-01-10 LAB — PREPARE RBC (CROSSMATCH)

## 2022-01-10 MED ORDER — DIPHENHYDRAMINE HCL 25 MG PO CAPS
50.0000 mg | ORAL_CAPSULE | Freq: Once | ORAL | Status: AC
  Administered 2022-01-10: 50 mg via ORAL
  Filled 2022-01-10: qty 2

## 2022-01-10 MED ORDER — FILGRASTIM-SNDZ 300 MCG/0.5ML IJ SOSY
300.0000 ug | PREFILLED_SYRINGE | Freq: Once | INTRAMUSCULAR | Status: AC
  Administered 2022-01-10: 300 ug via SUBCUTANEOUS
  Filled 2022-01-10: qty 0.5

## 2022-01-10 MED ORDER — METHYLPREDNISOLONE SODIUM SUCC 125 MG IJ SOLR
125.0000 mg | Freq: Once | INTRAMUSCULAR | Status: AC
  Administered 2022-01-10: 125 mg via INTRAVENOUS
  Filled 2022-01-10: qty 2

## 2022-01-10 MED ORDER — ACETAMINOPHEN 325 MG PO TABS
650.0000 mg | ORAL_TABLET | Freq: Once | ORAL | Status: AC
  Administered 2022-01-10: 650 mg via ORAL
  Filled 2022-01-10: qty 2

## 2022-01-10 MED ORDER — SODIUM CHLORIDE 0.9 % IV SOLN: Freq: Once | INTRAVENOUS | Status: AC

## 2022-01-10 MED ORDER — SODIUM CHLORIDE 0.9% IV SOLUTION: 250.0000 mL | Freq: Once | INTRAVENOUS | Status: DC

## 2022-01-10 MED ORDER — FAMOTIDINE IN NACL 20-0.9 MG/50ML-% IV SOLN
20.0000 mg | Freq: Once | INTRAVENOUS | Status: AC
  Administered 2022-01-10: 20 mg via INTRAVENOUS
  Filled 2022-01-10: qty 50

## 2022-01-10 MED ORDER — SODIUM CHLORIDE 0.9% IV SOLUTION
250.0000 mL | Freq: Once | INTRAVENOUS | Status: AC
  Administered 2022-01-10: 250 mL via INTRAVENOUS

## 2022-01-10 NOTE — Patient Instructions (Signed)
Blood Transfusion, Adult, Care After This sheet gives you information about how to care for yourself after your procedure. Your doctor may also give you more specific instructions. If you have problems or questions, contact your doctor. What can I expect after the procedure? After the procedure, it is common to have: Bruising and soreness at the IV site. A headache. Follow these instructions at home: Insertion site care     Follow instructions from your doctor about how to take care of your insertion site. This is where an IV tube was put into your vein. Make sure you: Wash your hands with soap and water before and after you change your bandage (dressing). If you cannot use soap and water, use hand sanitizer. Change your bandage as told by your doctor. Check your insertion site every day for signs of infection. Check for: Redness, swelling, or pain. Bleeding from the site. Warmth. Pus or a bad smell. General instructions Take over-the-counter and prescription medicines only as told by your doctor. Rest as told by your doctor. Go back to your normal activities as told by your doctor. Keep all follow-up visits as told by your doctor. This is important. Contact a doctor if: You have itching or red, swollen areas of skin (hives). You feel worried or nervous (anxious). You feel weak after doing your normal activities. You have redness, swelling, warmth, or pain around the insertion site. You have blood coming from the insertion site, and the blood does not stop with pressure. You have pus or a bad smell coming from the insertion site. Get help right away if: You have signs of a serious reaction. This may be coming from an allergy or the body's defense system (immune system). Signs include: Trouble breathing or shortness of breath. Swelling of the face or feeling warm (flushed). Fever or chills. Head, chest, or back pain. Dark pee (urine) or blood in the pee. Widespread rash. Fast  heartbeat. Feeling dizzy or light-headed. You may receive your blood transfusion in an outpatient setting. If so, you will be told whom to contact to report any reactions. These symptoms may be an emergency. Do not wait to see if the symptoms will go away. Get medical help right away. Call your local emergency services (911 in the [place] Do not drive yourself to the hospital. Summary Bruising and soreness at the IV site are common. Check your insertion site every day for signs of infection. Rest as told by your doctor. Go back to your normal activities as told by your doctor. Get help right away if you have signs of a serious reaction. This information is not intended to replace advice given to you by your health care provider. Make sure you discuss any questions you have with your health care provider. Document Revised: 12/07/2020 Document Reviewed: 02/04/2019 Elsevier Patient Education  2023 Elsevier Inc.  

## 2022-01-10 NOTE — Progress Notes (Signed)
Symptom Management Consult note [facility]    Patient Care Team: [name], FNP as PCP - General (Family Medicine)    Name of the patient: Brenda Patel  [id number]  06/17/1961   Date of visit: 01/10/2022    Chief complaint/ Reason for visit- near syncope  Oncology History   No history exists.    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 Patient had a lab appointment this morning to check her hemoglobin and platelets as she had been feeling weak x 1 day. She was walking her dog and thinks she might have gone too far because about 0.25 miles into the walk she felt as if she was going to pass out. She bent over and put her head between her knees. A neighbor saw her and was able to drive her home. Once home she laid on the floor with her legs up against the wall. She did not loose consciousness. She states the same feeling returned when she was in the shower later that evening. She had labs drawn this morning and then was sitting in the treatment waiting area when she suddenly felt as if she was going to pass out. She admits to being fearful of needles and was thinking about them while sitting there. RNs evaluated patient immediately and reported she was tachycardic to 116 with otherwise normal vitals. Patient admits to feeling weak after walking into the building today. She denies any fever, chills, chest pain, shortness of breath, hemoptysis, abdominal pain, nausea, vomiting, blood in stool.     ROS  All other systems are reviewed and are negative for acute change except as noted in the HPI.    Allergies  Allergen Reactions   Cyanoacrylate Dermatitis    Dermabond Topical Skin Adhesive   Other Dermatitis    Bard  Power Port   Codeine Nausea Only and Other (See Comments)    Severe constipation, also   Lentil Other (See Comments)    Lima Beans - Allergy tested.     Past Medical History:  Diagnosis Date   Asthma    Cancer ([place])    Depression    Dyspnea    Pleural effusion on right    Pleural mass      Past Surgical History:  Procedure Laterality Date   CHEST TUBE INSERTION Right 07/16/2018   Procedure: INSERTION PLEURAL DRAINAGE CATHETER, right;  Surgeon: Grace Isaac, MD;  Location: [place];  Service: Thoracic;  Laterality: Right;   COLONOSCOPY  2017   GUM SURGERY  1984   GRAFTS   IR IMAGING GUIDED PORT INSERTION  10/02/2021   IR RADIOLOGIST EVAL & MGMT  10/08/2021   IR RADIOLOGIST EVAL & MGMT  10/11/2021   IR REMOVAL TUN ACCESS W/ PORT W/O FL MOD SED  10/15/2021   IR THORACENTESIS ASP PLEURAL SPACE W/IMG GUIDE  07/03/2018    Social History   Socioeconomic History   Marital status: Divorced    Spouse name: Not on file   Number of children: Not on file   Years of education: Not on file   Highest education level: Not on file  Occupational History   Not on file  Tobacco Use   Smoking status: Never   Smokeless tobacco:  Never  Vaping Use   Vaping Use: Never used  Substance and Sexual Activity   Alcohol use: Yes    Comment: 3-4 glasses of wine per month/liquor 1x monthly   Drug use: Never   Sexual activity: Not on file  Other Topics Concern   Not on file  Social History Narrative   Not on file   Social Determinants of Health   Financial Resource Strain: Not on file  Food Insecurity: Not on file  Transportation Needs: Not on file  Physical Activity: Not on file  Stress: Not on file  Social Connections: Not on file  Intimate Partner Violence: Not on file    No family history on file.   Current Outpatient Medications:    Calcium Citrate 333 MG TABS, Take 333 mg by mouth daily., Disp: , Rfl:    [employer] 12 HOUR NASAL DECONGESTANT 120 MG 12 hr tablet, Take 120 mg by mouth  daily., Disp: , Rfl: 10   fluticasone (FLONASE) 50 MCG/ACT nasal spray, Place 1 spray into both nostrils daily as needed for allergies., Disp: , Rfl: 5   meloxicam (MOBIC) 15 MG tablet, Take 15 mg by mouth daily as needed for pain. , Disp: , Rfl:    montelukast (SINGULAIR) 10 MG tablet, Take 10 mg by mouth daily after supper., Disp: , Rfl: 10   ondansetron (ZOFRAN) 8 MG tablet, TAKE 1 TABLET BY MOUTH EVERY 8 HOURS AS NEEDED FOR NAUSEA OR VOMITING. (Patient taking differently: Take 8 mg by mouth every 8 (eight) hours as needed for nausea or vomiting.), Disp: 18 tablet, Rfl: 1   temozolomide (TEMODAR) 140 MG capsule, Take 2 capsules (280 mg total) by mouth daily. Take on days 1-7 and days 15-21 of each 28 day cycle. May take on an empty stomach or at bedtime to decrease nausea & vomiting., Disp: 28 capsule, Rfl: 3 No current facility-administered medications for this visit.  Facility-Administered Medications Ordered in Other Visits:    0.9 %  sodium chloride infusion (Manually program via Guardrails IV Fluids), 250 mL, Intravenous, Once, Curt Bears, MD   0.9 %  sodium chloride infusion (Manually program via Guardrails IV Fluids), 250 mL, Intravenous, Once, Heilingoetter, Cassandra L, PA-C   0.9 %  sodium chloride infusion, , Intravenous, Once, Walisiewicz, [place] Lebeau E, PA-C  PHYSICAL EXAM: ECOG FS:1 - Symptomatic but completely ambulatory    Vitals:   01/10/22 1232  BP: (!) 148/77  Pulse: (!) 116  Resp: (!) 22  Temp: 98 F (36.7 C)  SpO2: 100%   Physical Exam Vitals and nursing note reviewed.  Constitutional:      Appearance: She is well-developed. She is not ill-appearing or toxic-appearing.  HENT:     Head: Normocephalic and atraumatic.     Right Ear: External ear normal.     Left Ear: External ear normal.     Nose: Nose normal.     Mouth/Throat:     Mouth: Mucous membranes are moist.  Eyes:     General: No scleral icterus.       Right eye: No discharge.        Left eye:  No discharge.     Conjunctiva/sclera: Conjunctivae normal.  Neck:     Vascular: No JVD.  Cardiovascular:     Rate and Rhythm: Regular rhythm. Tachycardia present.     Pulses: Normal pulses.     Heart sounds: Normal heart sounds. No murmur heard. Pulmonary:     Effort: Pulmonary effort is normal. No respiratory  distress.     Breath sounds: Normal breath sounds. No stridor. No wheezing, rhonchi or rales.  Chest:     Chest wall: No tenderness.  Abdominal:     General: There is no distension.  Musculoskeletal:        General: Normal range of motion.     Cervical back: Normal range of motion.  Skin:    General: Skin is warm and dry.     Capillary Refill: Capillary refill takes less than 2 seconds.  Neurological:     Mental Status: She is oriented to person, place, and time.     GCS: GCS eye subscore is 4. GCS verbal subscore is 5. GCS motor subscore is 6.     Comments: Fluent speech, no facial droop.  Psychiatric:        Behavior: Behavior normal.       LABORATORY DATA: I have reviewed the data as listed    Latest Ref Rng & Units 01/10/2022   10:13 AM 01/04/2022    8:35 AM 01/02/2022    9:09 AM  CBC  WBC 4.0 - 10.5 K/uL 1.7   1.8   1.9    Hemoglobin 12.0 - 15.0 g/dL 8.2   8.0   8.2    Hematocrit 36.0 - 46.0 % 23.9   23.4   24.1    Platelets 150 - 400 K/uL 16   25   17           Latest Ref Rng & Units 01/10/2022   10:13 AM 01/02/2022    9:09 AM 12/31/2021   12:12 PM  CMP  Glucose 70 - 99 mg/dL 103   92   107    BUN 6 - 20 mg/dL 11   21   14     Creatinine 0.44 - 1.00 mg/dL 0.72   0.73   0.74    Sodium 135 - 145 mmol/L 139   140   138    Potassium 3.5 - 5.1 mmol/L 4.1   3.6   4.0    Chloride 98 - 111 mmol/L 105   106   105    CO2 22 - 32 mmol/L 30   28   27     Calcium 8.9 - 10.3 mg/dL 9.3   8.8   9.7    Total Protein 6.5 - 8.1 g/dL 7.6   7.1   7.5    Total Bilirubin 0.3 - 1.2 mg/dL 0.5   0.6   0.6    Alkaline Phos 38 - 126 U/L 86   77   88    AST 15 - 41 U/L 20   16    18     ALT 0 - 44 U/L 17   17   20          RADIOGRAPHIC STUDIES: I have personally reviewed the radiological images as listed and agreed with the findings in the report. No images are attached to the encounter. No results found.   ASSESSMENT & PLAN: Patient is a 61 y.o. 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 Julien Nordmann. I viewed recent oncology notes as well as appointment with [facility] oncologist and labs.  #) Near syncope- Patient non toxic appearing. Appeared anxious, was symptomatic after lab draw. Given 500 ml IVF and felt better on reassessment. Tachycardia resolved.   #)Neutropenia- WBC 1.7 and ANC 0.6. Patient given Zarxio injection today and scheduled for the second one tomorrow.  #)Thrombocytopenia-  Patient received premedications and platelet infusion after CBC shows platelet count of 16.   #) Anemia- Patient given 1 unit of blood as she is symptomatic from anemia. Hemoglobin 8.2 today.      Discussed patient with primary oncology team and they are recommending to move forward with twice weekly lab appointments and biweekly appointment with provider to closely monitor labs in the coming weeks. Patient is agreeable with plan and appointments were scheduled.    Visit Diagnosis: 1. Thrombocytopenia ([place])   2. Anemia, unspecified type      No orders of the defined types were placed in this encounter.   All questions were answered. The patient knows to call the clinic with any problems, questions or concerns. No barriers to learning was detected.  I have spent a total of 30 minutes minutes of face-to-face and non-face-to-face time, preparing to see the patient, obtaining and/or reviewing separately obtained history, performing a medically appropriate examination, counseling and educating the patient, ordering tests,  documenting clinical information in the electronic health record, and care  coordination.     Thank you for allowing me to participate in the care of this patient.    Barrie Folk, PA-C Department of Hematology/Oncology [facility] at [facility] Phone: [phone]  [phone]    01/10/2022 2:53 PM

## 2022-01-11 ENCOUNTER — Inpatient Hospital Stay: Payer: BC Managed Care – PPO

## 2022-01-11 ENCOUNTER — Other Ambulatory Visit: Payer: Self-pay

## 2022-01-11 VITALS — BP 140/82 | HR 91 | Temp 98.3°F | Resp 18

## 2022-01-11 DIAGNOSIS — C3431 Malignant neoplasm of lower lobe, right bronchus or lung: Secondary | ICD-10-CM | POA: Diagnosis not present

## 2022-01-11 DIAGNOSIS — D702 Other drug-induced agranulocytosis: Secondary | ICD-10-CM

## 2022-01-11 LAB — BPAM RBC
Blood Product Expiration Date: 202306192359
ISSUE DATE / TIME: 202305181452
Unit Type and Rh: 5100

## 2022-01-11 LAB — TYPE AND SCREEN
ABO/RH(D): O POS
Antibody Screen: NEGATIVE
Unit division: 0

## 2022-01-11 LAB — BPAM PLATELET PHERESIS
Blood Product Expiration Date: 202305202359
ISSUE DATE / TIME: 202305181334
Unit Type and Rh: 5100

## 2022-01-11 LAB — PREPARE PLATELET PHERESIS: Unit division: 0

## 2022-01-11 MED ORDER — FILGRASTIM-SNDZ 300 MCG/0.5ML IJ SOSY
300.0000 ug | PREFILLED_SYRINGE | Freq: Once | INTRAMUSCULAR | Status: AC
  Administered 2022-01-11: 300 ug via SUBCUTANEOUS
  Filled 2022-01-11: qty 0.5

## 2022-01-14 ENCOUNTER — Other Ambulatory Visit: Payer: Self-pay

## 2022-01-14 ENCOUNTER — Inpatient Hospital Stay: Payer: BC Managed Care – PPO

## 2022-01-14 ENCOUNTER — Encounter: Payer: Self-pay | Admitting: Internal Medicine

## 2022-01-14 ENCOUNTER — Inpatient Hospital Stay (HOSPITAL_BASED_OUTPATIENT_CLINIC_OR_DEPARTMENT_OTHER): Payer: BC Managed Care – PPO | Admitting: Internal Medicine

## 2022-01-14 VITALS — BP 135/86 | HR 107 | Temp 97.4°F | Resp 16 | Wt 157.2 lb

## 2022-01-14 DIAGNOSIS — C3431 Malignant neoplasm of lower lobe, right bronchus or lung: Secondary | ICD-10-CM | POA: Diagnosis not present

## 2022-01-14 DIAGNOSIS — Z5111 Encounter for antineoplastic chemotherapy: Secondary | ICD-10-CM

## 2022-01-14 DIAGNOSIS — C3491 Malignant neoplasm of unspecified part of right bronchus or lung: Secondary | ICD-10-CM | POA: Diagnosis not present

## 2022-01-14 DIAGNOSIS — D492 Neoplasm of unspecified behavior of bone, soft tissue, and skin: Secondary | ICD-10-CM

## 2022-01-14 LAB — CBC WITH DIFFERENTIAL (CANCER CENTER ONLY)
Abs Immature Granulocytes: 0.05 10*3/uL (ref 0.00–0.07)
Basophils Absolute: 0 10*3/uL (ref 0.0–0.1)
Basophils Relative: 0 %
Eosinophils Absolute: 0 10*3/uL (ref 0.0–0.5)
Eosinophils Relative: 0 %
HCT: 28.3 % — ABNORMAL LOW (ref 36.0–46.0)
Hemoglobin: 9.7 g/dL — ABNORMAL LOW (ref 12.0–15.0)
Immature Granulocytes: 2 %
Lymphocytes Relative: 41 %
Lymphs Abs: 1.1 10*3/uL (ref 0.7–4.0)
MCH: 32 pg (ref 26.0–34.0)
MCHC: 34.3 g/dL (ref 30.0–36.0)
MCV: 93.4 fL (ref 80.0–100.0)
Monocytes Absolute: 0.5 10*3/uL (ref 0.1–1.0)
Monocytes Relative: 19 %
Neutro Abs: 1 10*3/uL — ABNORMAL LOW (ref 1.7–7.7)
Neutrophils Relative %: 38 %
Platelet Count: 46 10*3/uL — ABNORMAL LOW (ref 150–400)
RBC: 3.03 MIL/uL — ABNORMAL LOW (ref 3.87–5.11)
RDW: 18.1 % — ABNORMAL HIGH (ref 11.5–15.5)
WBC Count: 2.7 10*3/uL — ABNORMAL LOW (ref 4.0–10.5)
nRBC: 0 % (ref 0.0–0.2)

## 2022-01-14 LAB — CMP (CANCER CENTER ONLY)
ALT: 16 U/L (ref 0–44)
AST: 17 U/L (ref 15–41)
Albumin: 4 g/dL (ref 3.5–5.0)
Alkaline Phosphatase: 90 U/L (ref 38–126)
Anion gap: 7 (ref 5–15)
BUN: 12 mg/dL (ref 6–20)
CO2: 27 mmol/L (ref 22–32)
Calcium: 9 mg/dL (ref 8.9–10.3)
Chloride: 106 mmol/L (ref 98–111)
Creatinine: 0.75 mg/dL (ref 0.44–1.00)
GFR, Estimated: >60 mL/min (ref >60)
Glucose, Bld: 65 mg/dL — ABNORMAL LOW (ref 70–99)
Potassium: 3.8 mmol/L (ref 3.5–5.1)
Sodium: 140 mmol/L (ref 135–145)
Total Bilirubin: 0.4 mg/dL (ref 0.3–1.2)
Total Protein: 7.7 g/dL (ref 6.5–8.1)

## 2022-01-14 LAB — SAMPLE TO BLOOD BANK

## 2022-01-14 NOTE — Progress Notes (Signed)
[facility] [phone]   [phone]  OFFICE PROGRESS NOTE  [name], FNP [place] [address]  DIAGNOSIS: Malignant neoplasm with sarcomatoid features presented as large right lower lobe lung mass with recurrent right pleural effusion diagnosed in December 2019.  PRIOR THERAPY:  1) Status post right Pleurx catheter placement for drainage of recurrent right pleural effusion. 2) repeat biopsy on August 11, 2018 of the lung mass at [facility] and it showed atypical spindle cell proliferation. 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 4) restaging CT scan of the chest on September 14, 2019 showed new 1.5 cm soft tissue lesion along the right subpulmonic diaphragmatic pleural surface worrisome for tumor recurrence.  She was followed by observation by Dr. Angelina Ok at [place] [facility] 5) SBRT to the right lower lobe/diaphragmatic lesion completed June 22, 2020. 6) restaging scan on November 30, 2020 showed new right posterior pleural-based nodule followed by observation.  But repeat CT scan of the chest on 02/07/2021 showed enlarging right pleural mass/nodules. 7) the patient is started treatment with pazopanib on 02/17/2021 400 mg titrated to 800 mg p.o. daily but her treatment was held between July 8 to March 05, 2021 secondary to fatigue and arthralgia.  She resumed her treatment at a dose of 400 mg p.o. daily for 3 days titrated to 600 mg p.o. daily but this was again held in mid August 2022.  She resumed her treatment on 04/23/2021 at 400 mg p.o. daily but this was discontinued in October 2022 secondary to disease progression was a scan on 05/30/2021 revealing progressive multifocal pleural-based disease.  CURRENT  THERAPY: Temodar 150 Mg/M2 daily for days 1-7 and 15-21 every 4 weeks in addition to a Avastin 5 Mg/KG on days 8 and 22 every 4 weeks.  Started October 15, 2021.  Status post 2 cycles of treatment.  INTERVAL HISTORY: Brenda Patel 61 y.o. female returns to the clinic today for follow-up visit.  The patient continues to complain of increasing fatigue and weakness.  She had required platelets and PRBCs transfusion at regular basis recently secondary to chemotherapy induced pancytopenia from treatment with Temodar.  She also has been complaining of nosebleed but this is improved recently.  She denied having any current chest pain but has shortness of breath with exertion with no cough or hemoptysis.  She has no nausea, vomiting, diarrhea or constipation.  She is here today for evaluation and repeat blood work.  Her last CT scan of the chest, abdomen and pelvis showed stable disease.   MEDICAL HISTORY: Past Medical History:  Diagnosis Date   Asthma    Cancer ([place])    Depression    Dyspnea    Pleural effusion on right    Pleural mass     ALLERGIES:  is allergic to cyanoacrylate, other, codeine, and lentil.  MEDICATIONS:  Current Outpatient Medications  Medication Sig Dispense Refill   Calcium Citrate 333 MG TABS Take 333 mg by mouth daily.     [employer] 12 HOUR NASAL DECONGESTANT 120 MG 12 hr tablet Take 120 mg by mouth daily.  10   fluticasone (FLONASE) 50 MCG/ACT nasal spray Place 1 spray into both nostrils daily as needed for allergies.  5   meloxicam (MOBIC) 15 MG tablet  Take 15 mg by mouth daily as needed for pain.      montelukast (SINGULAIR) 10 MG tablet Take 10 mg by mouth daily after supper.  10   ondansetron (ZOFRAN) 8 MG tablet TAKE 1 TABLET BY MOUTH EVERY 8 HOURS AS NEEDED FOR NAUSEA OR VOMITING. (Patient taking differently: Take 8 mg by mouth every 8 (eight) hours as needed for nausea or vomiting.) 18 tablet 1   temozolomide (TEMODAR) 140 MG capsule Take 2 capsules (280 mg total) by  mouth daily. Take on days 1-7 and days 15-21 of each 28 day cycle. May take on an empty stomach or at bedtime to decrease nausea & vomiting. 28 capsule 3   No current facility-administered medications for this visit.    SURGICAL HISTORY:  Past Surgical History:  Procedure Laterality Date   CHEST TUBE INSERTION Right 07/16/2018   Procedure: INSERTION PLEURAL DRAINAGE CATHETER, right;  Surgeon: Grace Isaac, MD;  Location: [place];  Service: Thoracic;  Laterality: Right;   COLONOSCOPY  2017   GUM SURGERY  1984   GRAFTS   IR IMAGING GUIDED PORT INSERTION  10/02/2021   IR RADIOLOGIST EVAL & MGMT  10/08/2021   IR RADIOLOGIST EVAL & MGMT  10/11/2021   IR REMOVAL TUN ACCESS W/ PORT W/O FL MOD SED  10/15/2021   IR THORACENTESIS ASP PLEURAL SPACE W/IMG GUIDE  07/03/2018    REVIEW OF SYSTEMS:  A comprehensive review of systems was negative except for: Constitutional: positive for fatigue Musculoskeletal: positive for muscle weakness   PHYSICAL EXAMINATION: General appearance: alert, cooperative, fatigued, and no distress Head: Normocephalic, without obvious abnormality, atraumatic Neck: no adenopathy, no JVD, supple, symmetrical, trachea midline, and thyroid not enlarged, symmetric, no tenderness/mass/nodules Lymph nodes: Cervical, supraclavicular, and axillary nodes normal. Resp: clear to auscultation bilaterally Back: symmetric, no curvature. ROM normal. No CVA tenderness. Cardio: regular rate and rhythm, S1, S2 normal, no murmur, click, rub or gallop GI: soft, non-tender; bowel sounds normal; no masses,  no organomegaly Extremities: extremities normal, atraumatic, no cyanosis or edema  ECOG PERFORMANCE STATUS: 1 - Symptomatic but completely ambulatory  Blood pressure 135/86, pulse (!) 107, temperature (!) 97.4 F (36.3 C), temperature source Tympanic, resp. rate 16, weight 157 lb 4 oz (71.3 kg), SpO2 100 %.  LABORATORY DATA: Lab Results  Component Value Date   WBC 2.7 (L) 01/14/2022    HGB 9.7 (L) 01/14/2022   HCT 28.3 (L) 01/14/2022   MCV 93.4 01/14/2022   PLT 46 (L) 01/14/2022      Chemistry      Component Value Date/Time   NA 140 01/14/2022 0802   K 3.8 01/14/2022 0802   CL 106 01/14/2022 0802   CO2 27 01/14/2022 0802   BUN 12 01/14/2022 0802   CREATININE 0.75 01/14/2022 0802      Component Value Date/Time   CALCIUM 9.0 01/14/2022 0802   ALKPHOS 90 01/14/2022 0802   AST 17 01/14/2022 0802   ALT 16 01/14/2022 0802   BILITOT 0.4 01/14/2022 0802       RADIOGRAPHIC STUDIES: No results found.   ASSESSMENT AND PLAN: This is a very pleasant 61 years old white female presented with large right lower lobe lung mass in addition to right pleural effusion.  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 The patient was referred to Dr. Angelina Ok at [facility] for second opinion and she underwent several studies and intervention as listed below 1) repeat biopsy on  August 11, 2018 of the lung mass at [facility] and it showed atypical spindle cell proliferation. 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 3) restaging CT scan of the chest on September 14, 2019 showed new 1.5 cm soft tissue lesion along the right subpulmonic diaphragmatic pleural surface worrisome for tumor recurrence.  She was followed by observation by Dr. Angelina Ok at [facility] [facility] 4) SBRT to the right lower lobe/diaphragmatic lesion completed June 22, 2020. 5) restaging scan on November 30, 2020 showed new right posterior pleural-based nodule followed by observation.  But repeat CT scan of the chest on 02/07/2021 showed enlarging right pleural mass/nodules. 6) the patient is started treatment with pazopanib on 02/17/2021 400  mg titrated to 800 mg p.o. daily but her treatment was held between July 8 to March 05, 2021 secondary to fatigue and arthralgia.  She resumed her treatment at a dose of 400 mg p.o. daily for 3 days titrated to 600 mg p.o. daily but this was again held in mid August 2022.  She resumed her treatment on 04/23/2021 at 400 mg p.o. daily but this was discontinued in October 2022 secondary to disease progression was a scan on 05/30/2021 revealing progressive multifocal pleural-based disease. Dr. Angelina Ok recommended for the patient treatment with Temodar and Avastin.  She started Temodar 150 Mg/M2 on days 1 and 7 as well as day 15-21 in addition to Avastin 5 Mg/KG on days 1 and 22 every 4 weeks.  She is status post 2 cycles.   Her treatment is currently on hold secondary to significant chemotherapy-induced pancytopenia with a platelet count that were down to less than 5000 with neutropenia as well as anemia.  The patient required frequent PRBCs and platelet transfusion recently. Repeat CBC today showed total white blood count of 2.7 with hemoglobin of 9.7 and hematocrit 28.3% and platelet count of 46,000. I recommended for the patient to continue on observation for now with repeat blood work in few more days. Once her platelets count are over 50,000 we will may resume her treatment with Avastin but will not be able to resume her treatment with Temodar until platelets count are close to 100,000. The patient will come back for follow-up visit in 2 weeks for evaluation before making a decision about resuming her treatment. She was advised to call immediately if she has any other concerning symptoms in the interval. The patient voices understanding of current disease status and treatment options and is in agreement with the current care plan.  All questions were answered. The patient knows to call the clinic with any problems, questions or concerns. We can certainly see the patient much sooner if necessary.  The total  time spent in the appointment was 20 minutes.  Disclaimer: This note was dictated with voice recognition software. Similar sounding words can inadvertently be transcribed and may not be corrected upon review.

## 2022-01-17 ENCOUNTER — Inpatient Hospital Stay: Payer: BC Managed Care – PPO

## 2022-01-17 ENCOUNTER — Other Ambulatory Visit: Payer: Self-pay

## 2022-01-17 DIAGNOSIS — C3431 Malignant neoplasm of lower lobe, right bronchus or lung: Secondary | ICD-10-CM | POA: Diagnosis not present

## 2022-01-17 DIAGNOSIS — C3491 Malignant neoplasm of unspecified part of right bronchus or lung: Secondary | ICD-10-CM

## 2022-01-17 DIAGNOSIS — D492 Neoplasm of unspecified behavior of bone, soft tissue, and skin: Secondary | ICD-10-CM

## 2022-01-17 LAB — CMP (CANCER CENTER ONLY)
ALT: 17 U/L (ref 0–44)
AST: 19 U/L (ref 15–41)
Albumin: 4.2 g/dL (ref 3.5–5.0)
Alkaline Phosphatase: 93 U/L (ref 38–126)
Anion gap: 4 — ABNORMAL LOW (ref 5–15)
BUN: 18 mg/dL (ref 6–20)
CO2: 30 mmol/L (ref 22–32)
Calcium: 9.4 mg/dL (ref 8.9–10.3)
Chloride: 104 mmol/L (ref 98–111)
Creatinine: 0.79 mg/dL (ref 0.44–1.00)
GFR, Estimated: >60 mL/min (ref >60)
Glucose, Bld: 105 mg/dL — ABNORMAL HIGH (ref 70–99)
Potassium: 4.4 mmol/L (ref 3.5–5.1)
Sodium: 138 mmol/L (ref 135–145)
Total Bilirubin: 0.5 mg/dL (ref 0.3–1.2)
Total Protein: 7.7 g/dL (ref 6.5–8.1)

## 2022-01-17 LAB — CBC WITH DIFFERENTIAL (CANCER CENTER ONLY)
Abs Immature Granulocytes: 0.04 10*3/uL (ref 0.00–0.07)
Basophils Absolute: 0 10*3/uL (ref 0.0–0.1)
Basophils Relative: 0 %
Eosinophils Absolute: 0 10*3/uL (ref 0.0–0.5)
Eosinophils Relative: 0 %
HCT: 28.6 % — ABNORMAL LOW (ref 36.0–46.0)
Hemoglobin: 9.7 g/dL — ABNORMAL LOW (ref 12.0–15.0)
Immature Granulocytes: 1 %
Lymphocytes Relative: 47 %
Lymphs Abs: 1.4 10*3/uL (ref 0.7–4.0)
MCH: 32.1 pg (ref 26.0–34.0)
MCHC: 33.9 g/dL (ref 30.0–36.0)
MCV: 94.7 fL (ref 80.0–100.0)
Monocytes Absolute: 0.5 10*3/uL (ref 0.1–1.0)
Monocytes Relative: 16 %
Neutro Abs: 1.1 10*3/uL — ABNORMAL LOW (ref 1.7–7.7)
Neutrophils Relative %: 36 %
Platelet Count: 43 10*3/uL — ABNORMAL LOW (ref 150–400)
RBC: 3.02 MIL/uL — ABNORMAL LOW (ref 3.87–5.11)
RDW: 18.5 % — ABNORMAL HIGH (ref 11.5–15.5)
WBC Count: 3 10*3/uL — ABNORMAL LOW (ref 4.0–10.5)
nRBC: 0 % (ref 0.0–0.2)

## 2022-01-17 LAB — TOTAL PROTEIN, URINE DIPSTICK: Protein, ur: NEGATIVE mg/dL

## 2022-01-22 ENCOUNTER — Other Ambulatory Visit: Payer: BC Managed Care – PPO

## 2022-01-22 ENCOUNTER — Ambulatory Visit: Payer: BC Managed Care – PPO

## 2022-01-22 ENCOUNTER — Ambulatory Visit: Payer: BC Managed Care – PPO | Admitting: Physician Assistant

## 2022-01-23 ENCOUNTER — Inpatient Hospital Stay: Payer: BC Managed Care – PPO

## 2022-01-23 ENCOUNTER — Other Ambulatory Visit: Payer: Self-pay

## 2022-01-23 DIAGNOSIS — C3431 Malignant neoplasm of lower lobe, right bronchus or lung: Secondary | ICD-10-CM | POA: Diagnosis not present

## 2022-01-23 DIAGNOSIS — C3491 Malignant neoplasm of unspecified part of right bronchus or lung: Secondary | ICD-10-CM

## 2022-01-23 LAB — CMP (CANCER CENTER ONLY)
ALT: 18 U/L (ref 0–44)
AST: 21 U/L (ref 15–41)
Albumin: 4.2 g/dL (ref 3.5–5.0)
Alkaline Phosphatase: 83 U/L (ref 38–126)
Anion gap: 4 — ABNORMAL LOW (ref 5–15)
BUN: 18 mg/dL (ref 6–20)
CO2: 30 mmol/L (ref 22–32)
Calcium: 9.5 mg/dL (ref 8.9–10.3)
Chloride: 105 mmol/L (ref 98–111)
Creatinine: 0.73 mg/dL (ref 0.44–1.00)
GFR, Estimated: >60 mL/min (ref >60)
Glucose, Bld: 96 mg/dL (ref 70–99)
Potassium: 3.9 mmol/L (ref 3.5–5.1)
Sodium: 139 mmol/L (ref 135–145)
Total Bilirubin: 0.4 mg/dL (ref 0.3–1.2)
Total Protein: 7.7 g/dL (ref 6.5–8.1)

## 2022-01-23 LAB — CBC WITH DIFFERENTIAL (CANCER CENTER ONLY)
Abs Immature Granulocytes: 0.02 10*3/uL (ref 0.00–0.07)
Basophils Absolute: 0 10*3/uL (ref 0.0–0.1)
Basophils Relative: 0 %
Eosinophils Absolute: 0 10*3/uL (ref 0.0–0.5)
Eosinophils Relative: 0 %
HCT: 28.4 % — ABNORMAL LOW (ref 36.0–46.0)
Hemoglobin: 9.6 g/dL — ABNORMAL LOW (ref 12.0–15.0)
Immature Granulocytes: 0 %
Lymphocytes Relative: 32 %
Lymphs Abs: 1.4 10*3/uL (ref 0.7–4.0)
MCH: 33.1 pg (ref 26.0–34.0)
MCHC: 33.8 g/dL (ref 30.0–36.0)
MCV: 97.9 fL (ref 80.0–100.0)
Monocytes Absolute: 0.4 10*3/uL (ref 0.1–1.0)
Monocytes Relative: 9 %
Neutro Abs: 2.6 10*3/uL (ref 1.7–7.7)
Neutrophils Relative %: 59 %
Platelet Count: 78 10*3/uL — ABNORMAL LOW (ref 150–400)
RBC: 2.9 MIL/uL — ABNORMAL LOW (ref 3.87–5.11)
RDW: 20.5 % — ABNORMAL HIGH (ref 11.5–15.5)
WBC Count: 4.5 10*3/uL (ref 4.0–10.5)
nRBC: 0 % (ref 0.0–0.2)

## 2022-01-23 LAB — SAMPLE TO BLOOD BANK

## 2022-01-23 NOTE — Progress Notes (Signed)
No need for PRBC/platelet transfusion today. Patient made aware.

## 2022-01-28 ENCOUNTER — Inpatient Hospital Stay: Payer: BC Managed Care – PPO | Attending: Internal Medicine

## 2022-01-28 ENCOUNTER — Inpatient Hospital Stay: Payer: BC Managed Care – PPO

## 2022-01-28 DIAGNOSIS — M2550 Pain in unspecified joint: Secondary | ICD-10-CM | POA: Diagnosis not present

## 2022-01-28 DIAGNOSIS — D649 Anemia, unspecified: Secondary | ICD-10-CM | POA: Diagnosis not present

## 2022-01-28 DIAGNOSIS — Z885 Allergy status to narcotic agent status: Secondary | ICD-10-CM | POA: Diagnosis not present

## 2022-01-28 DIAGNOSIS — Z79630 Long term (current) use of alkylating agent: Secondary | ICD-10-CM | POA: Insufficient documentation

## 2022-01-28 DIAGNOSIS — C3491 Malignant neoplasm of unspecified part of right bronchus or lung: Secondary | ICD-10-CM

## 2022-01-28 DIAGNOSIS — J90 Pleural effusion, not elsewhere classified: Secondary | ICD-10-CM | POA: Insufficient documentation

## 2022-01-28 DIAGNOSIS — R509 Fever, unspecified: Secondary | ICD-10-CM | POA: Insufficient documentation

## 2022-01-28 DIAGNOSIS — C3431 Malignant neoplasm of lower lobe, right bronchus or lung: Secondary | ICD-10-CM | POA: Insufficient documentation

## 2022-01-28 DIAGNOSIS — R233 Spontaneous ecchymoses: Secondary | ICD-10-CM | POA: Insufficient documentation

## 2022-01-28 DIAGNOSIS — Z5112 Encounter for antineoplastic immunotherapy: Secondary | ICD-10-CM | POA: Insufficient documentation

## 2022-01-28 DIAGNOSIS — Z79899 Other long term (current) drug therapy: Secondary | ICD-10-CM | POA: Insufficient documentation

## 2022-01-28 DIAGNOSIS — Z8616 Personal history of COVID-19: Secondary | ICD-10-CM | POA: Diagnosis not present

## 2022-01-28 DIAGNOSIS — J45909 Unspecified asthma, uncomplicated: Secondary | ICD-10-CM | POA: Insufficient documentation

## 2022-01-28 LAB — CMP (CANCER CENTER ONLY)
ALT: 13 U/L (ref 0–44)
AST: 18 U/L (ref 15–41)
Albumin: 3.9 g/dL (ref 3.5–5.0)
Alkaline Phosphatase: 80 U/L (ref 38–126)
Anion gap: 8 (ref 5–15)
BUN: 11 mg/dL (ref 6–20)
CO2: 27 mmol/L (ref 22–32)
Calcium: 9.5 mg/dL (ref 8.9–10.3)
Chloride: 104 mmol/L (ref 98–111)
Creatinine: 0.72 mg/dL (ref 0.44–1.00)
GFR, Estimated: >60 mL/min (ref >60)
Glucose, Bld: 111 mg/dL — ABNORMAL HIGH (ref 70–99)
Potassium: 3.5 mmol/L (ref 3.5–5.1)
Sodium: 139 mmol/L (ref 135–145)
Total Bilirubin: 0.3 mg/dL (ref 0.3–1.2)
Total Protein: 7.1 g/dL (ref 6.5–8.1)

## 2022-01-28 LAB — CBC WITH DIFFERENTIAL (CANCER CENTER ONLY)
Abs Immature Granulocytes: 0.01 10*3/uL (ref 0.00–0.07)
Basophils Absolute: 0 10*3/uL (ref 0.0–0.1)
Basophils Relative: 0 %
Eosinophils Absolute: 0 10*3/uL (ref 0.0–0.5)
Eosinophils Relative: 1 %
HCT: 27.5 % — ABNORMAL LOW (ref 36.0–46.0)
Hemoglobin: 9.2 g/dL — ABNORMAL LOW (ref 12.0–15.0)
Immature Granulocytes: 0 %
Lymphocytes Relative: 19 %
Lymphs Abs: 0.9 10*3/uL (ref 0.7–4.0)
MCH: 33 pg (ref 26.0–34.0)
MCHC: 33.5 g/dL (ref 30.0–36.0)
MCV: 98.6 fL (ref 80.0–100.0)
Monocytes Absolute: 0.6 10*3/uL (ref 0.1–1.0)
Monocytes Relative: 13 %
Neutro Abs: 3.5 10*3/uL (ref 1.7–7.7)
Neutrophils Relative %: 67 %
Platelet Count: 106 10*3/uL — ABNORMAL LOW (ref 150–400)
RBC: 2.79 MIL/uL — ABNORMAL LOW (ref 3.87–5.11)
RDW: 21.3 % — ABNORMAL HIGH (ref 11.5–15.5)
WBC Count: 5.1 10*3/uL (ref 4.0–10.5)
nRBC: 0 % (ref 0.0–0.2)

## 2022-01-28 LAB — SAMPLE TO BLOOD BANK

## 2022-01-28 NOTE — Progress Notes (Unsigned)
CBC reviewed with C Heilinggoetter PA. Np transfusion needed today. Pt is to f/u on Thursday as scheduled w/ Dr. Julien Nordmann on Thursday. Pt notified of, and is agreeable with the plan of care.

## 2022-01-29 ENCOUNTER — Telehealth: Payer: Self-pay | Admitting: Medical Oncology

## 2022-01-29 NOTE — Telephone Encounter (Signed)
COVID test positive-She had a home Covid test last night and it tested positive.  Symptoms -Cough and fever of 100.1 last night now it is 99.5. Took tylenol. Dr Julien Nordmann I told her that she should  contact PCP for further instructions.

## 2022-01-29 NOTE — Telephone Encounter (Signed)
Video visit w PCP today at 4:30p.

## 2022-01-31 ENCOUNTER — Inpatient Hospital Stay: Payer: BC Managed Care – PPO

## 2022-01-31 ENCOUNTER — Inpatient Hospital Stay: Payer: BC Managed Care – PPO | Admitting: Internal Medicine

## 2022-02-04 ENCOUNTER — Inpatient Hospital Stay: Payer: BC Managed Care – PPO

## 2022-02-07 ENCOUNTER — Inpatient Hospital Stay: Payer: BC Managed Care – PPO

## 2022-02-08 NOTE — Progress Notes (Unsigned)
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  PRIOR THERAPY: 1) Status post right Pleurx catheter placement for drainage of recurrent right pleural effusion. 2) repeat biopsy on August 11, 2018 of the lung mass at [facility] and it showed atypical spindle cell proliferation. 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 4) restaging CT scan of the chest on September 14, 2019 showed new 1.5 cm soft tissue lesion along the right subpulmonic diaphragmatic pleural surface worrisome for tumor recurrence.  She was followed by observation by Dr. Angelina Ok at [place] [facility] 5) SBRT to the right lower lobe/diaphragmatic lesion completed June 22, 2020. 6) restaging scan on November 30, 2020 showed new right posterior pleural-based nodule followed by observation.  But repeat CT scan of the chest on 02/07/2021 showed enlarging right pleural mass/nodules. 7) the patient is started treatment with pazopanib on 02/17/2021 400 mg titrated to 800 mg p.o. daily but her treatment was held between July 8 to March 05, 2021 secondary to fatigue and arthralgia.  She resumed her treatment at a dose of 400 mg p.o. daily for 3 days titrated to 600 mg p.o. daily but this was again held in mid August 2022.  She resumed her treatment on 04/23/2021 at 400 mg p.o. daily but this was discontinued in October 2022 secondary to disease progression was a scan on 05/30/2021 revealing progressive multifocal pleural-based disease.  CURRENT THERAPY: Avastin and Temodar as recommended by Dr. Angelina Ok  at [place]. First dose of temodar expected on 10/30/21. Temodar days 1-7, and 15-21 p.o. every 4 weeks with IV avastin 5 mg/kg on days 8 and 22 every 4 weeks. She missed day 8 cycle #1 due to issues with her port-a-cath. Status post 2 cycles . Treatment has been on hold for several weeks due to pancytopenia. Resume treatment starting today 02/11/22.   INTERVAL HISTORY: Brenda Patel 61 y.o. female returns to the clinic today for a follow-up visit today. In mid April 2023 the patient is having new onset petechiae and epistaxis.  Last dose of Temodar was on 12/09/2021.   She had prolonged pancytopenia which required transfusion support with platelets and red blood cells.  She also previously had a significant transfusion reaction with platelets for which she requires premedications with Pepcid 20 mg, Solu-Medrol 125 mg, Benadryl 50, and Tylenol 650.  It took several weeks but her labs have finally started to recover.  She saw Dr. Angelina Ok at [facility] on 12/27/2020 to discussed the next steps in her plan of care.  Dr. Angelina Ok recommended that she continue to hold both Avastin and Temodar until her platelet count is above 50,000.  At that time Avastin can be resumed at every 2 weeks.  He did not recommend resuming Temodar unless her platelet count is above 100,000 and even at that point he would strongly consider a 50% dose reduction if the decision is made to rechallenge.  If there is prolonged recovery and platelet count 1 can consider continuing on bevacizumab alone.  Her next follow-up with Dr. Angelina Ok is not until July.   Also in the interval since last  being seen, the patient was diagnosed with COVID-19 on 01/29/2022. Her only symptom was a fever. She reports fatigue as well. She is wondering what she can do to help her hemoglobin improve. She is taking iron supplements. Denies nasal congestion, sore throat, shortness of breath, cough, or chest pain.  She denies any nausea, vomiting, diarrhea, or  constipation.  Denies any chills, night sweats, or unexplained weight loss.  She is here today for evaluation and repeat blood work and for decisions regarding resuming her treatment.   MEDICAL HISTORY: Past Medical History:  Diagnosis Date   Asthma    Cancer ([place])    Depression    Dyspnea    Pleural effusion on right    Pleural mass     ALLERGIES:  is allergic to cyanoacrylate, other, codeine, and lentil.  MEDICATIONS:  Current Outpatient Medications  Medication Sig Dispense Refill   Ascorbic Acid (VITAMIN C) 100 MG tablet Take 100 mg by mouth every other day.     Calcium Citrate 333 MG TABS Take 333 mg by mouth daily.     [employer] 12 HOUR NASAL DECONGESTANT 120 MG 12 hr tablet Take 120 mg by mouth daily.  10   fluticasone (FLONASE) 50 MCG/ACT nasal spray Place 1 spray into both nostrils daily as needed for allergies.  5   meloxicam (MOBIC) 15 MG tablet Take 15 mg by mouth daily as needed for pain.      montelukast (SINGULAIR) 10 MG tablet Take 10 mg by mouth daily after supper.  10   Multiple Vitamins-Iron (MULTIVITAMINS WITH IRON) TABS tablet Take 1 tablet by mouth every other day. 65 mg     ondansetron (ZOFRAN) 8 MG tablet TAKE 1 TABLET BY MOUTH EVERY 8 HOURS AS NEEDED FOR NAUSEA OR VOMITING. (Patient taking differently: Take 8 mg by mouth every 8 (eight) hours as needed for nausea or vomiting.) 18 tablet 1   temozolomide (TEMODAR) 140 MG capsule Take 2 capsules (280 mg total) by mouth daily. Take on days 1-7 and days 15-21 of each 28 day cycle. May take on an empty stomach or at bedtime to decrease nausea & vomiting. 28 capsule 3   No current facility-administered medications for this visit.    SURGICAL HISTORY:  Past Surgical History:  Procedure Laterality Date   CHEST TUBE INSERTION Right 07/16/2018   Procedure: INSERTION PLEURAL DRAINAGE CATHETER, right;  Surgeon: Grace Isaac, MD;  Location: [place];  Service: Thoracic;  Laterality: Right;   COLONOSCOPY  2017   GUM  SURGERY  1984   GRAFTS   IR IMAGING GUIDED PORT INSERTION  10/02/2021   IR RADIOLOGIST EVAL & MGMT  10/08/2021   IR RADIOLOGIST EVAL & MGMT  10/11/2021   IR REMOVAL TUN ACCESS W/ PORT W/O FL MOD SED  10/15/2021   IR THORACENTESIS ASP PLEURAL SPACE W/IMG GUIDE  07/03/2018    REVIEW OF SYSTEMS:   Review of Systems  Constitutional: Negative for appetite change, chills, fatigue, fever and unexpected weight change.  HENT:   Negative for mouth sores, nosebleeds, sore throat and trouble swallowing.   Eyes: Negative for eye problems and icterus.  Respiratory: Negative for cough, hemoptysis, shortness of breath and wheezing.   Cardiovascular: Negative for chest pain and leg swelling.  Gastrointestinal: Negative for abdominal pain, constipation, diarrhea, nausea and vomiting.  Genitourinary: Negative for bladder incontinence, difficulty urinating, dysuria, frequency and hematuria.   Musculoskeletal: Negative for back pain, gait problem, neck pain and neck stiffness.  Skin: Negative for  itching and rash.  Neurological: Negative for dizziness, extremity weakness, gait problem, headaches, light-headedness and seizures.  Hematological: Negative for adenopathy. Does not bruise/bleed easily.  Psychiatric/Behavioral: Negative for confusion, depression and sleep disturbance. The patient is not nervous/anxious.     PHYSICAL EXAMINATION:  Blood pressure (!) 153/79, pulse (!) 111, temperature 98.2 F (36.8 C), temperature source Tympanic, resp. rate 17, height 5\' 6"  (1.676 m), weight 157 lb 6.4 oz (71.4 kg), SpO2 98 %.  ECOG PERFORMANCE STATUS: 1  Physical Exam  Constitutional: Oriented to person, place, and time and well-developed, well-nourished, and in no distress. HENT:  Head: Normocephalic and atraumatic.  Mouth/Throat: Oropharynx is clear and moist. No oropharyngeal exudate.  Eyes: Conjunctivae are normal. Right eye exhibits no discharge. Left eye exhibits no discharge. No scleral icterus.  Neck:  Normal range of motion. Neck supple.  Cardiovascular: Normal rate, regular rhythm, normal heart sounds and intact distal pulses.   Pulmonary/Chest: Effort normal and breath sounds normal. No respiratory distress. No wheezes. No rales.  Abdominal: Soft. Bowel sounds are normal. Exhibits no distension and no mass. There is no tenderness.  Musculoskeletal: Normal range of motion. Exhibits no edema.  Lymphadenopathy:    No cervical adenopathy.  Neurological: Alert and oriented to person, place, and time. Exhibits normal muscle tone. Gait normal. Coordination normal.  Skin: Skin is warm and dry. No rash noted. Not diaphoretic. No erythema. No pallor.  Psychiatric: Mood, memory and judgment normal.  Vitals reviewed.  LABORATORY DATA: Lab Results  Component Value Date   WBC 8.8 02/11/2022   HGB 10.0 (L) 02/11/2022   HCT 30.6 (L) 02/11/2022   MCV 99.0 02/11/2022   PLT 184 02/11/2022      Chemistry      Component Value Date/Time   NA 138 02/11/2022 1112   K 3.8 02/11/2022 1112   CL 102 02/11/2022 1112   CO2 30 02/11/2022 1112   BUN 14 02/11/2022 1112   CREATININE 0.74 02/11/2022 1112      Component Value Date/Time   CALCIUM 10.1 02/11/2022 1112   ALKPHOS 90 02/11/2022 1112   AST 15 02/11/2022 1112   ALT 9 02/11/2022 1112   BILITOT 0.4 02/11/2022 1112       RADIOGRAPHIC STUDIES:  No results found.   ASSESSMENT/PLAN:  This is a very pleasant 61 years old Caucasian female presented with large right lower lobe lung mass in addition to right pleural effusion.  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 The patient was referred to Dr. Angelina Ok at [facility] for second opinion and she underwent several studies and intervention as listed below  1) repeat biopsy on August 11, 2018 of the lung mass at [facility] and it showed atypical spindle cell proliferation. 2) on September 23, 2018 she underwent bilateral  transexternal thoracotomies for resection of the right pleural-based tumor with en bloc right lower lobe wedge resection and the pathology revealed 18 cm solitary fibrous tumor with negative margins. 3) restaging CT scan of the chest on September 14, 2019 showed new 1.5 cm soft tissue lesion along the right subpulmonic diaphragmatic pleural surface worrisome for tumor recurrence.  She was followed by observation by Dr. Angelina Ok at [facility] 4) SBRT to the right lower lobe/diaphragmatic lesion completed June 22, 2020. 5) restaging scan on November 30, 2020 showed new right posterior pleural-based nodule followed by observation.  But repeat CT scan of the chest on 02/07/2021 showed enlarging right pleural  mass/nodules. 6) the patient is started treatment with pazopanib on 02/17/2021 400 mg titrated to 800 mg p.o. daily but her treatment was held between July 8 to March 05, 2021 secondary to fatigue and arthralgia.  She resumed her treatment at a dose of 400 mg p.o. daily for 3 days titrated to 600 mg p.o. daily but this was again held in mid August 2022.  She resumed her treatment on 04/23/2021 at 400 mg p.o. daily but this was discontinued in October 2022 secondary to disease progression was a scan on 05/30/2021 revealing progressive multifocal pleural-based disease.. There was also a scan on 09/20/21 which showed progressive multifocal pleural based disease.   Dr. Angelina Ok recommended she consider treatment with Avastin and Temodar. She requested delaying starting treatment until after the holidays. She is on Avastin 5 mg/kg milligrams on days 8 and 22 every 4 weeks with Temodar p.o on days 1-7 and 15-21 every 4 weeks. She is status post 2 cycles.  Her last dose of Temodar was on 12/09/2021.    She has been having significant thrombocytopenia requiring platelet transfusions.  She had significant reactions to platelet transfusions which now requirepremedications with Tylenol 650 mg, Benadryl 50  milligrams, Solu-Medrol 125, Pepcid 20.    Had a repeat CT scan at [facility] which showed stable disease.  She saw Dr. Angelina Ok on 12/27/2021 he discussed he would recommend continuing to hold Temodar and Avastin until her platelet count is above 50k. At that time Avastin can be resumed every 2 weeks.  Would not recommend resuming Temodar until his platelet count is above 100,000 even then would strongly consider a 50% dose reduction if the decision is made to rechallenge.  The patient was seen with Dr. Julien Nordmann today.  Labs were reviewed.  Her labs show Hbg of 10.0, Normal WBC and normal platelet count at 184k.  Dr. Julien Nordmann recommends resuming her temodar at 50% dose reduction. The patient has temodar at home and will resume this today. We will monitor her labs closely on a weekly basis.   We will see her back for follow-up visit in 2 weeks before starting her second week of temodar.   We will schedule day 8 of avastin next week on 02/18/22. We will arrange for this to be in isolation given her COVID 19 diagnosis on 01/29/22.   She has a refill on Zofran and will call the pharmacy to pick this up.   The patient was advised to call immediately if she has any concerning symptoms in the interval. The patient voices understanding of current disease status and treatment options and is in agreement with the current care plan. All questions were answered. The patient knows to call the clinic with any problems, questions or concerns. We can certainly see the patient much sooner if necessary  No orders of the defined types were placed in this encounter.    Vernestine Brodhead L Genevra Orne, PA-C 02/11/22  ADDENDUM: Hematology/Oncology Attending: I had a face-to-face encounter with the patient today.  I reviewed her records, lab and recommended her care plan.  This is a very pleasant 61 years old white female with malignant neoplasm with sarcomatoid features presented as large right lower lobe lung mass with recurrent  right pleural effusion diagnosed in December 2019.  She is status post drainage of the recurrent pleural effusion and repeat biopsy on August 11, 2018 of the lung mass at [facility] showed atypical spindle cell proliferation.  She underwent bilateral trans external thoracotomies for resection of the  right pleural-based tumor with en bloc right lower lobe wedge resection and the final pathology was consistent with 18 cm solitary fibrous tumor with negative margins.  Unfortunately the patient had evidence for disease progression in October 2021 and she underwent SBRT to recurrent disease at the right lower lobe/diaphragmatic lesion.  She was also treated with tyrosine kinase inhibitor initially with Brigatinib (Alungbrig) discontinued and October 2022 secondary to disease progression with multifocal pleural-based disease. The patient has started treatment with Avastin and Temodar as recommended by Dr. Angelina Ok at [facility].  She is status post 2 cycles but her treatment was discontinued and has been on hold for several weeks now secondary to pancytopenia with persistently low platelets count and anemia that lasted for several weeks. The patient has improved over the last few weeks with platelets count over 100 and hemoglobin in the range of 10.0. We are planning to resume her treatment but unfortunately the patient was diagnosed with COVID-19 around 10 days ago. Treatment was on hold until improvement of her condition. She came to the clinic today for evaluation and she is feeling much better.  Repeat CBC today showed hemoglobin of 10.0 and hematocrit 30.6% with platelets count of 184 and normal total white blood count of 8.8. I recommended for the patient to resume her treatment with Temodar but at 50% of the previous dose which will be Temodar 140 mg p.o. daily on days 1-7 as well as day 15-21 every 4 weeks.  She will resume her treatment with Avastin on day 8 and day 22 of every  cycle.  Her first dose of Avastin will be next Monday. We will continue to monitor her blood work closely during her treatment on a weekly basis. If the patient develop significant pancytopenia again with the reduced dose Temodar, we may have to discontinue this treatment and discuss other treatment options with Dr. Angelina Ok. The patient will come back for follow-up visit in 2 weeks for evaluation. She was advised to call immediately if she has any other concerning symptoms in the interval.  The total time spent in the appointment was 30 minutes. Disclaimer: This note was dictated with voice recognition software. Similar sounding words can inadvertently be transcribed and may be missed upon review. Eilleen Kempf, MD

## 2022-02-11 ENCOUNTER — Other Ambulatory Visit: Payer: Self-pay

## 2022-02-11 ENCOUNTER — Inpatient Hospital Stay: Payer: BC Managed Care – PPO

## 2022-02-11 ENCOUNTER — Inpatient Hospital Stay (HOSPITAL_BASED_OUTPATIENT_CLINIC_OR_DEPARTMENT_OTHER): Payer: BC Managed Care – PPO | Admitting: Physician Assistant

## 2022-02-11 VITALS — BP 153/79 | HR 111 | Temp 98.2°F | Resp 17 | Ht 66.0 in | Wt 157.4 lb

## 2022-02-11 DIAGNOSIS — D492 Neoplasm of unspecified behavior of bone, soft tissue, and skin: Secondary | ICD-10-CM

## 2022-02-11 DIAGNOSIS — C3491 Malignant neoplasm of unspecified part of right bronchus or lung: Secondary | ICD-10-CM | POA: Diagnosis not present

## 2022-02-11 DIAGNOSIS — C3431 Malignant neoplasm of lower lobe, right bronchus or lung: Secondary | ICD-10-CM | POA: Diagnosis not present

## 2022-02-11 LAB — CBC WITH DIFFERENTIAL (CANCER CENTER ONLY)
Abs Immature Granulocytes: 0.03 10*3/uL (ref 0.00–0.07)
Basophils Absolute: 0.1 10*3/uL (ref 0.0–0.1)
Basophils Relative: 1 %
Eosinophils Absolute: 0.2 10*3/uL (ref 0.0–0.5)
Eosinophils Relative: 2 %
HCT: 30.6 % — ABNORMAL LOW (ref 36.0–46.0)
Hemoglobin: 10 g/dL — ABNORMAL LOW (ref 12.0–15.0)
Immature Granulocytes: 0 %
Lymphocytes Relative: 16 %
Lymphs Abs: 1.4 10*3/uL (ref 0.7–4.0)
MCH: 32.4 pg (ref 26.0–34.0)
MCHC: 32.7 g/dL (ref 30.0–36.0)
MCV: 99 fL (ref 80.0–100.0)
Monocytes Absolute: 0.9 10*3/uL (ref 0.1–1.0)
Monocytes Relative: 10 %
Neutro Abs: 6.2 10*3/uL (ref 1.7–7.7)
Neutrophils Relative %: 71 %
Platelet Count: 184 10*3/uL (ref 150–400)
RBC: 3.09 MIL/uL — ABNORMAL LOW (ref 3.87–5.11)
RDW: 19.9 % — ABNORMAL HIGH (ref 11.5–15.5)
WBC Count: 8.8 10*3/uL (ref 4.0–10.5)
nRBC: 0 % (ref 0.0–0.2)

## 2022-02-11 LAB — CMP (CANCER CENTER ONLY)
ALT: 9 U/L (ref 0–44)
AST: 15 U/L (ref 15–41)
Albumin: 4.1 g/dL (ref 3.5–5.0)
Alkaline Phosphatase: 90 U/L (ref 38–126)
Anion gap: 6 (ref 5–15)
BUN: 14 mg/dL (ref 6–20)
CO2: 30 mmol/L (ref 22–32)
Calcium: 10.1 mg/dL (ref 8.9–10.3)
Chloride: 102 mmol/L (ref 98–111)
Creatinine: 0.74 mg/dL (ref 0.44–1.00)
GFR, Estimated: >60 mL/min (ref >60)
Glucose, Bld: 98 mg/dL (ref 70–99)
Potassium: 3.8 mmol/L (ref 3.5–5.1)
Sodium: 138 mmol/L (ref 135–145)
Total Bilirubin: 0.4 mg/dL (ref 0.3–1.2)
Total Protein: 8.2 g/dL — ABNORMAL HIGH (ref 6.5–8.1)

## 2022-02-11 LAB — SAMPLE TO BLOOD BANK

## 2022-02-11 LAB — TOTAL PROTEIN, URINE DIPSTICK: Protein, ur: NEGATIVE mg/dL

## 2022-02-14 ENCOUNTER — Inpatient Hospital Stay: Payer: BC Managed Care – PPO | Admitting: Physician Assistant

## 2022-02-14 ENCOUNTER — Inpatient Hospital Stay: Payer: BC Managed Care – PPO

## 2022-02-15 ENCOUNTER — Telehealth: Payer: Self-pay | Admitting: Internal Medicine

## 2022-02-18 ENCOUNTER — Inpatient Hospital Stay: Payer: BC Managed Care – PPO

## 2022-02-18 VITALS — BP 142/76 | HR 103 | Temp 98.7°F | Resp 16 | Wt 159.0 lb

## 2022-02-18 DIAGNOSIS — C3431 Malignant neoplasm of lower lobe, right bronchus or lung: Secondary | ICD-10-CM | POA: Diagnosis not present

## 2022-02-18 DIAGNOSIS — C3491 Malignant neoplasm of unspecified part of right bronchus or lung: Secondary | ICD-10-CM

## 2022-02-18 DIAGNOSIS — D492 Neoplasm of unspecified behavior of bone, soft tissue, and skin: Secondary | ICD-10-CM

## 2022-02-18 LAB — CMP (CANCER CENTER ONLY)
ALT: 11 U/L (ref 0–44)
AST: 19 U/L (ref 15–41)
Albumin: 3.9 g/dL (ref 3.5–5.0)
Alkaline Phosphatase: 81 U/L (ref 38–126)
Anion gap: 6 (ref 5–15)
BUN: 14 mg/dL (ref 6–20)
CO2: 31 mmol/L (ref 22–32)
Calcium: 9.4 mg/dL (ref 8.9–10.3)
Chloride: 102 mmol/L (ref 98–111)
Creatinine: 0.85 mg/dL (ref 0.44–1.00)
GFR, Estimated: >60 mL/min (ref >60)
Glucose, Bld: 111 mg/dL — ABNORMAL HIGH (ref 70–99)
Potassium: 3.9 mmol/L (ref 3.5–5.1)
Sodium: 139 mmol/L (ref 135–145)
Total Bilirubin: 0.3 mg/dL (ref 0.3–1.2)
Total Protein: 7.2 g/dL (ref 6.5–8.1)

## 2022-02-18 LAB — CBC WITH DIFFERENTIAL (CANCER CENTER ONLY)
Abs Immature Granulocytes: 0.01 10*3/uL (ref 0.00–0.07)
Basophils Absolute: 0 10*3/uL (ref 0.0–0.1)
Basophils Relative: 1 %
Eosinophils Absolute: 0.2 10*3/uL (ref 0.0–0.5)
Eosinophils Relative: 3 %
HCT: 29.5 % — ABNORMAL LOW (ref 36.0–46.0)
Hemoglobin: 9.3 g/dL — ABNORMAL LOW (ref 12.0–15.0)
Immature Granulocytes: 0 %
Lymphocytes Relative: 15 %
Lymphs Abs: 0.7 10*3/uL (ref 0.7–4.0)
MCH: 32.2 pg (ref 26.0–34.0)
MCHC: 31.5 g/dL (ref 30.0–36.0)
MCV: 102.1 fL — ABNORMAL HIGH (ref 80.0–100.0)
Monocytes Absolute: 0.5 10*3/uL (ref 0.1–1.0)
Monocytes Relative: 10 %
Neutro Abs: 3.5 10*3/uL (ref 1.7–7.7)
Neutrophils Relative %: 71 %
Platelet Count: 186 10*3/uL (ref 150–400)
RBC: 2.89 MIL/uL — ABNORMAL LOW (ref 3.87–5.11)
RDW: 19.1 % — ABNORMAL HIGH (ref 11.5–15.5)
WBC Count: 4.9 10*3/uL (ref 4.0–10.5)
nRBC: 0 % (ref 0.0–0.2)

## 2022-02-18 LAB — SAMPLE TO BLOOD BANK

## 2022-02-18 MED ORDER — SODIUM CHLORIDE 0.9 % IV SOLN
5.0000 mg/kg | Freq: Once | INTRAVENOUS | Status: AC
  Administered 2022-02-18: 350 mg via INTRAVENOUS
  Filled 2022-02-18: qty 14

## 2022-02-18 MED ORDER — SODIUM CHLORIDE 0.9 % IV SOLN: Freq: Once | INTRAVENOUS | Status: AC

## 2022-02-25 ENCOUNTER — Other Ambulatory Visit: Payer: Self-pay

## 2022-02-25 ENCOUNTER — Inpatient Hospital Stay (HOSPITAL_BASED_OUTPATIENT_CLINIC_OR_DEPARTMENT_OTHER): Payer: BC Managed Care – PPO | Admitting: Internal Medicine

## 2022-02-25 ENCOUNTER — Other Ambulatory Visit (HOSPITAL_COMMUNITY): Payer: Self-pay

## 2022-02-25 ENCOUNTER — Inpatient Hospital Stay: Payer: BC Managed Care – PPO | Attending: Internal Medicine

## 2022-02-25 VITALS — BP 145/82 | HR 99 | Temp 97.9°F | Resp 18 | Ht 66.0 in | Wt 156.6 lb

## 2022-02-25 DIAGNOSIS — D6959 Other secondary thrombocytopenia: Secondary | ICD-10-CM | POA: Insufficient documentation

## 2022-02-25 DIAGNOSIS — M2550 Pain in unspecified joint: Secondary | ICD-10-CM | POA: Insufficient documentation

## 2022-02-25 DIAGNOSIS — C3491 Malignant neoplasm of unspecified part of right bronchus or lung: Secondary | ICD-10-CM

## 2022-02-25 DIAGNOSIS — Z885 Allergy status to narcotic agent status: Secondary | ICD-10-CM | POA: Diagnosis not present

## 2022-02-25 DIAGNOSIS — D492 Neoplasm of unspecified behavior of bone, soft tissue, and skin: Secondary | ICD-10-CM | POA: Diagnosis not present

## 2022-02-25 DIAGNOSIS — Z79899 Other long term (current) drug therapy: Secondary | ICD-10-CM | POA: Insufficient documentation

## 2022-02-25 DIAGNOSIS — Z5111 Encounter for antineoplastic chemotherapy: Secondary | ICD-10-CM

## 2022-02-25 DIAGNOSIS — C3431 Malignant neoplasm of lower lobe, right bronchus or lung: Secondary | ICD-10-CM | POA: Diagnosis not present

## 2022-02-25 DIAGNOSIS — R5383 Other fatigue: Secondary | ICD-10-CM | POA: Diagnosis not present

## 2022-02-25 DIAGNOSIS — D61818 Other pancytopenia: Secondary | ICD-10-CM | POA: Diagnosis not present

## 2022-02-25 DIAGNOSIS — T451X5A Adverse effect of antineoplastic and immunosuppressive drugs, initial encounter: Secondary | ICD-10-CM | POA: Insufficient documentation

## 2022-02-25 DIAGNOSIS — Z5112 Encounter for antineoplastic immunotherapy: Secondary | ICD-10-CM | POA: Insufficient documentation

## 2022-02-25 DIAGNOSIS — Z79630 Long term (current) use of alkylating agent: Secondary | ICD-10-CM | POA: Diagnosis not present

## 2022-02-25 DIAGNOSIS — J45909 Unspecified asthma, uncomplicated: Secondary | ICD-10-CM | POA: Insufficient documentation

## 2022-02-25 DIAGNOSIS — D61810 Antineoplastic chemotherapy induced pancytopenia: Secondary | ICD-10-CM | POA: Insufficient documentation

## 2022-02-25 LAB — CBC WITH DIFFERENTIAL (CANCER CENTER ONLY)
Abs Immature Granulocytes: 0.01 10*3/uL (ref 0.00–0.07)
Basophils Absolute: 0 10*3/uL (ref 0.0–0.1)
Basophils Relative: 0 %
Eosinophils Absolute: 0.3 10*3/uL (ref 0.0–0.5)
Eosinophils Relative: 5 %
HCT: 32.5 % — ABNORMAL LOW (ref 36.0–46.0)
Hemoglobin: 10.4 g/dL — ABNORMAL LOW (ref 12.0–15.0)
Immature Granulocytes: 0 %
Lymphocytes Relative: 18 %
Lymphs Abs: 0.9 10*3/uL (ref 0.7–4.0)
MCH: 32.6 pg (ref 26.0–34.0)
MCHC: 32 g/dL (ref 30.0–36.0)
MCV: 101.9 fL — ABNORMAL HIGH (ref 80.0–100.0)
Monocytes Absolute: 0.5 10*3/uL (ref 0.1–1.0)
Monocytes Relative: 9 %
Neutro Abs: 3.4 10*3/uL (ref 1.7–7.7)
Neutrophils Relative %: 68 %
Platelet Count: 166 10*3/uL (ref 150–400)
RBC: 3.19 MIL/uL — ABNORMAL LOW (ref 3.87–5.11)
RDW: 17.9 % — ABNORMAL HIGH (ref 11.5–15.5)
WBC Count: 5.1 10*3/uL (ref 4.0–10.5)
nRBC: 0 % (ref 0.0–0.2)

## 2022-02-25 LAB — CMP (CANCER CENTER ONLY)
ALT: 11 U/L (ref 0–44)
AST: 20 U/L (ref 15–41)
Albumin: 4.1 g/dL (ref 3.5–5.0)
Alkaline Phosphatase: 80 U/L (ref 38–126)
Anion gap: 5 (ref 5–15)
BUN: 13 mg/dL (ref 6–20)
CO2: 30 mmol/L (ref 22–32)
Calcium: 9.6 mg/dL (ref 8.9–10.3)
Chloride: 104 mmol/L (ref 98–111)
Creatinine: 0.7 mg/dL (ref 0.44–1.00)
GFR, Estimated: >60 mL/min (ref >60)
Glucose, Bld: 98 mg/dL (ref 70–99)
Potassium: 4.2 mmol/L (ref 3.5–5.1)
Sodium: 139 mmol/L (ref 135–145)
Total Bilirubin: 0.4 mg/dL (ref 0.3–1.2)
Total Protein: 7.5 g/dL (ref 6.5–8.1)

## 2022-02-25 LAB — TOTAL PROTEIN, URINE DIPSTICK: Protein, ur: NEGATIVE mg/dL

## 2022-02-25 LAB — SAMPLE TO BLOOD BANK

## 2022-02-25 MED ORDER — TEMOZOLOMIDE 140 MG PO CAPS
140.0000 mg | ORAL_CAPSULE | Freq: Every day | ORAL | 3 refills | Status: DC
  Filled 2022-02-25: qty 14, 28d supply, fill #0
  Filled 2022-03-22 – 2022-04-04 (×2): qty 14, 28d supply, fill #1
  Filled 2022-05-30: qty 14, 28d supply, fill #2
  Filled 2022-08-05: qty 14, 28d supply, fill #3

## 2022-02-25 NOTE — Progress Notes (Signed)
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  PRIOR THERAPY:  1) Status post right Pleurx catheter placement for drainage of recurrent right pleural effusion. 2) repeat biopsy on August 11, 2018 of the lung mass at [facility] and it showed atypical spindle cell proliferation. 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 4) restaging CT scan of the chest on September 14, 2019 showed new 1.5 cm soft tissue lesion along the right subpulmonic diaphragmatic pleural surface worrisome for tumor recurrence.  She was followed by observation by Dr. Angelina Patel at [facility] 5) SBRT to the right lower lobe/diaphragmatic lesion completed June 22, 2020. 6) restaging scan on November 30, 2020 showed new right posterior pleural-based nodule followed by observation.  But repeat CT scan of the chest on 02/07/2021 showed enlarging right pleural mass/nodules. 7) the patient is started treatment with pazopanib on 02/17/2021 400 mg titrated to 800 mg p.o. daily but her treatment was held between July 8 to March 05, 2021 secondary to fatigue and arthralgia.  She resumed her treatment at a dose of 400 mg p.o. daily for 3 days titrated to 600 mg p.o. daily but this was again held in mid August 2022.  She resumed her treatment on 04/23/2021 at 400 mg p.o. daily but this was discontinued in October 2022 secondary to disease progression was a scan on 05/30/2021 revealing progressive multifocal pleural-based disease.  CURRENT  THERAPY: Temodar 150 Mg/M2 daily for days 1-7 and 15-21 every 4 weeks in addition to a Avastin 5 Mg/KG on days 8 and 22 every 4 weeks.  Started October 15, 2021.  Starting from cycle #3 her dose of Temodar was reduced to 140 mg once daily on days 1-7 and 15-21 every 4 weeks.  Status post 3 cycles of treatment.  INTERVAL HISTORY: Brenda Patel 61 y.o. female returns to the clinic today for follow-up visit.  The patient is feeling fine today with no concerning complaints except for mild fatigue.  She denied having any chest pain, shortness of breath, cough or hemoptysis.  She denied having any fever or chills.  She has no nausea, vomiting, diarrhea or constipation.  She has no headache or visual changes.  Her treatment has been on hold for several weeks secondary to significant chemotherapy-induced pancytopenia especially thrombocytopenia that lasted for several weeks before improvement.  Her dose of Temodar was reduced to 140 mg p.o. daily on days 1-7 and 15-21 every 4 weeks.  She is tolerating the reduced dose much better.  She is here today for evaluation before starting her treatment with repeat blood work.   MEDICAL HISTORY: Past Medical History:  Diagnosis Date   Asthma    Cancer ([place])    Depression    Dyspnea    Pleural effusion on right    Pleural mass     ALLERGIES:  is allergic to cyanoacrylate, other, codeine, and lentil.  MEDICATIONS:  Current Outpatient Medications  Medication Sig Dispense Refill   Ascorbic Acid (VITAMIN C) 100 MG tablet Take 100 mg by mouth every other day.  Calcium Citrate 333 MG TABS Take 333 mg by mouth daily.     [employer] 12 HOUR NASAL DECONGESTANT 120 MG 12 hr tablet Take 120 mg by mouth daily.  10   fluticasone (FLONASE) 50 MCG/ACT nasal spray Place 1 spray into both nostrils daily as needed for allergies.  5   meloxicam (MOBIC) 15 MG tablet Take 15 mg by mouth daily as needed for pain.      montelukast (SINGULAIR) 10 MG tablet Take 10 mg by mouth daily  after supper.  10   Multiple Vitamins-Iron (MULTIVITAMINS WITH IRON) TABS tablet Take 1 tablet by mouth every other day. 65 mg     ondansetron (ZOFRAN) 8 MG tablet TAKE 1 TABLET BY MOUTH EVERY 8 HOURS AS NEEDED FOR NAUSEA OR VOMITING. (Patient taking differently: Take 8 mg by mouth every 8 (eight) hours as needed for nausea or vomiting.) 18 tablet 1   temozolomide (TEMODAR) 140 MG capsule Take 2 capsules (280 mg total) by mouth daily. Take on days 1-7 and days 15-21 of each 28 day cycle. May take on an empty stomach or at bedtime to decrease nausea & vomiting. (Patient taking differently: Take 140 mg/m2/day by mouth daily. Take on days 1-7 and days 15-21 of each 28 day cycle. May take on an empty stomach or at bedtime to decrease nausea & vomiting.) 28 capsule 3   No current facility-administered medications for this visit.    SURGICAL HISTORY:  Past Surgical History:  Procedure Laterality Date   CHEST TUBE INSERTION Right 07/16/2018   Procedure: INSERTION PLEURAL DRAINAGE CATHETER, right;  Surgeon: Brenda Isaac, MD;  Location: [facility];  Service: Thoracic;  Laterality: Right;   COLONOSCOPY  2017   GUM SURGERY  1984   GRAFTS   IR IMAGING GUIDED PORT INSERTION  10/02/2021   IR RADIOLOGIST EVAL & MGMT  10/08/2021   IR RADIOLOGIST EVAL & MGMT  10/11/2021   IR REMOVAL TUN ACCESS W/ PORT W/O FL MOD SED  10/15/2021   IR THORACENTESIS ASP PLEURAL SPACE W/IMG GUIDE  07/03/2018    REVIEW OF SYSTEMS:  A comprehensive review of systems was negative except for: Constitutional: positive for fatigue   PHYSICAL EXAMINATION: General appearance: alert, cooperative, and no distress Head: Normocephalic, without obvious abnormality, atraumatic Neck: no adenopathy, no JVD, supple, symmetrical, trachea midline, and thyroid not enlarged, symmetric, no tenderness/mass/nodules Lymph nodes: Cervical, supraclavicular, and axillary nodes normal. Resp: clear to auscultation bilaterally Back: symmetric, no curvature.  ROM normal. No CVA tenderness. Cardio: regular rate and rhythm, S1, S2 normal, no murmur, click, rub or gallop GI: soft, non-tender; bowel sounds normal; no masses,  no organomegaly Extremities: extremities normal, atraumatic, no cyanosis or edema  ECOG PERFORMANCE STATUS: 1 - Symptomatic but completely ambulatory  Blood pressure (!) 145/82, pulse 99, temperature 97.9 F (36.6 C), temperature source Oral, resp. rate 18, height 5\' 6"  (1.676 m), weight 156 lb 9.6 oz (71 kg), SpO2 100 %.  LABORATORY DATA: Lab Results  Component Value Date   WBC 5.1 02/25/2022   HGB 10.4 (L) 02/25/2022   HCT 32.5 (L) 02/25/2022   MCV 101.9 (H) 02/25/2022   PLT 166 02/25/2022      Chemistry      Component Value Date/Time   NA 139 02/25/2022 1011   K 4.2 02/25/2022 1011   CL 104 02/25/2022 1011   CO2 30 02/25/2022 1011   BUN 13 02/25/2022 1011   CREATININE 0.70 02/25/2022 1011      Component Value  Date/Time   CALCIUM 9.6 02/25/2022 1011   ALKPHOS 80 02/25/2022 1011   AST 20 02/25/2022 1011   ALT 11 02/25/2022 1011   BILITOT 0.4 02/25/2022 1011       RADIOGRAPHIC STUDIES: No results found.   ASSESSMENT AND PLAN: This is a very pleasant 61 years old white female presented with large right lower lobe lung mass in addition to right pleural effusion.  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 The patient was referred to Dr. Angelina Patel at [facility] for second opinion and she underwent several studies and intervention as listed below 1) repeat biopsy on August 11, 2018 of the lung mass at [facility] and it showed atypical spindle cell proliferation. 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 3) restaging CT scan of the chest  on September 14, 2019 showed new 1.5 cm soft tissue lesion along the right subpulmonic diaphragmatic pleural surface worrisome for tumor recurrence.  She was followed by observation by Dr. Angelina Patel at [facility] 4) SBRT to the right lower lobe/diaphragmatic lesion completed June 22, 2020. 5) restaging scan on November 30, 2020 showed new right posterior pleural-based nodule followed by observation.  But repeat CT scan of the chest on 02/07/2021 showed enlarging right pleural mass/nodules. 6) the patient is started treatment with pazopanib on 02/17/2021 400 mg titrated to 800 mg p.o. daily but her treatment was held between July 8 to March 05, 2021 secondary to fatigue and arthralgia.  She resumed her treatment at a dose of 400 mg p.o. daily for 3 days titrated to 600 mg p.o. daily but this was again held in mid August 2022.  She resumed her treatment on 04/23/2021 at 400 mg p.o. daily but this was discontinued in October 2022 secondary to disease progression was a scan on 05/30/2021 revealing progressive multifocal pleural-based disease. Dr. Angelina Patel recommended for the patient treatment with Temodar and Avastin.  She started Temodar 150 Mg/M2 on days 1 and 7 as well as day 15-21 in addition to Avastin 5 Mg/KG on days 1 and 22 every 4 weeks.  She is status post 2 cycles.   Her treatment was complicated with significant pancytopenia especially thrombocytopenia secondary to the treatment with Temodar.  It took her several weeks to recover from this adverse effects. She resumed her treatment recently with reduced dose Temodar 140 mg p.o. on days 1-7 and 15-22 and she is tolerating it much better. I recommended for the patient to continue her treatment as planned. She will come back for follow-up visit in 2 weeks for evaluation before starting day 15 of her treatment. The patient was advised to call immediately if she has any concerning symptoms in the interval. The patient voices understanding of  current disease status and treatment options and is in agreement with the current care plan.  All questions were answered. The patient knows to call the clinic with any problems, questions or concerns. We can certainly see the patient much sooner if necessary.  The total time spent in the appointment was 25 minutes.  Disclaimer: This note was dictated with voice recognition software. Similar sounding words can inadvertently be transcribed and may not be corrected upon review.

## 2022-03-04 ENCOUNTER — Inpatient Hospital Stay: Payer: BC Managed Care – PPO

## 2022-03-04 ENCOUNTER — Other Ambulatory Visit (HOSPITAL_COMMUNITY): Payer: Self-pay

## 2022-03-04 ENCOUNTER — Other Ambulatory Visit: Payer: Self-pay

## 2022-03-04 VITALS — BP 136/76 | HR 90 | Temp 97.8°F | Resp 18 | Wt 157.8 lb

## 2022-03-04 DIAGNOSIS — C3491 Malignant neoplasm of unspecified part of right bronchus or lung: Secondary | ICD-10-CM

## 2022-03-04 DIAGNOSIS — C3431 Malignant neoplasm of lower lobe, right bronchus or lung: Secondary | ICD-10-CM | POA: Diagnosis not present

## 2022-03-04 DIAGNOSIS — D492 Neoplasm of unspecified behavior of bone, soft tissue, and skin: Secondary | ICD-10-CM

## 2022-03-04 LAB — CMP (CANCER CENTER ONLY)
ALT: 15 U/L (ref 0–44)
AST: 25 U/L (ref 15–41)
Albumin: 4.3 g/dL (ref 3.5–5.0)
Alkaline Phosphatase: 75 U/L (ref 38–126)
Anion gap: 5 (ref 5–15)
BUN: 14 mg/dL (ref 6–20)
CO2: 32 mmol/L (ref 22–32)
Calcium: 9.8 mg/dL (ref 8.9–10.3)
Chloride: 102 mmol/L (ref 98–111)
Creatinine: 0.89 mg/dL (ref 0.44–1.00)
GFR, Estimated: >60 mL/min (ref >60)
Glucose, Bld: 94 mg/dL (ref 70–99)
Potassium: 4 mmol/L (ref 3.5–5.1)
Sodium: 139 mmol/L (ref 135–145)
Total Bilirubin: 0.4 mg/dL (ref 0.3–1.2)
Total Protein: 7.6 g/dL (ref 6.5–8.1)

## 2022-03-04 LAB — CBC WITH DIFFERENTIAL (CANCER CENTER ONLY)
Abs Immature Granulocytes: 0.01 10*3/uL (ref 0.00–0.07)
Basophils Absolute: 0 10*3/uL (ref 0.0–0.1)
Basophils Relative: 1 %
Eosinophils Absolute: 0.2 10*3/uL (ref 0.0–0.5)
Eosinophils Relative: 4 %
HCT: 33.3 % — ABNORMAL LOW (ref 36.0–46.0)
Hemoglobin: 10.6 g/dL — ABNORMAL LOW (ref 12.0–15.0)
Immature Granulocytes: 0 %
Lymphocytes Relative: 17 %
Lymphs Abs: 0.8 10*3/uL (ref 0.7–4.0)
MCH: 32.3 pg (ref 26.0–34.0)
MCHC: 31.8 g/dL (ref 30.0–36.0)
MCV: 101.5 fL — ABNORMAL HIGH (ref 80.0–100.0)
Monocytes Absolute: 0.4 10*3/uL (ref 0.1–1.0)
Monocytes Relative: 9 %
Neutro Abs: 3.3 10*3/uL (ref 1.7–7.7)
Neutrophils Relative %: 69 %
Platelet Count: 165 10*3/uL (ref 150–400)
RBC: 3.28 MIL/uL — ABNORMAL LOW (ref 3.87–5.11)
RDW: 17.2 % — ABNORMAL HIGH (ref 11.5–15.5)
WBC Count: 4.8 10*3/uL (ref 4.0–10.5)
nRBC: 0 % (ref 0.0–0.2)

## 2022-03-04 MED ORDER — SODIUM CHLORIDE 0.9 % IV SOLN: Freq: Once | INTRAVENOUS | Status: AC

## 2022-03-04 MED ORDER — SODIUM CHLORIDE 0.9 % IV SOLN
5.0000 mg/kg | Freq: Once | INTRAVENOUS | Status: AC
  Administered 2022-03-04: 350 mg via INTRAVENOUS
  Filled 2022-03-04: qty 14

## 2022-03-11 ENCOUNTER — Other Ambulatory Visit: Payer: Self-pay | Admitting: Physician Assistant

## 2022-03-11 DIAGNOSIS — D492 Neoplasm of unspecified behavior of bone, soft tissue, and skin: Secondary | ICD-10-CM

## 2022-03-12 ENCOUNTER — Other Ambulatory Visit: Payer: Self-pay

## 2022-03-12 ENCOUNTER — Inpatient Hospital Stay (HOSPITAL_BASED_OUTPATIENT_CLINIC_OR_DEPARTMENT_OTHER): Payer: BC Managed Care – PPO | Admitting: Internal Medicine

## 2022-03-12 ENCOUNTER — Inpatient Hospital Stay: Payer: BC Managed Care – PPO

## 2022-03-12 VITALS — BP 143/81 | HR 95 | Temp 98.6°F | Wt 156.1 lb

## 2022-03-12 DIAGNOSIS — C3491 Malignant neoplasm of unspecified part of right bronchus or lung: Secondary | ICD-10-CM

## 2022-03-12 DIAGNOSIS — Z5111 Encounter for antineoplastic chemotherapy: Secondary | ICD-10-CM

## 2022-03-12 DIAGNOSIS — C3431 Malignant neoplasm of lower lobe, right bronchus or lung: Secondary | ICD-10-CM | POA: Diagnosis not present

## 2022-03-12 DIAGNOSIS — D492 Neoplasm of unspecified behavior of bone, soft tissue, and skin: Secondary | ICD-10-CM

## 2022-03-12 LAB — CMP (CANCER CENTER ONLY)
ALT: 16 U/L (ref 0–44)
AST: 25 U/L (ref 15–41)
Albumin: 4.3 g/dL (ref 3.5–5.0)
Alkaline Phosphatase: 73 U/L (ref 38–126)
Anion gap: 7 (ref 5–15)
BUN: 15 mg/dL (ref 6–20)
CO2: 29 mmol/L (ref 22–32)
Calcium: 10 mg/dL (ref 8.9–10.3)
Chloride: 103 mmol/L (ref 98–111)
Creatinine: 0.75 mg/dL (ref 0.44–1.00)
GFR, Estimated: >60 mL/min (ref >60)
Glucose, Bld: 103 mg/dL — ABNORMAL HIGH (ref 70–99)
Potassium: 3.8 mmol/L (ref 3.5–5.1)
Sodium: 139 mmol/L (ref 135–145)
Total Bilirubin: 0.4 mg/dL (ref 0.3–1.2)
Total Protein: 7.5 g/dL (ref 6.5–8.1)

## 2022-03-12 LAB — CBC WITH DIFFERENTIAL (CANCER CENTER ONLY)
Abs Immature Granulocytes: 0.01 10*3/uL (ref 0.00–0.07)
Basophils Absolute: 0 10*3/uL (ref 0.0–0.1)
Basophils Relative: 1 %
Eosinophils Absolute: 0.1 10*3/uL (ref 0.0–0.5)
Eosinophils Relative: 3 %
HCT: 33.4 % — ABNORMAL LOW (ref 36.0–46.0)
Hemoglobin: 10.9 g/dL — ABNORMAL LOW (ref 12.0–15.0)
Immature Granulocytes: 0 %
Lymphocytes Relative: 21 %
Lymphs Abs: 1 10*3/uL (ref 0.7–4.0)
MCH: 32.6 pg (ref 26.0–34.0)
MCHC: 32.6 g/dL (ref 30.0–36.0)
MCV: 100 fL (ref 80.0–100.0)
Monocytes Absolute: 0.5 10*3/uL (ref 0.1–1.0)
Monocytes Relative: 9 %
Neutro Abs: 3.3 10*3/uL (ref 1.7–7.7)
Neutrophils Relative %: 66 %
Platelet Count: 138 10*3/uL — ABNORMAL LOW (ref 150–400)
RBC: 3.34 MIL/uL — ABNORMAL LOW (ref 3.87–5.11)
RDW: 16.1 % — ABNORMAL HIGH (ref 11.5–15.5)
WBC Count: 5 10*3/uL (ref 4.0–10.5)
nRBC: 0 % (ref 0.0–0.2)

## 2022-03-12 LAB — TOTAL PROTEIN, URINE DIPSTICK: Protein, ur: NEGATIVE mg/dL

## 2022-03-12 NOTE — Progress Notes (Signed)
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  PRIOR THERAPY:  1) Status post right Pleurx catheter placement for drainage of recurrent right pleural effusion. 2) repeat biopsy on August 11, 2018 of the lung mass at [facility] and it showed atypical spindle cell proliferation. 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 4) restaging CT scan of the chest on September 14, 2019 showed new 1.5 cm soft tissue lesion along the right subpulmonic diaphragmatic pleural surface worrisome for tumor recurrence.  She was followed by observation by Dr. Angelina Ok at [facility] 5) SBRT to the right lower lobe/diaphragmatic lesion completed June 22, 2020. 6) restaging scan on November 30, 2020 showed new right posterior pleural-based nodule followed by observation.  But repeat CT scan of the chest on 02/07/2021 showed enlarging right pleural mass/nodules. 7) the patient is started treatment with pazopanib on 02/17/2021 400 mg titrated to 800 mg p.o. daily but her treatment was held between July 8 to March 05, 2021 secondary to fatigue and arthralgia.  She resumed her treatment at a dose of 400 mg p.o. daily for 3 days titrated to 600 mg p.o. daily but this was again held in mid August 2022.  She resumed her treatment on 04/23/2021 at 400 mg p.o. daily but this was discontinued in October 2022 secondary to disease progression was a scan on 05/30/2021 revealing progressive multifocal pleural-based disease.  CURRENT  THERAPY: Temodar 150 Mg/M2 daily for days 1-7 and 15-21 every 4 weeks in addition to a Avastin 5 Mg/KG on days 8 and 22 every 4 weeks.  Started October 15, 2021.  Starting from cycle #3 her dose of Temodar was reduced to 140 mg once daily on days 1-7 and 15-21 every 4 weeks.  Status post 3 cycles of treatment.  INTERVAL HISTORY: Brenda Patel 61 y.o. female returns to the clinic today for follow-up visit.  The patient is feeling fine today with no concerning complaints except for some bruising and ecchymosis from the IV sites.  We tried Port-A-Cath in the past but she has allergic reaction to the Port-A-Cath and this was removed.  She is concerned about the frequency of her treatment with Avastin every 2 weeks and she would discuss with Dr. Angelina Ok if she could do it less frequently because of the IV access issue.  She has been tolerating her treatment with Temodar fairly well.  She denied having any current chest pain, shortness of breath, cough or hemoptysis.  She has no nausea, vomiting, diarrhea or constipation.  She has no headache or visual changes.  She is here today for evaluation and repeat blood work before restarting her treatment with Temodar.  MEDICAL HISTORY: Past Medical History:  Diagnosis Date   Asthma    Cancer ([place])    Depression    Dyspnea    Pleural effusion on right    Pleural mass     ALLERGIES:  is allergic to cyanoacrylate, other, codeine, and lentil.  MEDICATIONS:  Current Outpatient Medications  Medication Sig Dispense Refill   Ascorbic Acid (VITAMIN  C) 100 MG tablet Take 100 mg by mouth every other day.     Calcium Citrate 333 MG TABS Take 333 mg by mouth daily.     [employer] 12 HOUR NASAL DECONGESTANT 120 MG 12 hr tablet Take 120 mg by mouth daily.  10   fluticasone (FLONASE) 50 MCG/ACT nasal spray Place 1 spray into both nostrils daily as needed for allergies.  5   meloxicam (MOBIC) 15 MG tablet Take 15 mg by mouth daily as needed for pain.      montelukast  (SINGULAIR) 10 MG tablet Take 10 mg by mouth daily after supper.  10   Multiple Vitamins-Iron (MULTIVITAMINS WITH IRON) TABS tablet Take 1 tablet by mouth every other day. 65 mg     ondansetron (ZOFRAN) 8 MG tablet Take 1 tablet (8 mg total) by mouth every 8 (eight) hours as needed for nausea or vomiting. 30 tablet 2   temozolomide (TEMODAR) 140 MG capsule Take 1 capsule (140 mg total) by mouth daily. Take on days 1-7 and days 15-21 of each 28 day cycle. May take on an empty stomach or at bedtime to decrease nausea & vomiting. 14 capsule 3   No current facility-administered medications for this visit.    SURGICAL HISTORY:  Past Surgical History:  Procedure Laterality Date   CHEST TUBE INSERTION Right 07/16/2018   Procedure: INSERTION PLEURAL DRAINAGE CATHETER, right;  Surgeon: Grace Isaac, MD;  Location: [facility];  Service: Thoracic;  Laterality: Right;   COLONOSCOPY  2017   GUM SURGERY  1984   GRAFTS   IR IMAGING GUIDED PORT INSERTION  10/02/2021   IR RADIOLOGIST EVAL & MGMT  10/08/2021   IR RADIOLOGIST EVAL & MGMT  10/11/2021   IR REMOVAL TUN ACCESS W/ PORT W/O FL MOD SED  10/15/2021   IR THORACENTESIS ASP PLEURAL SPACE W/IMG GUIDE  07/03/2018    REVIEW OF SYSTEMS:  A comprehensive review of systems was negative except for: Constitutional: positive for fatigue Hematologic/lymphatic: positive for ecchymosis at the IV site    PHYSICAL EXAMINATION: General appearance: alert, cooperative, fatigued, and no distress Head: Normocephalic, without obvious abnormality, atraumatic Neck: no adenopathy, no JVD, supple, symmetrical, trachea midline, and thyroid not enlarged, symmetric, no tenderness/mass/nodules Lymph nodes: Cervical, supraclavicular, and axillary nodes normal. Resp: clear to auscultation bilaterally Back: symmetric, no curvature. ROM normal. No CVA tenderness. Cardio: regular rate and rhythm, S1, S2 normal, no murmur, click, rub or gallop GI: soft, non-tender; bowel sounds  normal; no masses,  no organomegaly Extremities: extremities normal, atraumatic, no cyanosis or edema  ECOG PERFORMANCE STATUS: 1 - Symptomatic but completely ambulatory  Blood pressure (!) 143/81, pulse 95, temperature 98.6 F (37 C), temperature source Temporal, weight 156 lb 1.6 oz (70.8 kg), SpO2 100 %.  LABORATORY DATA: Lab Results  Component Value Date   WBC 5.0 03/12/2022   HGB 10.9 (L) 03/12/2022   HCT 33.4 (L) 03/12/2022   MCV 100.0 03/12/2022   PLT 138 (L) 03/12/2022      Chemistry      Component Value Date/Time   NA 139 03/04/2022 1245   K 4.0 03/04/2022 1245   CL 102 03/04/2022 1245   CO2 32 03/04/2022 1245   BUN 14 03/04/2022 1245   CREATININE 0.89 03/04/2022 1245      Component Value Date/Time   CALCIUM 9.8 03/04/2022 1245   ALKPHOS 75 03/04/2022 1245   AST 25 03/04/2022 1245   ALT 15 03/04/2022 1245   BILITOT 0.4 03/04/2022 1245  RADIOGRAPHIC STUDIES: No results found.   ASSESSMENT AND PLAN: This is a very pleasant 61 years old white female presented with large right lower lobe lung mass in addition to right pleural effusion.  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 The patient was referred to Dr. Angelina Ok at [facility] for second opinion and she underwent several studies and intervention as listed below 1) repeat biopsy on August 11, 2018 of the lung mass at [facility] and it showed atypical spindle cell proliferation. 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 3) restaging CT scan of the chest on September 14, 2019 showed new 1.5 cm soft tissue lesion along the right subpulmonic diaphragmatic pleural surface worrisome for tumor recurrence.  She was followed by observation by  Dr. Angelina Ok at [facility] 4) SBRT to the right lower lobe/diaphragmatic lesion completed June 22, 2020. 5) restaging scan on November 30, 2020 showed new right posterior pleural-based nodule followed by observation.  But repeat CT scan of the chest on 02/07/2021 showed enlarging right pleural mass/nodules. 6) the patient is started treatment with pazopanib on 02/17/2021 400 mg titrated to 800 mg p.o. daily but her treatment was held between July 8 to March 05, 2021 secondary to fatigue and arthralgia.  She resumed her treatment at a dose of 400 mg p.o. daily for 3 days titrated to 600 mg p.o. daily but this was again held in mid August 2022.  She resumed her treatment on 04/23/2021 at 400 mg p.o. daily but this was discontinued in October 2022 secondary to disease progression was a scan on 05/30/2021 revealing progressive multifocal pleural-based disease. Dr. Angelina Ok recommended for the patient treatment with Temodar and Avastin.  She started Temodar 150 Mg/M2 on days 1 and 7 as well as day 15-21 in addition to Avastin 5 Mg/KG on days 1 and 22 every 4 weeks.  She is status post 2 cycles.   Her treatment was complicated with significant pancytopenia especially thrombocytopenia secondary to the treatment with Temodar.  It took her several weeks to recover from this adverse effects. She resumed her treatment recently with reduced dose Temodar 140 mg p.o. on days 1-7 and 15-22 and she is tolerating it much better.  Status post 1 cycle with reduced regimen. Repeat blood work today is unremarkable except for mild thrombocytopenia and anemia. I recommended for the patient to proceed with her treatment as planned. I will see her back for follow-up visit in 2 weeks for evaluation before starting day 15 of her treatment with Temodar. The patient will discuss with Dr. Angelina Ok consideration of reducing the frequency of Avastin to every 4 weeks if possible. She was advised to call immediately if she has any  other concerning symptoms in the interval. The patient voices understanding of current disease status and treatment options and is in agreement with the current care plan.  All questions were answered. The patient knows to call the clinic with any problems, questions or concerns. We can certainly see the patient much sooner if necessary.  The total time spent in the appointment was 20 minutes.  Disclaimer: This note was dictated with voice recognition software. Similar sounding words can inadvertently be transcribed and may not be corrected upon review.

## 2022-03-14 ENCOUNTER — Other Ambulatory Visit (HOSPITAL_COMMUNITY): Payer: Self-pay

## 2022-03-18 ENCOUNTER — Inpatient Hospital Stay: Payer: BC Managed Care – PPO

## 2022-03-18 ENCOUNTER — Other Ambulatory Visit: Payer: Self-pay

## 2022-03-18 VITALS — BP 138/74 | HR 91 | Temp 97.9°F | Resp 17

## 2022-03-18 DIAGNOSIS — C3491 Malignant neoplasm of unspecified part of right bronchus or lung: Secondary | ICD-10-CM

## 2022-03-18 DIAGNOSIS — D492 Neoplasm of unspecified behavior of bone, soft tissue, and skin: Secondary | ICD-10-CM

## 2022-03-18 DIAGNOSIS — C3431 Malignant neoplasm of lower lobe, right bronchus or lung: Secondary | ICD-10-CM | POA: Diagnosis not present

## 2022-03-18 LAB — CBC WITH DIFFERENTIAL (CANCER CENTER ONLY)
Abs Immature Granulocytes: 0.01 10*3/uL (ref 0.00–0.07)
Basophils Absolute: 0 10*3/uL (ref 0.0–0.1)
Basophils Relative: 1 %
Eosinophils Absolute: 0.2 10*3/uL (ref 0.0–0.5)
Eosinophils Relative: 4 %
HCT: 33.9 % — ABNORMAL LOW (ref 36.0–46.0)
Hemoglobin: 11.1 g/dL — ABNORMAL LOW (ref 12.0–15.0)
Immature Granulocytes: 0 %
Lymphocytes Relative: 18 %
Lymphs Abs: 0.8 10*3/uL (ref 0.7–4.0)
MCH: 32.6 pg (ref 26.0–34.0)
MCHC: 32.7 g/dL (ref 30.0–36.0)
MCV: 99.4 fL (ref 80.0–100.0)
Monocytes Absolute: 0.4 10*3/uL (ref 0.1–1.0)
Monocytes Relative: 9 %
Neutro Abs: 3 10*3/uL (ref 1.7–7.7)
Neutrophils Relative %: 68 %
Platelet Count: 126 10*3/uL — ABNORMAL LOW (ref 150–400)
RBC: 3.41 MIL/uL — ABNORMAL LOW (ref 3.87–5.11)
RDW: 15.3 % (ref 11.5–15.5)
WBC Count: 4.3 10*3/uL (ref 4.0–10.5)
nRBC: 0 % (ref 0.0–0.2)

## 2022-03-18 LAB — CMP (CANCER CENTER ONLY)
ALT: 17 U/L (ref 0–44)
AST: 26 U/L (ref 15–41)
Albumin: 4.3 g/dL (ref 3.5–5.0)
Alkaline Phosphatase: 74 U/L (ref 38–126)
Anion gap: 6 (ref 5–15)
BUN: 13 mg/dL (ref 6–20)
CO2: 29 mmol/L (ref 22–32)
Calcium: 9.9 mg/dL (ref 8.9–10.3)
Chloride: 105 mmol/L (ref 98–111)
Creatinine: 0.74 mg/dL (ref 0.44–1.00)
GFR, Estimated: >60 mL/min (ref >60)
Glucose, Bld: 113 mg/dL — ABNORMAL HIGH (ref 70–99)
Potassium: 4.1 mmol/L (ref 3.5–5.1)
Sodium: 140 mmol/L (ref 135–145)
Total Bilirubin: 0.4 mg/dL (ref 0.3–1.2)
Total Protein: 7.3 g/dL (ref 6.5–8.1)

## 2022-03-18 MED ORDER — SODIUM CHLORIDE 0.9 % IV SOLN: Freq: Once | INTRAVENOUS | Status: AC

## 2022-03-18 MED ORDER — SODIUM CHLORIDE 0.9 % IV SOLN
5.0000 mg/kg | Freq: Once | INTRAVENOUS | Status: AC
  Administered 2022-03-18: 350 mg via INTRAVENOUS
  Filled 2022-03-18: qty 14

## 2022-03-19 ENCOUNTER — Other Ambulatory Visit: Payer: Self-pay

## 2022-03-22 ENCOUNTER — Other Ambulatory Visit (HOSPITAL_COMMUNITY): Payer: Self-pay

## 2022-03-25 ENCOUNTER — Inpatient Hospital Stay (HOSPITAL_BASED_OUTPATIENT_CLINIC_OR_DEPARTMENT_OTHER): Payer: BC Managed Care – PPO | Admitting: Internal Medicine

## 2022-03-25 ENCOUNTER — Inpatient Hospital Stay: Payer: BC Managed Care – PPO

## 2022-03-25 ENCOUNTER — Encounter: Payer: Self-pay | Admitting: Internal Medicine

## 2022-03-25 ENCOUNTER — Other Ambulatory Visit: Payer: Self-pay

## 2022-03-25 VITALS — BP 145/82 | HR 87 | Temp 98.0°F | Resp 15 | Wt 156.0 lb

## 2022-03-25 DIAGNOSIS — Z5111 Encounter for antineoplastic chemotherapy: Secondary | ICD-10-CM | POA: Diagnosis not present

## 2022-03-25 DIAGNOSIS — C3491 Malignant neoplasm of unspecified part of right bronchus or lung: Secondary | ICD-10-CM | POA: Diagnosis not present

## 2022-03-25 DIAGNOSIS — D492 Neoplasm of unspecified behavior of bone, soft tissue, and skin: Secondary | ICD-10-CM

## 2022-03-25 DIAGNOSIS — C3431 Malignant neoplasm of lower lobe, right bronchus or lung: Secondary | ICD-10-CM | POA: Diagnosis not present

## 2022-03-25 LAB — CBC WITH DIFFERENTIAL (CANCER CENTER ONLY)
Abs Immature Granulocytes: 0.03 10*3/uL (ref 0.00–0.07)
Basophils Absolute: 0 10*3/uL (ref 0.0–0.1)
Basophils Relative: 1 %
Eosinophils Absolute: 0.2 10*3/uL (ref 0.0–0.5)
Eosinophils Relative: 5 %
HCT: 36 % (ref 36.0–46.0)
Hemoglobin: 11.7 g/dL — ABNORMAL LOW (ref 12.0–15.0)
Immature Granulocytes: 1 %
Lymphocytes Relative: 18 %
Lymphs Abs: 0.7 10*3/uL (ref 0.7–4.0)
MCH: 32.2 pg (ref 26.0–34.0)
MCHC: 32.5 g/dL (ref 30.0–36.0)
MCV: 99.2 fL (ref 80.0–100.0)
Monocytes Absolute: 0.4 10*3/uL (ref 0.1–1.0)
Monocytes Relative: 9 %
Neutro Abs: 2.7 10*3/uL (ref 1.7–7.7)
Neutrophils Relative %: 66 %
Platelet Count: 119 10*3/uL — ABNORMAL LOW (ref 150–400)
RBC: 3.63 MIL/uL — ABNORMAL LOW (ref 3.87–5.11)
RDW: 14.9 % (ref 11.5–15.5)
WBC Count: 4.1 10*3/uL (ref 4.0–10.5)
nRBC: 0 % (ref 0.0–0.2)

## 2022-03-25 LAB — CMP (CANCER CENTER ONLY)
ALT: 19 U/L (ref 0–44)
AST: 26 U/L (ref 15–41)
Albumin: 4.5 g/dL (ref 3.5–5.0)
Alkaline Phosphatase: 75 U/L (ref 38–126)
Anion gap: 4 — ABNORMAL LOW (ref 5–15)
BUN: 13 mg/dL (ref 6–20)
CO2: 30 mmol/L (ref 22–32)
Calcium: 9.5 mg/dL (ref 8.9–10.3)
Chloride: 105 mmol/L (ref 98–111)
Creatinine: 0.68 mg/dL (ref 0.44–1.00)
GFR, Estimated: >60 mL/min (ref >60)
Glucose, Bld: 93 mg/dL (ref 70–99)
Potassium: 3.8 mmol/L (ref 3.5–5.1)
Sodium: 139 mmol/L (ref 135–145)
Total Bilirubin: 0.5 mg/dL (ref 0.3–1.2)
Total Protein: 7.7 g/dL (ref 6.5–8.1)

## 2022-03-25 NOTE — Progress Notes (Signed)
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  PRIOR THERAPY:  1) Status post right Pleurx catheter placement for drainage of recurrent right pleural effusion. 2) repeat biopsy on August 11, 2018 of the lung mass at [facility] and it showed atypical spindle cell proliferation. 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 4) restaging CT scan of the chest on September 14, 2019 showed new 1.5 cm soft tissue lesion along the right subpulmonic diaphragmatic pleural surface worrisome for tumor recurrence.  She was followed by observation by Dr. Angelina Ok at [place] center 5) SBRT to the right lower lobe/diaphragmatic lesion completed June 22, 2020. 6) restaging scan on November 30, 2020 showed new right posterior pleural-based nodule followed by observation.  But repeat CT scan of the chest on 02/07/2021 showed enlarging right pleural mass/nodules. 7) the patient is started treatment with pazopanib on 02/17/2021 400 mg titrated to 800 mg p.o. daily but her treatment was held between July 8 to March 05, 2021 secondary to fatigue and arthralgia.  She resumed her treatment at a dose of 400 mg p.o. daily for 3 days titrated to 600 mg p.o. daily but this was again held in mid August 2022.  She resumed her treatment on 04/23/2021 at 400 mg p.o. daily but this was discontinued in October 2022 secondary to disease progression was a scan on 05/30/2021 revealing progressive multifocal pleural-based disease.  CURRENT  THERAPY: Temodar 150 Mg/M2 daily for days 1-7 and 15-21 every 4 weeks in addition to a Avastin 5 Mg/KG on days 8 and 22 every 4 weeks.  Started October 15, 2021.  Starting from cycle #3 her dose of Temodar was reduced to 140 mg once daily on days 1-7 and 15-21 every 4 weeks.  Status post 4 cycles of treatment.  INTERVAL HISTORY: Brenda Patel 61 y.o. female returns to the clinic today for follow-up visit.  The patient is feeling fine today with no concerning complaints.  She is able to exercise and walk her dog at regular basis.  She has more energy.  She denied having any chest pain, shortness of breath, cough or hemoptysis.  She has no nausea, vomiting, diarrhea or constipation.  She has no headache or visual changes.  She has no recent weight loss or night sweats.  She continues to tolerate her treatment fairly well.  She has concern about the IV access and she wished that her treatment would be done at less frequent basis.  She is scheduled to see Dr. Angelina Ok next week at [place] [facility] with repeat imaging studies for restaging of her disease.  She is here today for evaluation and repeat blood work before starting the 15 of her treatment of cycle #4  MEDICAL HISTORY: Past Medical History:  Diagnosis Date   Asthma    Cancer ([place])    Depression    Dyspnea    Pleural effusion on right    Pleural mass     ALLERGIES:  is allergic to cyanoacrylate, other, codeine, and lentil.  MEDICATIONS:  Current Outpatient Medications  Medication Sig Dispense Refill   Ascorbic Acid (VITAMIN C) 100 MG tablet Take 100 mg by mouth every other day.     Calcium Citrate 333 MG TABS Take 333 mg by mouth daily.     [employer] 12 HOUR NASAL DECONGESTANT 120 MG 12 hr tablet Take 120 mg by mouth daily.  10   fluticasone (FLONASE) 50 MCG/ACT nasal spray Place 1 spray into both nostrils daily as needed for allergies.  5   meloxicam (MOBIC) 15 MG tablet Take 15 mg by mouth daily as needed for pain.       montelukast (SINGULAIR) 10 MG tablet Take 10 mg by mouth daily after supper.  10   Multiple Vitamins-Iron (MULTIVITAMINS WITH IRON) TABS tablet Take 1 tablet by mouth every other day. 65 mg     ondansetron (ZOFRAN) 8 MG tablet Take 1 tablet (8 mg total) by mouth every 8 (eight) hours as needed for nausea or vomiting. 30 tablet 2   temozolomide (TEMODAR) 140 MG capsule Take 1 capsule (140 mg total) by mouth daily. Take on days 1-7 and days 15-21 of each 28 day cycle. May take on an empty stomach or at bedtime to decrease nausea & vomiting. 14 capsule 3   No current facility-administered medications for this visit.    SURGICAL HISTORY:  Past Surgical History:  Procedure Laterality Date   CHEST TUBE INSERTION Right 07/16/2018   Procedure: INSERTION PLEURAL DRAINAGE CATHETER, right;  Surgeon: Grace Isaac, MD;  Location: [facility];  Service: Thoracic;  Laterality: Right;   COLONOSCOPY  2017   GUM SURGERY  1984   GRAFTS   IR IMAGING GUIDED PORT INSERTION  10/02/2021   IR RADIOLOGIST EVAL & MGMT  10/08/2021   IR RADIOLOGIST EVAL & MGMT  10/11/2021   IR REMOVAL TUN ACCESS W/ PORT W/O FL MOD SED  10/15/2021   IR THORACENTESIS ASP PLEURAL SPACE W/IMG GUIDE  07/03/2018    REVIEW OF SYSTEMS:  A comprehensive review of systems was negative except for: Constitutional: positive for fatigue   PHYSICAL EXAMINATION: General appearance: alert, cooperative, and no distress Head: Normocephalic, without obvious abnormality, atraumatic Neck: no adenopathy, no JVD, supple, symmetrical, trachea midline, and thyroid not enlarged, symmetric, no tenderness/mass/nodules Lymph nodes: Cervical, supraclavicular, and axillary nodes normal. Resp: clear to auscultation bilaterally Back: symmetric, no curvature. ROM normal. No CVA tenderness. Cardio: regular rate and rhythm, S1, S2 normal, no murmur, click, rub or gallop GI: soft, non-tender; bowel sounds normal; no masses,  no organomegaly Extremities: extremities  normal, atraumatic, no cyanosis or edema  ECOG PERFORMANCE STATUS: 1 - Symptomatic but completely ambulatory  Blood pressure (!) 145/82, pulse 87, temperature 98 F (36.7 C), temperature source Oral, resp. rate 15, weight 156 lb (70.8 kg), SpO2 97 %.  LABORATORY DATA: Lab Results  Component Value Date   WBC 4.1 03/25/2022   HGB 11.7 (L) 03/25/2022   HCT 36.0 03/25/2022   MCV 99.2 03/25/2022   PLT 119 (L) 03/25/2022      Chemistry      Component Value Date/Time   NA 140 03/18/2022 1257   K 4.1 03/18/2022 1257   CL 105 03/18/2022 1257   CO2 29 03/18/2022 1257   BUN 13 03/18/2022 1257   CREATININE 0.74 03/18/2022 1257      Component Value Date/Time   CALCIUM 9.9 03/18/2022 1257   ALKPHOS 74 03/18/2022 1257   AST 26 03/18/2022 1257   ALT 17 03/18/2022 1257   BILITOT 0.4 03/18/2022 1257  RADIOGRAPHIC STUDIES: No results found.   ASSESSMENT AND PLAN: This is a very pleasant 61 years old white female presented with large right lower lobe lung mass in addition to right pleural effusion.  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 The patient was referred to Dr. Angelina Ok at [place] [facility] for second opinion and she underwent several studies and intervention as listed below 1) repeat biopsy on August 11, 2018 of the lung mass at [facility] and it showed atypical spindle cell proliferation. 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 3) restaging CT scan of the chest on September 14, 2019 showed new 1.5 cm soft tissue lesion along the right subpulmonic diaphragmatic pleural surface worrisome for tumor recurrence.  She was followed by observation by Dr. Angelina Ok at [place] [facility] 4) SBRT to the  right lower lobe/diaphragmatic lesion completed June 22, 2020. 5) restaging scan on November 30, 2020 showed new right posterior pleural-based nodule followed by observation.  But repeat CT scan of the chest on 02/07/2021 showed enlarging right pleural mass/nodules. 6) the patient is started treatment with pazopanib on 02/17/2021 400 mg titrated to 800 mg p.o. daily but her treatment was held between July 8 to March 05, 2021 secondary to fatigue and arthralgia.  She resumed her treatment at a dose of 400 mg p.o. daily for 3 days titrated to 600 mg p.o. daily but this was again held in mid August 2022.  She resumed her treatment on 04/23/2021 at 400 mg p.o. daily but this was discontinued in October 2022 secondary to disease progression was a scan on 05/30/2021 revealing progressive multifocal pleural-based disease. Dr. Angelina Ok recommended for the patient treatment with Temodar and Avastin.  She started Temodar 150 Mg/M2 on days 1 and 7 as well as day 15-21 in addition to Avastin 5 Mg/KG on days 1 and 22 every 4 weeks.  She is status post 2 cycles.   Her treatment was complicated with significant pancytopenia especially thrombocytopenia secondary to the treatment with Temodar.  It took her several weeks to recover from this adverse effects. She resumed her treatment recently with reduced dose Temodar 140 mg p.o. on days 1-7 and 15-22 and she is tolerating it much better.  Status post 2 cycle with reduced regimen. CBC and comprehensive metabolic panel performed today were unremarkable except for mild anemia with hemoglobin of 11.7 and platelets count of 119,000. I recommended for the patient to proceed with Temodar from day 15 today 22 of cycle #4 as planned. She is scheduled to see Dr. Angelina Ok in around 10 days for evaluation and repeat imaging studies for restaging of her disease.  She will discuss with him the option of decreasing the frequency of Avastin infusion to every 4 weeks because of the difficult IV  access.  She would also discussed with him if [facility] or using a different patch of Port-A-Cath that she may not be allergic to. I will see her back for follow-up visit in 2 weeks for evaluation before starting cycle #5. The patient was advised to call immediately if she has any other concerning symptoms in the interval.  The patient voices understanding of current disease status and treatment options and is in agreement with the current care plan.  All questions were answered. The patient  knows to call the clinic with any problems, questions or concerns. We can certainly see the patient much sooner if necessary.  The total time spent in the appointment was 20 minutes.  Disclaimer: This note was dictated with voice recognition software. Similar sounding words can inadvertently be transcribed and may not be corrected upon review.

## 2022-03-26 ENCOUNTER — Other Ambulatory Visit: Payer: Self-pay

## 2022-03-26 ENCOUNTER — Other Ambulatory Visit (HOSPITAL_COMMUNITY): Payer: Self-pay

## 2022-03-27 ENCOUNTER — Other Ambulatory Visit: Payer: Self-pay

## 2022-03-29 ENCOUNTER — Telehealth: Payer: Self-pay | Admitting: Internal Medicine

## 2022-03-29 NOTE — Telephone Encounter (Signed)
Scheduled per 07/31 los, patient has been called and notified of upcoming appointments.

## 2022-04-01 ENCOUNTER — Inpatient Hospital Stay: Payer: BC Managed Care – PPO | Attending: Internal Medicine

## 2022-04-01 ENCOUNTER — Inpatient Hospital Stay: Payer: BC Managed Care – PPO

## 2022-04-01 ENCOUNTER — Other Ambulatory Visit: Payer: Self-pay

## 2022-04-01 VITALS — BP 143/79 | HR 98 | Temp 98.6°F | Resp 18 | Wt 157.2 lb

## 2022-04-01 DIAGNOSIS — R5383 Other fatigue: Secondary | ICD-10-CM | POA: Diagnosis not present

## 2022-04-01 DIAGNOSIS — Z79630 Long term (current) use of alkylating agent: Secondary | ICD-10-CM | POA: Insufficient documentation

## 2022-04-01 DIAGNOSIS — Z885 Allergy status to narcotic agent status: Secondary | ICD-10-CM | POA: Diagnosis not present

## 2022-04-01 DIAGNOSIS — Z5112 Encounter for antineoplastic immunotherapy: Secondary | ICD-10-CM | POA: Insufficient documentation

## 2022-04-01 DIAGNOSIS — M2550 Pain in unspecified joint: Secondary | ICD-10-CM | POA: Insufficient documentation

## 2022-04-01 DIAGNOSIS — D492 Neoplasm of unspecified behavior of bone, soft tissue, and skin: Secondary | ICD-10-CM

## 2022-04-01 DIAGNOSIS — T7840XA Allergy, unspecified, initial encounter: Secondary | ICD-10-CM | POA: Diagnosis not present

## 2022-04-01 DIAGNOSIS — C3491 Malignant neoplasm of unspecified part of right bronchus or lung: Secondary | ICD-10-CM

## 2022-04-01 DIAGNOSIS — J45909 Unspecified asthma, uncomplicated: Secondary | ICD-10-CM | POA: Diagnosis not present

## 2022-04-01 DIAGNOSIS — C3431 Malignant neoplasm of lower lobe, right bronchus or lung: Secondary | ICD-10-CM | POA: Diagnosis present

## 2022-04-01 DIAGNOSIS — D696 Thrombocytopenia, unspecified: Secondary | ICD-10-CM | POA: Insufficient documentation

## 2022-04-01 DIAGNOSIS — Z79899 Other long term (current) drug therapy: Secondary | ICD-10-CM | POA: Diagnosis not present

## 2022-04-01 LAB — CBC WITH DIFFERENTIAL (CANCER CENTER ONLY)
Abs Immature Granulocytes: 0.05 10*3/uL (ref 0.00–0.07)
Basophils Absolute: 0 10*3/uL (ref 0.0–0.1)
Basophils Relative: 1 %
Eosinophils Absolute: 0.1 10*3/uL (ref 0.0–0.5)
Eosinophils Relative: 3 %
HCT: 36.4 % (ref 36.0–46.0)
Hemoglobin: 12.1 g/dL (ref 12.0–15.0)
Immature Granulocytes: 2 %
Lymphocytes Relative: 17 %
Lymphs Abs: 0.5 10*3/uL — ABNORMAL LOW (ref 0.7–4.0)
MCH: 32.5 pg (ref 26.0–34.0)
MCHC: 33.2 g/dL (ref 30.0–36.0)
MCV: 97.8 fL (ref 80.0–100.0)
Monocytes Absolute: 0.3 10*3/uL (ref 0.1–1.0)
Monocytes Relative: 10 %
Neutro Abs: 2.2 10*3/uL (ref 1.7–7.7)
Neutrophils Relative %: 67 %
Platelet Count: 117 10*3/uL — ABNORMAL LOW (ref 150–400)
RBC: 3.72 MIL/uL — ABNORMAL LOW (ref 3.87–5.11)
RDW: 14.4 % (ref 11.5–15.5)
WBC Count: 3.2 10*3/uL — ABNORMAL LOW (ref 4.0–10.5)
nRBC: 0 % (ref 0.0–0.2)

## 2022-04-01 LAB — CMP (CANCER CENTER ONLY)
ALT: 20 U/L (ref 0–44)
AST: 28 U/L (ref 15–41)
Albumin: 4.5 g/dL (ref 3.5–5.0)
Alkaline Phosphatase: 79 U/L (ref 38–126)
Anion gap: 6 (ref 5–15)
BUN: 14 mg/dL (ref 6–20)
CO2: 29 mmol/L (ref 22–32)
Calcium: 9.5 mg/dL (ref 8.9–10.3)
Chloride: 104 mmol/L (ref 98–111)
Creatinine: 0.79 mg/dL (ref 0.44–1.00)
GFR, Estimated: >60 mL/min (ref >60)
Glucose, Bld: 97 mg/dL (ref 70–99)
Potassium: 4 mmol/L (ref 3.5–5.1)
Sodium: 139 mmol/L (ref 135–145)
Total Bilirubin: 0.4 mg/dL (ref 0.3–1.2)
Total Protein: 7.7 g/dL (ref 6.5–8.1)

## 2022-04-01 MED ORDER — SODIUM CHLORIDE 0.9 % IV SOLN: Freq: Once | INTRAVENOUS | Status: AC

## 2022-04-01 MED ORDER — SODIUM CHLORIDE 0.9 % IV SOLN
5.0000 mg/kg | Freq: Once | INTRAVENOUS | Status: AC
  Administered 2022-04-01: 350 mg via INTRAVENOUS
  Filled 2022-04-01: qty 14

## 2022-04-04 ENCOUNTER — Other Ambulatory Visit (HOSPITAL_COMMUNITY): Payer: Self-pay

## 2022-04-04 ENCOUNTER — Telehealth: Payer: Self-pay | Admitting: Medical Oncology

## 2022-04-04 NOTE — Telephone Encounter (Signed)
  Saw Dr Angelina Ok yesterday . Brenda Patel wants to discuss changing frequency of treatment to 1 x month due to vein irritation from PIV.  Confirmed appt for next week with Cassie. I told her Dr Julien Nordmann will come in and see her.

## 2022-04-04 NOTE — Progress Notes (Signed)
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  PRIOR THERAPY: 1) Status post right Pleurx catheter placement for drainage of recurrent right pleural effusion. 2) repeat biopsy on August 11, 2018 of the lung mass at [facility] and it showed atypical spindle cell proliferation. 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 4) restaging CT scan of the chest on September 14, 2019 showed new 1.5 cm soft tissue lesion along the right subpulmonic diaphragmatic pleural surface worrisome for tumor recurrence.  She was followed by observation by Dr. Angelina Ok at [place] [facility] 5) SBRT to the right lower lobe/diaphragmatic lesion completed June 22, 2020. 6) restaging scan on November 30, 2020 showed new right posterior pleural-based nodule followed by observation.  But repeat CT scan of the chest on 02/07/2021 showed enlarging right pleural mass/nodules. 7) the patient is started treatment with pazopanib on 02/17/2021 400 mg titrated to 800 mg p.o. daily but her treatment was held between July 8 to March 05, 2021 secondary to fatigue and arthralgia.  She resumed her treatment at a dose of 400 mg p.o. daily for 3 days titrated to 600 mg p.o. daily but this was again held in mid August 2022.  She resumed her treatment on 04/23/2021 at 400 mg p.o. daily but this was discontinued in October 2022 secondary to disease progression was a scan on 05/30/2021 revealing progressive multifocal pleural-based disease.  CURRENT THERAPY:  Temodar 150 Mg/M2 daily for days 1-7 and 15-21  every 4 weeks in addition to a Avastin 5 Mg/KG on days 8 and 22 every 4 weeks.  Started October 15, 2021.  Starting from cycle #3 her dose of Temodar was reduced to 140 mg once daily on days 1-7 and 15-21 every 4 weeks.  Status post 4 cycles of treatment. Starting from cycle #4, the patient opted to undergo temodar for days 1-7 and avastin on day 8 once every 4 weeks starting on 03/25/22.   INTERVAL HISTORY: Brenda Patel 61 y.o. female returns to the clinic today for a follow-up visit.  The patient was last seen in the clinic on 03/25/22 by Dr. Julien Nordmann.  Since last being seen, the patient saw Dr. Angelina Ok at [place] [facility] and had repeat imaging on 04/03/2022 which showed mild interval decrease in the anterior right pleural-based nodule with the remaining right pleural-based nodules remaining stable.  Of note, the patient is scheduled for her next restaging CT scan on 07/03/22. The patient is having some bruising from her treatment and she is wondering if she can have an alternative dose scheduling for her Avastin every 3 to 4 weeks.  It appears this was discussed with Dr. Angelina Ok as well as Dr. Julien Nordmann who discussed that alternative dosing schedule has a potential risk to lose disease control and we may lose the opportunity to regain disease control. The patient stated today that "I am willing to take that risk". Additionally, the patient previously had an allergy to the Port-A-Cath which ultimately had to be removed.  The patient is wondering if this can be retrialed and if there are other manufacturers Port-A-Cath. She has several questions  about port-a-caths today. She is not interested in a PICC because she does not tolerate dressings due to skin sensitivity. She also does not like having tubes coming out of her body.   Overall the patient is feeling fairly well today.  She denies any fever, chills, night sweats, or unexplained weight loss. She denies abnormal bleeding or bruising. She denies any fever,  chills, night sweats, or unexplained weight loss.  Denies any chest pain, shortness of breath, cough, or hemoptysis.  Denies any abdominal pain.  She is here today for evaluation.     MEDICAL HISTORY: Past Medical History:  Diagnosis Date   Asthma    Cancer ([place])    Depression    Dyspnea    Pleural effusion on right    Pleural mass     ALLERGIES:  is allergic to cyanoacrylate, other, wound dressing adhesive, codeine, and lentil.  MEDICATIONS:  Current Outpatient Medications  Medication Sig Dispense Refill   Ascorbic Acid (VITAMIN C) 100 MG tablet Take 100 mg by mouth every other day.     Calcium Citrate 333 MG TABS Take 333 mg by mouth daily.     [employer] 12 HOUR NASAL DECONGESTANT 120 MG 12 hr tablet Take 120 mg by mouth daily.  10   fluticasone (FLONASE) 50 MCG/ACT nasal spray Place 1 spray into both nostrils daily as needed for allergies.  5   meloxicam (MOBIC) 15 MG tablet Take 15 mg by mouth daily as needed for pain.      montelukast (SINGULAIR) 10 MG tablet Take 10 mg by mouth daily after supper.  10   Multiple Vitamins-Iron (MULTIVITAMINS WITH IRON) TABS tablet Take 1 tablet by mouth every other day. 65 mg     ondansetron (ZOFRAN) 8 MG tablet Take 1 tablet (8 mg total) by mouth every 8 (eight) hours as needed for nausea or vomiting. 30 tablet 2   temozolomide (TEMODAR) 140 MG capsule Take 1 capsule (140 mg total) by mouth daily. Take on days 1-7 and days 15-21 of each 28 day cycle. May take on an empty stomach or at bedtime to decrease nausea & vomiting. 14 capsule 3   No current facility-administered medications for this visit.    SURGICAL HISTORY:  Past Surgical History:  Procedure Laterality Date   CHEST TUBE INSERTION Right 07/16/2018   Procedure: INSERTION PLEURAL DRAINAGE CATHETER, right;  Surgeon: Grace Isaac, MD;  Location: [facility];  Service: Thoracic;  Laterality: Right;   COLONOSCOPY  2017   GUM SURGERY  1984   GRAFTS   IR IMAGING GUIDED PORT INSERTION   10/02/2021   IR RADIOLOGIST EVAL & MGMT  10/08/2021   IR RADIOLOGIST EVAL & MGMT  10/11/2021   IR REMOVAL TUN ACCESS W/ PORT W/O FL MOD SED  10/15/2021   IR THORACENTESIS ASP PLEURAL SPACE W/IMG GUIDE  07/03/2018    REVIEW OF SYSTEMS:   Review of Systems  Constitutional: Negative for appetite change, chills, fatigue, fever and unexpected weight change.  HENT:   Negative for mouth sores, nosebleeds, sore throat and trouble swallowing.   Eyes: Negative for eye problems and icterus.  Respiratory: Negative for cough, hemoptysis, shortness of breath and wheezing.   Cardiovascular: Negative for chest pain and leg swelling.  Gastrointestinal: Negative for abdominal pain, constipation, diarrhea, nausea and vomiting.  Genitourinary: Negative for bladder incontinence, difficulty urinating, dysuria, frequency and hematuria.   Musculoskeletal: Negative for back pain, gait problem, neck pain and neck stiffness.  Skin: Negative for itching and  rash.  Neurological: Negative for dizziness, extremity weakness, gait problem, headaches, light-headedness and seizures.  Hematological: Negative for adenopathy. Does not bruise/bleed easily.  Psychiatric/Behavioral: Negative for confusion, depression and sleep disturbance. The patient is not nervous/anxious.     PHYSICAL EXAMINATION:  Blood pressure 139/75, pulse 100, temperature 98.1 F (36.7 C), temperature source Oral, resp. rate 14, height 5\' 6"  (1.676 m), weight 155 lb 9.6 oz (70.6 kg), SpO2 100 %.  ECOG PERFORMANCE STATUS: 1  Physical Exam  Constitutional: Oriented to person, place, and time and well-developed, well-nourished, and in no distress.  HENT:  Head: Normocephalic and atraumatic.  Mouth/Throat: Oropharynx is clear and moist. No oropharyngeal exudate.  Eyes: Conjunctivae are normal. Right eye exhibits no discharge. Left eye exhibits no discharge. No scleral icterus.  Neck: Normal range of motion. Neck supple.  Cardiovascular: Normal rate,  regular rhythm, normal heart sounds and intact distal pulses.   Pulmonary/Chest: Effort normal and breath sounds normal. No respiratory distress. No wheezes. No rales.  Abdominal: Soft. Bowel sounds are normal. Exhibits no distension and no mass. There is no tenderness.  Musculoskeletal: Normal range of motion. Exhibits no edema.  Lymphadenopathy:    No cervical adenopathy.  Neurological: Alert and oriented to person, place, and time. Exhibits normal muscle tone. Gait normal. Coordination normal.  Skin: Positive for bruise on right forearm. Skin is warm and dry. No rash noted. Not diaphoretic. No erythema. No pallor.  Psychiatric: Mood, memory and judgment normal.  Vitals reviewed.  LABORATORY DATA: Lab Results  Component Value Date   WBC 3.2 (L) 04/01/2022   HGB 12.1 04/01/2022   HCT 36.4 04/01/2022   MCV 97.8 04/01/2022   PLT 117 (L) 04/01/2022      Chemistry      Component Value Date/Time   NA 139 04/01/2022 0938   K 4.0 04/01/2022 0938   CL 104 04/01/2022 0938   CO2 29 04/01/2022 0938   BUN 14 04/01/2022 0938   CREATININE 0.79 04/01/2022 0938      Component Value Date/Time   CALCIUM 9.5 04/01/2022 0938   ALKPHOS 79 04/01/2022 0938   AST 28 04/01/2022 0938   ALT 20 04/01/2022 0938   BILITOT 0.4 04/01/2022 0938       RADIOGRAPHIC STUDIES:  No results found.   ASSESSMENT/PLAN:  This is a very pleasant 61 years old Caucasian female presented with large right lower lobe lung mass in addition to right pleural effusion.  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 The patient was referred to Dr. Angelina Ok at [place] [facility] for second opinion and she underwent several studies and intervention as listed below   1) repeat biopsy on August 11, 2018 of the lung mass at [facility] and it showed atypical spindle cell proliferation. 2) on September 23, 2018 she underwent bilateral transexternal thoracotomies for  resection of the right pleural-based tumor with en bloc right lower lobe wedge resection and the pathology revealed 18 cm solitary fibrous tumor with negative margins. 3) restaging CT scan of the chest on September 14, 2019 showed new 1.5 cm soft tissue lesion along the right subpulmonic diaphragmatic pleural surface worrisome for tumor recurrence.  She was followed by observation by Dr. Angelina Ok at [place] [facility] 4) SBRT to the right lower lobe/diaphragmatic lesion completed June 22, 2020. 5) restaging scan on November 30, 2020 showed new right posterior pleural-based nodule followed by observation.  But repeat CT scan of the chest on 02/07/2021  showed enlarging right pleural mass/nodules. 6) the patient is started treatment with pazopanib on 02/17/2021 400 mg titrated to 800 mg p.o. daily but her treatment was held between July 8 to March 05, 2021 secondary to fatigue and arthralgia.  She resumed her treatment at a dose of 400 mg p.o. daily for 3 days titrated to 600 mg p.o. daily but this was again held in mid August 2022.  She resumed her treatment on 04/23/2021 at 400 mg p.o. daily but this was discontinued in October 2022 secondary to disease progression was a scan on 05/30/2021 revealing progressive multifocal pleural-based disease.. There was also a scan on 09/20/21 which showed progressive multifocal pleural based disease.   Dr. Angelina Ok recommended she consider treatment with Avastin and Temodar. She requested delaying starting treatment until after the holidays. She is on Avastin 5 mg/kg milligrams on days 8 and 22 every 4 weeks with Temodar p.o on days 1-7 and 15-21 every 4 weeks. She is status post 4 cycles.  Her Temodar has been on hold for several months due to significant thrombocytopenia requiring numerous platelet transfusions.  In June 2023, her lab work improved.  Her Temodar was resumed at a 50% dose reduction.  The patient is currently taking 140 milligrams.  Thus far, she has been  tolerating this well.  The patient recently had a restaging CT scan performed at [facility] on 04/03/2022 which showed stable disease with slight decrease in an anterior right pleural-based nodule.  The patient was seen with Dr. Julien Nordmann.  The patient is wondering if she could go on an alternative dose every 3 or every 4 weeks of Avastin.  Having frequent lab draws and treatment is causing significant bruising which is distressing to the patient.  Dr. Julien Nordmann discussed we do not have data to support this and he would strongly encouraged the patient to continue Avastin at the every 2-week dosing schedule.  The patient understands the risk and stated that she "is willing to take the risk".  Therefore, we will change the Avastin dose to once every 4 weeks.  She also stated that she would like to take her Temodar on days 1 through 7 every 4 weeks.  She was counseled on the potential risk of loss of disease control, as well as the possible risk of not being able to regain control with return to her current regimen.   Last dose of treatment was on 01/30/2022.  She would like to start the every 4-week dosing on 04/30/2022.  We will adjust her appointments moving forward.  We will see her back for follow-up visit on 04/30/2022 before starting cycle #5 of treatment.  Patient has several questions today regarding a Port-A-Cath placement.  Patient has significant allergy to the Port-A-Cath itself a few months ago which ultimately had to be removed.  We did reach out to IR who mentioned they have a Clear view Polyurothane port that they can try. Of course, the patient understands that we are unable to predict if she will have an allergy to an alternative device. She has several questions about sizing options for ports and placement. We offered direct consultation with IR who can discuss this in more depth with her. She is not interested in a PICC.   The patient is scheduled for repeat scan of the chest on 07/03/2022 with Dr.  Angelina Ok.  The patient was advised to call immediately if she has any concerning symptoms in the interval. The patient voices understanding of current disease status and  treatment options and is in agreement with the current care plan. All questions were answered. The patient knows to call the clinic with any problems, questions or concerns. We can certainly see the patient much sooner if necessary    Orders Placed This Encounter  Procedures   IR Radiologist Eval & Mgmt    She previously had allergy to the port itself. However, she is wondering what option port options with different manufactures are. She also has some questions about positioning of ports. Please see her for consultation to discuss her questions one on one to see if then you would recommend trying to place another port-a-cath    Standing Status:   Future    Standing Expiration Date:   04/09/2023    Order Specific Question:   Reason for Exam (SYMPTOM  OR DIAGNOSIS REQUIRED)    Answer:   Patient wants consultation to discuss port options. She previously had significant allergy to port. Wants consultation to review her options with someone in IR.    Order Specific Question:   Is the patient pregnant?    Answer:   No    Order Specific Question:   Preferred Imaging Location?    Answer:   [place], PA-C 04/08/22  ADDENDUM: Hematology/Oncology Attending: Face-to-face encounter with the patient today.  I reviewed her records, lab, scan results and recommended her care plan.  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  Status post several treatments including right Pleurx catheter placement with drainage of the recurrent right pleural effusion followed by repeat biopsy at [facility] followed by bilateral  thoracotomies and resection of the right pleural-based tumor with en bloc right lower lobe wedge resection and the pathology was consistent with solitary fibrous tumor with negative margins but she had evidence for disease recurrence in January 2021 status post several chemo regimens since that time including treatment with Brigatinib (Alungbrig) but this was discontinued secondary to disease progression.  The patient started treatment with Temodar on days 1-7 and 15-21 and Avastin 5 Mg/KG on days 8 and 22 post 4 cycles.  Her treatment was interrupted after cycle #2 because of significant pancytopenia and her dose of Temodar was reduced to 50% from cycle #3.  The patient was seen recently by Dr. Angelina Ok at [place] [facility] and repeat CT scan of the chest, abdomen and pelvis showed stable to slight improvement of her disease. The patient has some concern about the IV access and receiving Avastin every 2 weeks.  She would like to decrease the frequency of Avastin to every 4 weeks and also not to take Temodar except for 7 days every 4 weeks. I had a lengthy discussion with the patient today about her condition and the risk of disease progression with the reduced dose of Avastin and Temodar but she would like to take that risk.  We will also arrange for the patient to meet with interventional radiology for discussion of any alternative option for IV access and Port-A-Cath placement that may not cause her to have the same allergic reaction as the previous one. She was supposed to start the next dose of Avastin next week but she also would like to delay the start of her treatment until April 30, 2022. We will honor her request but the patient  has full understanding of the possibility of disease progression with these changes in her treatment is willing to take the risk. She will come back for follow-up visit on the first day of cycle #5 of her treatment. The patient was advised to call immediately if  she has any other concerning symptoms in the interval. The total time spent in the appointment was 40 minutes. Disclaimer: This note was dictated with voice recognition software. Similar sounding words can inadvertently be transcribed and may be missed upon review. Eilleen Kempf, MD

## 2022-04-08 ENCOUNTER — Inpatient Hospital Stay (HOSPITAL_BASED_OUTPATIENT_CLINIC_OR_DEPARTMENT_OTHER): Payer: BC Managed Care – PPO | Admitting: Physician Assistant

## 2022-04-08 ENCOUNTER — Encounter: Payer: Self-pay | Admitting: Physician Assistant

## 2022-04-08 ENCOUNTER — Other Ambulatory Visit: Payer: Self-pay

## 2022-04-08 ENCOUNTER — Other Ambulatory Visit: Payer: BC Managed Care – PPO

## 2022-04-08 ENCOUNTER — Telehealth: Payer: Self-pay | Admitting: Medical Oncology

## 2022-04-08 VITALS — BP 139/75 | HR 100 | Temp 98.1°F | Resp 14 | Ht 66.0 in | Wt 155.6 lb

## 2022-04-08 DIAGNOSIS — I878 Other specified disorders of veins: Secondary | ICD-10-CM | POA: Diagnosis not present

## 2022-04-08 DIAGNOSIS — C3491 Malignant neoplasm of unspecified part of right bronchus or lung: Secondary | ICD-10-CM | POA: Diagnosis not present

## 2022-04-08 DIAGNOSIS — Z95828 Presence of other vascular implants and grafts: Secondary | ICD-10-CM | POA: Diagnosis not present

## 2022-04-08 DIAGNOSIS — C3431 Malignant neoplasm of lower lobe, right bronchus or lung: Secondary | ICD-10-CM | POA: Diagnosis not present

## 2022-04-08 DIAGNOSIS — D492 Neoplasm of unspecified behavior of bone, soft tissue, and skin: Secondary | ICD-10-CM

## 2022-04-08 NOTE — Telephone Encounter (Signed)
Brenda Patel stated they have a "clear view polyurethane port a cath".  Cassie notified.

## 2022-04-15 ENCOUNTER — Other Ambulatory Visit: Payer: BC Managed Care – PPO

## 2022-04-15 ENCOUNTER — Ambulatory Visit: Payer: BC Managed Care – PPO

## 2022-04-17 ENCOUNTER — Ambulatory Visit
Admission: RE | Admit: 2022-04-17 | Discharge: 2022-04-17 | Disposition: A | Discharge status: Home / Self Care | Payer: BC Managed Care – PPO | Source: Ambulatory Visit | Attending: Physician Assistant | Admitting: Physician Assistant

## 2022-04-17 DIAGNOSIS — I878 Other specified disorders of veins: Secondary | ICD-10-CM

## 2022-04-17 DIAGNOSIS — Z95828 Presence of other vascular implants and grafts: Secondary | ICD-10-CM

## 2022-04-17 HISTORY — PX: IR RADIOLOGIST EVAL & MGMT: IMG5224

## 2022-04-17 NOTE — Progress Notes (Signed)
Referring Physician(s): Heilingoetter,Cassandra L  Chief Complaint: The patient is seen in follow up today s/p left chest port removal.  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   Brenda Patel underwent left chest wall Port-A-Cath placement on 10/02/2021 at [facility]. Within 24-48 hours patient began to notice some erythema and itching at port site. This progressed over several days despite use of oral Benadryl, topical cortisone cream, Tylenol, ice application and changing/removal of bandages. Brenda Patel denied fever or pain at the site. Brenda Patel contacted the IR department on 02/11 and was encouraged to use ice, Benadryl and ibuprofen as needed. Prescription for doxycycline 100 mg b.i.d. for 7 days was electronically sent to patient's pharmacy. Brenda Patel presented to IR department again on 02/13 for further evaluation. Brenda Patel denied fever or drainage from port site. Patient was seen by Dr. Serafina Royals and prescribed regimen of Medrol Dosepak. Doxycycline was discontinued.  Brenda Patel presented again on 10/11/2021 for follow-up exam. Brenda Patel continued to have itching and erythema in region although lighter in intensity since prior exam.   Despite additional treatment for infection and allergic reaction, her rash persisted and the port was eventually removed on 10/15/2021.  Brenda Patel returns today to clinic for consideration for placement of a new port.  Brenda Patel states that her rash improved very quickly after port removal.  Brenda Patel does not have a residual rash and her incision is well healed.  Past Medical History:  Diagnosis Date   Asthma    Cancer ([place])    Depression    Dyspnea    Pleural effusion on right    Pleural mass     Past Surgical History:  Procedure Laterality Date   CHEST TUBE INSERTION Right 07/16/2018   Procedure: INSERTION PLEURAL DRAINAGE CATHETER, right;  Surgeon: Grace Isaac, MD;  Location: [place];  Service: Thoracic;  Laterality: Right;   COLONOSCOPY  2017   GUM SURGERY  1984   GRAFTS   IR IMAGING GUIDED PORT INSERTION  10/02/2021   IR RADIOLOGIST EVAL & MGMT  10/08/2021   IR RADIOLOGIST EVAL & MGMT  10/11/2021   IR REMOVAL TUN ACCESS W/ PORT W/O FL MOD SED  10/15/2021   IR THORACENTESIS ASP PLEURAL SPACE W/IMG GUIDE  07/03/2018    Allergies: Cyanoacrylate, Other, Wound dressing adhesive, Codeine, and Lentil  Medications: Prior to Admission medications   Medication Sig Start Date End Date Taking? Authorizing Provider  Ascorbic Acid (VITAMIN C) 100 MG tablet Take 100 mg by mouth every other day.    [provider]  Calcium Citrate 333 MG TABS Take 333 mg by mouth daily.    [provider]  [employer] 12 HOUR NASAL DECONGESTANT 120 MG 12 hr tablet Take 120 mg by mouth daily. 05/31/18   [provider]  fluticasone (FLONASE) 50 MCG/ACT nasal spray Place 1 spray into both nostrils daily as needed for allergies. 05/05/18   [provider]  meloxicam (MOBIC) 15 MG tablet Take 15 mg by mouth daily as needed for pain.  10/13/19   [provider]  montelukast (SINGULAIR) 10 MG tablet Take 10 mg by mouth daily after supper. 06/07/18   [provider]  Multiple Vitamins-Iron (MULTIVITAMINS WITH IRON) TABS tablet Take 1 tablet by mouth every other day. 65 mg    [provider]  ondansetron (ZOFRAN) 8 MG tablet Take 1 tablet (8 mg total) by mouth  every 8 (eight) hours as needed for nausea or vomiting. 03/11/22   Heilingoetter, Cassandra L, PA-C  temozolomide (TEMODAR) 140 MG capsule Take 1 capsule (140 mg total) by mouth daily. Take on days 1-7 and days 15-21 of each 28 day cycle. May take on an empty stomach or at bedtime to decrease nausea & vomiting. 02/25/22   Curt Bears, MD     No family history on file.  Social History   Socioeconomic History   Marital status: Divorced    Spouse name: Not on file   Number of  children: Not on file   Years of education: Not on file   Highest education level: Not on file  Occupational History   Not on file  Tobacco Use   Smoking status: Never   Smokeless tobacco: Never  Vaping Use   Vaping Use: Never used  Substance and Sexual Activity   Alcohol use: Yes    Comment: 3-4 glasses of wine per month/liquor 1x monthly   Drug use: Never   Sexual activity: Not on file  Other Topics Concern   Not on file  Social History Narrative   Not on file   Social Determinants of Health   Financial Resource Strain: Not on file  Food Insecurity: Not on file  Transportation Needs: Not on file  Physical Activity: Unknown (07/15/2018)   Exercise Vital Sign    Days of Exercise per Week: 5 days    Minutes of Exercise per Session: Not on file  Stress: Not on file  Social Connections: Not on file   Vital Signs: There were no vitals taken for this visit.  Physical Exam Constitutional:      General: Brenda Patel is not in acute distress.    Appearance: Normal appearance. Brenda Patel is normal weight.  Pulmonary:     Effort: Pulmonary effort is normal.     Breath sounds: Normal breath sounds.  Skin:    General: Skin is warm and dry.     Comments: Left chest wall incision is well healed.  No rash.  Neurological:     Mental Status: Brenda Patel is alert and oriented to person, place, and time.     Imaging: No results found.  Labs:  CBC: Recent Labs    03/12/22 1321 03/18/22 1257 03/25/22 1045 04/01/22 0938  WBC 5.0 4.3 4.1 3.2*  HGB 10.9* 11.1* 11.7* 12.1  HCT 33.4* 33.9* 36.0 36.4  PLT 138* 126* 119* 117*    COAGS: Recent Labs    12/14/21 1101 12/19/21 1704  INR 0.9 1.0  APTT 24  --     BMP: Recent Labs    03/12/22 1321 03/18/22 1257 03/25/22 1045 04/01/22 0938  NA 139 140 139 139  K 3.8 4.1 3.8 4.0  CL 103 105 105 104  CO2 29 29 30 29   GLUCOSE 103* 113* 93 97  BUN 15 13 13 14   CALCIUM 10.0 9.9 9.5 9.5  CREATININE 0.75 0.74 0.68 0.79  GFRNONAA >60 >60 >60  >60    LIVER FUNCTION TESTS: Recent Labs    03/12/22 1321 03/18/22 1257 03/25/22 1045 04/01/22 0938  BILITOT 0.4 0.4 0.5 0.4  AST 25 26 26 28   ALT 16 17 19 20   ALKPHOS 73 74 75 79  PROT 7.5 7.3 7.7 7.7  ALBUMIN 4.3 4.3 4.5 4.5    Assessment and Plan:  61 year old woman with history of 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  Despite treatment for infection and allergic reaction, her rash persisted.  The rash did resolve soon after removing the port on 10/15/2021.  Brenda Patel returns to clinic today as Brenda Patel is considering a new port, but is concerned that Brenda Patel may have a recurrent allergic reaction.  Brenda Patel also interested in a lower profile port, if possible.  I spoke with her about possible options of ClearVUE Bard or Angiodynamic Ports.     I believe the best option for her will be the [facility] from [place] as this will be lower profile and made of Silicone.  Her reaction to the prior port may have been related to the metal and this port will have minimal metal.  Brenda Patel will make a decision on getting a new port in November, 2023 after her next  scan.  Brenda Patel will contact us if Brenda Patel does choose to have a port placed.  We will again discuss which option is the best at that time prior to proceeding.  Electronically Signed: Paula Libra Mikya Don 04/17/2022, 3:42 PM   I spent a total of 15 Minutes in face to face in clinical consultation, greater than 50% of which was counseling/coordinating care for [facility] [place].

## 2022-04-18 ENCOUNTER — Telehealth: Payer: Self-pay | Admitting: Internal Medicine

## 2022-04-18 NOTE — Telephone Encounter (Signed)
Rescheduled 09/05 appointment due to providers request, called patient and notified her.

## 2022-04-22 ENCOUNTER — Ambulatory Visit: Payer: BC Managed Care – PPO | Admitting: Internal Medicine

## 2022-04-22 ENCOUNTER — Other Ambulatory Visit: Payer: BC Managed Care – PPO

## 2022-04-24 ENCOUNTER — Other Ambulatory Visit (HOSPITAL_COMMUNITY): Payer: Self-pay

## 2022-04-25 ENCOUNTER — Other Ambulatory Visit (HOSPITAL_COMMUNITY): Payer: Self-pay

## 2022-04-26 ENCOUNTER — Other Ambulatory Visit: Payer: Self-pay

## 2022-04-30 ENCOUNTER — Other Ambulatory Visit: Payer: Self-pay | Admitting: Internal Medicine

## 2022-04-30 ENCOUNTER — Inpatient Hospital Stay: Payer: BC Managed Care – PPO | Attending: Internal Medicine | Admitting: Internal Medicine

## 2022-04-30 ENCOUNTER — Other Ambulatory Visit: Payer: BC Managed Care – PPO

## 2022-04-30 ENCOUNTER — Inpatient Hospital Stay: Payer: BC Managed Care – PPO

## 2022-04-30 ENCOUNTER — Ambulatory Visit: Payer: BC Managed Care – PPO

## 2022-04-30 VITALS — BP 142/83 | HR 96 | Temp 98.2°F | Resp 15 | Wt 156.3 lb

## 2022-04-30 VITALS — BP 139/72 | HR 91 | Temp 97.9°F | Resp 16

## 2022-04-30 DIAGNOSIS — C3491 Malignant neoplasm of unspecified part of right bronchus or lung: Secondary | ICD-10-CM | POA: Diagnosis not present

## 2022-04-30 DIAGNOSIS — D61818 Other pancytopenia: Secondary | ICD-10-CM | POA: Diagnosis not present

## 2022-04-30 DIAGNOSIS — Z5112 Encounter for antineoplastic immunotherapy: Secondary | ICD-10-CM | POA: Insufficient documentation

## 2022-04-30 DIAGNOSIS — Z885 Allergy status to narcotic agent status: Secondary | ICD-10-CM | POA: Insufficient documentation

## 2022-04-30 DIAGNOSIS — Z79630 Long term (current) use of alkylating agent: Secondary | ICD-10-CM | POA: Insufficient documentation

## 2022-04-30 DIAGNOSIS — R5383 Other fatigue: Secondary | ICD-10-CM | POA: Diagnosis not present

## 2022-04-30 DIAGNOSIS — D6959 Other secondary thrombocytopenia: Secondary | ICD-10-CM | POA: Diagnosis not present

## 2022-04-30 DIAGNOSIS — D492 Neoplasm of unspecified behavior of bone, soft tissue, and skin: Secondary | ICD-10-CM

## 2022-04-30 DIAGNOSIS — M2550 Pain in unspecified joint: Secondary | ICD-10-CM | POA: Insufficient documentation

## 2022-04-30 DIAGNOSIS — Z5111 Encounter for antineoplastic chemotherapy: Secondary | ICD-10-CM | POA: Diagnosis not present

## 2022-04-30 DIAGNOSIS — C3431 Malignant neoplasm of lower lobe, right bronchus or lung: Secondary | ICD-10-CM | POA: Diagnosis present

## 2022-04-30 DIAGNOSIS — J90 Pleural effusion, not elsewhere classified: Secondary | ICD-10-CM | POA: Insufficient documentation

## 2022-04-30 DIAGNOSIS — R531 Weakness: Secondary | ICD-10-CM | POA: Insufficient documentation

## 2022-04-30 DIAGNOSIS — Z79899 Other long term (current) drug therapy: Secondary | ICD-10-CM | POA: Insufficient documentation

## 2022-04-30 DIAGNOSIS — J45909 Unspecified asthma, uncomplicated: Secondary | ICD-10-CM | POA: Diagnosis not present

## 2022-04-30 LAB — CBC WITH DIFFERENTIAL (CANCER CENTER ONLY)
Abs Immature Granulocytes: 0.01 10*3/uL (ref 0.00–0.07)
Basophils Absolute: 0 10*3/uL (ref 0.0–0.1)
Basophils Relative: 1 %
Eosinophils Absolute: 0.1 10*3/uL (ref 0.0–0.5)
Eosinophils Relative: 2 %
HCT: 36.5 % (ref 36.0–46.0)
Hemoglobin: 12.2 g/dL (ref 12.0–15.0)
Immature Granulocytes: 0 %
Lymphocytes Relative: 18 %
Lymphs Abs: 0.9 10*3/uL (ref 0.7–4.0)
MCH: 32.4 pg (ref 26.0–34.0)
MCHC: 33.4 g/dL (ref 30.0–36.0)
MCV: 97.1 fL (ref 80.0–100.0)
Monocytes Absolute: 0.5 10*3/uL (ref 0.1–1.0)
Monocytes Relative: 10 %
Neutro Abs: 3.4 10*3/uL (ref 1.7–7.7)
Neutrophils Relative %: 69 %
Platelet Count: 144 10*3/uL — ABNORMAL LOW (ref 150–400)
RBC: 3.76 MIL/uL — ABNORMAL LOW (ref 3.87–5.11)
RDW: 13.2 % (ref 11.5–15.5)
WBC Count: 4.9 10*3/uL (ref 4.0–10.5)
nRBC: 0 % (ref 0.0–0.2)

## 2022-04-30 LAB — CMP (CANCER CENTER ONLY)
ALT: 16 U/L (ref 0–44)
AST: 25 U/L (ref 15–41)
Albumin: 4.7 g/dL (ref 3.5–5.0)
Alkaline Phosphatase: 88 U/L (ref 38–126)
Anion gap: 6 (ref 5–15)
BUN: 17 mg/dL (ref 6–20)
CO2: 31 mmol/L (ref 22–32)
Calcium: 10.2 mg/dL (ref 8.9–10.3)
Chloride: 101 mmol/L (ref 98–111)
Creatinine: 0.86 mg/dL (ref 0.44–1.00)
GFR, Estimated: >60 mL/min (ref >60)
Glucose, Bld: 89 mg/dL (ref 70–99)
Potassium: 3.7 mmol/L (ref 3.5–5.1)
Sodium: 138 mmol/L (ref 135–145)
Total Bilirubin: 0.3 mg/dL (ref 0.3–1.2)
Total Protein: 7.8 g/dL (ref 6.5–8.1)

## 2022-04-30 LAB — TOTAL PROTEIN, URINE DIPSTICK: Protein, ur: NEGATIVE mg/dL

## 2022-04-30 MED ORDER — SODIUM CHLORIDE 0.9 % IV SOLN
5.0000 mg/kg | Freq: Once | INTRAVENOUS | Status: AC
  Administered 2022-04-30: 350 mg via INTRAVENOUS
  Filled 2022-04-30: qty 14

## 2022-04-30 MED ORDER — SODIUM CHLORIDE 0.9 % IV SOLN: Freq: Once | INTRAVENOUS | Status: AC

## 2022-04-30 NOTE — Patient Instructions (Signed)
[facility] ONCOLOGY  Discharge Instructions: Thank you for choosing [facility] to provide your oncology and hematology care.   If you have a lab appointment with the [facility], please go directly to the [facility] and check in at the registration area.   Wear comfortable clothing and clothing appropriate for easy access to any Portacath or PICC line.   We strive to give you quality time with your provider. You may need to reschedule your appointment if you arrive late (15 or more minutes).  Arriving late affects you and other patients whose appointments are after yours.  Also, if you miss three or more appointments without notifying the office, you may be dismissed from the clinic at the provider's discretion.      For prescription refill requests, have your pharmacy contact our office and allow 72 hours for refills to be completed.    Today you received the following chemotherapy and/or immunotherapy agents: bevacizumab      To help prevent nausea and vomiting after your treatment, we encourage you to take your nausea medication as directed.  BELOW ARE SYMPTOMS THAT SHOULD BE REPORTED IMMEDIATELY: *FEVER GREATER THAN 100.4 F (38 C) OR HIGHER *CHILLS OR SWEATING *NAUSEA AND VOMITING THAT IS NOT CONTROLLED WITH YOUR NAUSEA MEDICATION *UNUSUAL SHORTNESS OF BREATH *UNUSUAL BRUISING OR BLEEDING *URINARY PROBLEMS (pain or burning when urinating, or frequent urination) *BOWEL PROBLEMS (unusual diarrhea, constipation, pain near the anus) TENDERNESS IN MOUTH AND THROAT WITH OR WITHOUT PRESENCE OF ULCERS (sore throat, sores in mouth, or a toothache) UNUSUAL RASH, SWELLING OR PAIN  UNUSUAL VAGINAL DISCHARGE OR ITCHING   Items with * indicate a potential emergency and should be followed up as soon as possible or go to the Emergency Department if any problems should occur.  Please show the CHEMOTHERAPY ALERT CARD or IMMUNOTHERAPY ALERT CARD at check-in  to the Emergency Department and triage nurse.  Should you have questions after your visit or need to cancel or reschedule your appointment, please contact [facility]  Dept: [phone]  and follow the prompts.  Office hours are 8:00 a.m. to 4:30 p.m. Monday - Friday. Please note that voicemails left after 4:00 p.m. may not be returned until the following business day.  We are closed weekends and major holidays. You have access to a nurse at all times for urgent questions. Please call the main number to the clinic Dept: [phone] and follow the prompts.   For any non-urgent questions, you may also contact your provider using MyChart. We now offer e-Visits for anyone 49 and older to request care online for non-urgent symptoms. For details visit [email or web address].   Also download the MyChart app! Go to the app store, search "MyChart", open the app, select [facility], and log in with your MyChart username and password.  Masks are optional in the cancer centers. If you would like for your care team to wear a mask while they are taking care of you, please let them know. For doctor visits, patients may have with them one support person who is at least 59 years old. At this time, visitors are not allowed in the infusion area. Bevacizumab injection What is this medication? BEVACIZUMAB (be va SIZ yoo mab) is a monoclonal antibody. It is used to treat many types of cancer. This medicine may be used for other purposes; ask your health care provider or pharmacist if you have questions. COMMON BRAND NAME(S): Alymsys, Avastin, MVASI, Noah Charon  What should I tell my care team before I take this medication? They need to know if you have any of these conditions: diabetes heart disease high blood pressure history of coughing up blood prior anthracycline chemotherapy (e.g., doxorubicin, daunorubicin, epirubicin) recent or ongoing radiation therapy recent or planning to have  surgery stroke an unusual or allergic reaction to bevacizumab, hamster proteins, mouse proteins, other medicines, foods, dyes, or preservatives pregnant or trying to get pregnant breast-feeding How should I use this medication? This medicine is for infusion into a vein. It is given by a health care professional in a hospital or clinic setting. Talk to your pediatrician regarding the use of this medicine in children. Special care may be needed. Overdosage: If you think you have taken too much of this medicine contact a poison control center or emergency room at once. NOTE: This medicine is only for you. Do not share this medicine with others. What if I miss a dose? It is important not to miss your dose. Call your doctor or health care professional if you are unable to keep an appointment. What may interact with this medication? Interactions are not expected. This list may not describe all possible interactions. Give your health care provider a list of all the medicines, herbs, non-prescription drugs, or dietary supplements you use. Also tell them if you smoke, drink alcohol, or use illegal drugs. Some items may interact with your medicine. What should I watch for while using this medication? Your condition will be monitored carefully while you are receiving this medicine. You will need important blood work and urine testing done while you are taking this medicine. This medicine may increase your risk to bruise or bleed. Call your doctor or health care professional if you notice any unusual bleeding. Before having surgery, talk to your health care provider to make sure it is ok. This drug can increase the risk of poor healing of your surgical site or wound. You will need to stop this drug for 28 days before surgery. After surgery, wait at least 28 days before restarting this drug. Make sure the surgical site or wound is healed enough before restarting this drug. Talk to your health care provider if  questions. Do not become pregnant while taking this medicine or for 6 months after stopping it. Women should inform their doctor if they wish to become pregnant or think they might be pregnant. There is a potential for serious side effects to an unborn child. Talk to your health care professional or pharmacist for more information. Do not breast-feed an infant while taking this medicine and for 6 months after the last dose. This medicine has caused ovarian failure in some women. This medicine may interfere with the ability to have a child. You should talk to your doctor or health care professional if you are concerned about your fertility. What side effects may I notice from receiving this medication? Side effects that you should report to your doctor or health care professional as soon as possible: allergic reactions like skin rash, itching or hives, swelling of the face, lips, or tongue chest pain or chest tightness chills coughing up blood high fever seizures severe constipation signs and symptoms of bleeding such as bloody or black, tarry stools; red or dark-brown urine; spitting up blood or brown material that looks like coffee grounds; red spots on the skin; unusual bruising or bleeding from the eye, gums, or nose signs and symptoms of a blood clot such as breathing problems; chest pain;  severe, sudden headache; pain, swelling, warmth in the leg signs and symptoms of a stroke like changes in vision; confusion; trouble speaking or understanding; severe headaches; sudden numbness or weakness of the face, arm or leg; trouble walking; dizziness; loss of balance or coordination stomach pain sweating swelling of legs or ankles vomiting weight gain Side effects that usually do not require medical attention (report to your doctor or health care professional if they continue or are bothersome): back pain changes in taste decreased appetite dry skin nausea tiredness This list may not describe  all possible side effects. Call your doctor for medical advice about side effects. You may report side effects to FDA at 1-800-FDA-1088. Where should I keep my medication? This drug is given in a hospital or clinic and will not be stored at home. NOTE: This sheet is a summary. It may not cover all possible information. If you have questions about this medicine, talk to your doctor, pharmacist, or health care provider.  2023 Elsevier/Gold Standard (2021-07-13 00:00:00)

## 2022-04-30 NOTE — Progress Notes (Signed)
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  PRIOR THERAPY:  1) Status post right Pleurx catheter placement for drainage of recurrent right pleural effusion. 2) repeat biopsy on August 11, 2018 of the lung mass at [facility] and it showed atypical spindle cell proliferation. 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 4) restaging CT scan of the chest on September 14, 2019 showed new 1.5 cm soft tissue lesion along the right subpulmonic diaphragmatic pleural surface worrisome for tumor recurrence.  She was followed by observation by Dr. Angelina Ok at [facility] 5) SBRT to the right lower lobe/diaphragmatic lesion completed June 22, 2020. 6) restaging scan on November 30, 2020 showed new right posterior pleural-based nodule followed by observation.  But repeat CT scan of the chest on 02/07/2021 showed enlarging right pleural mass/nodules. 7) the patient is started treatment with pazopanib on 02/17/2021 400 mg titrated to 800 mg p.o. daily but her treatment was held between July 8 to March 05, 2021 secondary to fatigue and arthralgia.  She resumed her treatment at a dose of 400 mg p.o. daily for 3 days titrated to 600 mg p.o. daily but this was again held in mid August 2022.  She resumed her treatment on 04/23/2021 at 400 mg p.o. daily but this was discontinued in October 2022 secondary to disease progression was a scan on 05/30/2021 revealing progressive multifocal pleural-based disease.  CURRENT  THERAPY: Temodar 150 Mg/M2 daily for days 1-7 and 15-21 every 4 weeks in addition to a Avastin 5 Mg/KG on days 8 and 22 every 4 weeks.  Started October 15, 2021.  Starting from cycle #3 her dose of Temodar was reduced to 140 mg once daily on days 1-7 and 15-21 every 4 weeks.  Status post 4 cycles of treatment.  Starting from cycle #4 her treatment was reduced to Temodar 140 Mg on days 1-7 and Avastin 5 Mg/KG on day 8 every 4 weeks based on her request secondary to intolerance.  INTERVAL HISTORY: Brenda Patel 61 y.o. female returns to the clinic today for follow-up visit.  The patient is feeling much better today with less fatigue and weakness.  She denied having any current chest pain, shortness of breath, cough or hemoptysis.  She has no nausea, vomiting, diarrhea or constipation.  She has no headache or visual changes.  She denied having any recent weight loss or night sweats.  She has a discussion with interventional radiology about consideration of another Port-A-Cath placement in the future and they are looking for other options and alternative for the previous Port-A-Cath device.  The patient is here today for evaluation before starting day 8 of cycle #5.   MEDICAL HISTORY: Past Medical History:  Diagnosis Date   Asthma    Cancer ([place])    Depression    Dyspnea    Pleural effusion on right    Pleural mass     ALLERGIES:  is allergic to cyanoacrylate, other, wound dressing adhesive, codeine, and lentil.  MEDICATIONS:  Current Outpatient Medications  Medication Sig Dispense Refill  Ascorbic Acid (VITAMIN C) 100 MG tablet Take 100 mg by mouth every other day.     Calcium Citrate 333 MG TABS Take 333 mg by mouth daily.     [employer] 12 HOUR NASAL DECONGESTANT 120 MG 12 hr tablet Take 120 mg by mouth daily.  10   fluticasone (FLONASE) 50 MCG/ACT nasal spray Place 1 spray into both nostrils daily as needed for allergies.  5   meloxicam (MOBIC) 15 MG tablet Take 15 mg by mouth daily as needed  for pain.      montelukast (SINGULAIR) 10 MG tablet Take 10 mg by mouth daily after supper.  10   Multiple Vitamins-Iron (MULTIVITAMINS WITH IRON) TABS tablet Take 1 tablet by mouth every other day. 65 mg     ondansetron (ZOFRAN) 8 MG tablet Take 1 tablet (8 mg total) by mouth every 8 (eight) hours as needed for nausea or vomiting. 30 tablet 2   temozolomide (TEMODAR) 140 MG capsule Take 1 capsule (140 mg total) by mouth daily. Take on days 1-7 and days 15-21 of each 28 day cycle. May take on an empty stomach or at bedtime to decrease nausea & vomiting. 14 capsule 3   No current facility-administered medications for this visit.    SURGICAL HISTORY:  Past Surgical History:  Procedure Laterality Date   CHEST TUBE INSERTION Right 07/16/2018   Procedure: INSERTION PLEURAL DRAINAGE CATHETER, right;  Surgeon: Grace Isaac, MD;  Location: [facility];  Service: Thoracic;  Laterality: Right;   COLONOSCOPY  2017   GUM SURGERY  1984   GRAFTS   IR IMAGING GUIDED PORT INSERTION  10/02/2021   IR RADIOLOGIST EVAL & MGMT  10/08/2021   IR RADIOLOGIST EVAL & MGMT  10/11/2021   IR RADIOLOGIST EVAL & MGMT  04/17/2022   IR REMOVAL TUN ACCESS W/ PORT W/O FL MOD SED  10/15/2021   IR THORACENTESIS ASP PLEURAL SPACE W/IMG GUIDE  07/03/2018    REVIEW OF SYSTEMS:  A comprehensive review of systems was negative.   PHYSICAL EXAMINATION: General appearance: alert, cooperative, and no distress Head: Normocephalic, without obvious abnormality, atraumatic Neck: no adenopathy, no JVD, supple, symmetrical, trachea midline, and thyroid not enlarged, symmetric, no tenderness/mass/nodules Lymph nodes: Cervical, supraclavicular, and axillary nodes normal. Resp: clear to auscultation bilaterally Back: symmetric, no curvature. ROM normal. No CVA tenderness. Cardio: regular rate and rhythm, S1, S2 normal, no murmur, click, rub or gallop GI: soft, non-tender; bowel sounds normal; no masses,  no organomegaly Extremities:  extremities normal, atraumatic, no cyanosis or edema  ECOG PERFORMANCE STATUS: 1 - Symptomatic but completely ambulatory  Blood pressure (!) 142/83, pulse 96, temperature 98.2 F (36.8 C), temperature source Oral, resp. rate 15, weight 156 lb 4.8 oz (70.9 kg), SpO2 99 %.  LABORATORY DATA: Lab Results  Component Value Date   WBC 4.9 04/30/2022   HGB 12.2 04/30/2022   HCT 36.5 04/30/2022   MCV 97.1 04/30/2022   PLT 144 (L) 04/30/2022      Chemistry      Component Value Date/Time   NA 139 04/01/2022 0938   K 4.0 04/01/2022 0938   CL 104 04/01/2022 0938   CO2 29 04/01/2022 0938   BUN 14 04/01/2022 0938   CREATININE 0.79 04/01/2022 0938      Component Value Date/Time   CALCIUM 9.5 04/01/2022 0938   ALKPHOS 79 04/01/2022 0938   AST 28 04/01/2022 0938   ALT 20 04/01/2022 0938   BILITOT 0.4 04/01/2022 7824  RADIOGRAPHIC STUDIES: IR Radiologist Eval & Mgmt  Result Date: 04/17/2022 Please refer to notes tab for details about interventional procedure. (Op Note)    ASSESSMENT AND PLAN: This is a very pleasant 61 years old white female presented with large right lower lobe lung mass in addition to right pleural effusion.  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 The patient was referred to Dr. Angelina Ok at [facility] for second opinion and she underwent several studies and intervention as listed below 1) repeat biopsy on August 11, 2018 of the lung mass at [facility] and it showed atypical spindle cell proliferation. 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 3) restaging CT scan of the chest on September 14, 2019 showed new 1.5 cm soft tissue lesion along the right subpulmonic diaphragmatic pleural  surface worrisome for tumor recurrence.  She was followed by observation by Dr. Angelina Ok at [facility] 4) SBRT to the right lower lobe/diaphragmatic lesion completed June 22, 2020. 5) restaging scan on November 30, 2020 showed new right posterior pleural-based nodule followed by observation.  But repeat CT scan of the chest on 02/07/2021 showed enlarging right pleural mass/nodules. 6) the patient is started treatment with pazopanib on 02/17/2021 400 mg titrated to 800 mg p.o. daily but her treatment was held between July 8 to March 05, 2021 secondary to fatigue and arthralgia.  She resumed her treatment at a dose of 400 mg p.o. daily for 3 days titrated to 600 mg p.o. daily but this was again held in mid August 2022.  She resumed her treatment on 04/23/2021 at 400 mg p.o. daily but this was discontinued in October 2022 secondary to disease progression was a scan on 05/30/2021 revealing progressive multifocal pleural-based disease. Dr. Angelina Ok recommended for the patient treatment with Temodar and Avastin.  She started Temodar 150 Mg/M2 on days 1 and 7 as well as day 15-21 in addition to Avastin 5 Mg/KG on days 1 and 22 every 4 weeks.  She is status post 2 cycles.   Her treatment was complicated with significant pancytopenia especially thrombocytopenia secondary to the treatment with Temodar.  It took her several weeks to recover from this adverse effects. She resumed her treatment recently with reduced dose Temodar 140 mg p.o. on days 1-7 and 15-22 and she is tolerating it much better.  Status post 4 cycle with reduced regimen.  Starting from cycle #4 she is on treatment with Temodar 140 Mg on days 1-7 and Avastin 5 Mg/KG on day 8 every 4 weeks.  She tolerated this treatment much better with less adverse effects.  Her blood count is acceptable for treatment today. I recommended for the patient to proceed with day 8 of cycle #5 today as planned. She will come back for follow-up visit in 4 weeks for  evaluation on day 8 of cycle #6. The patient was advised to call immediately if she has any other concerning symptoms in the interval. The patient voices understanding of current disease status and treatment options and is in agreement with the current care plan.  All questions were answered. The patient knows to call the clinic with any problems, questions or concerns. We can certainly see the patient much sooner if necessary.  The total time spent in the appointment was  20 minutes.  Disclaimer: This note was dictated with voice recognition software. Similar sounding words can inadvertently be transcribed and may not be corrected upon review.

## 2022-05-02 ENCOUNTER — Other Ambulatory Visit: Payer: Self-pay

## 2022-05-06 ENCOUNTER — Other Ambulatory Visit: Payer: BC Managed Care – PPO

## 2022-05-06 ENCOUNTER — Ambulatory Visit: Payer: BC Managed Care – PPO | Admitting: Internal Medicine

## 2022-05-13 ENCOUNTER — Other Ambulatory Visit: Payer: BC Managed Care – PPO

## 2022-05-13 ENCOUNTER — Ambulatory Visit: Payer: BC Managed Care – PPO

## 2022-05-14 ENCOUNTER — Ambulatory Visit: Payer: BC Managed Care – PPO

## 2022-05-14 ENCOUNTER — Other Ambulatory Visit: Payer: BC Managed Care – PPO

## 2022-05-27 ENCOUNTER — Other Ambulatory Visit: Payer: Self-pay

## 2022-05-27 ENCOUNTER — Inpatient Hospital Stay: Payer: BC Managed Care – PPO

## 2022-05-27 ENCOUNTER — Inpatient Hospital Stay: Payer: BC Managed Care – PPO | Attending: Internal Medicine

## 2022-05-27 ENCOUNTER — Inpatient Hospital Stay (HOSPITAL_BASED_OUTPATIENT_CLINIC_OR_DEPARTMENT_OTHER): Payer: BC Managed Care – PPO | Admitting: Internal Medicine

## 2022-05-27 VITALS — BP 140/72 | HR 78 | Temp 98.2°F | Resp 18

## 2022-05-27 DIAGNOSIS — Z5112 Encounter for antineoplastic immunotherapy: Secondary | ICD-10-CM | POA: Insufficient documentation

## 2022-05-27 DIAGNOSIS — D492 Neoplasm of unspecified behavior of bone, soft tissue, and skin: Secondary | ICD-10-CM | POA: Diagnosis not present

## 2022-05-27 DIAGNOSIS — Z79630 Long term (current) use of alkylating agent: Secondary | ICD-10-CM | POA: Insufficient documentation

## 2022-05-27 DIAGNOSIS — C3431 Malignant neoplasm of lower lobe, right bronchus or lung: Secondary | ICD-10-CM | POA: Diagnosis present

## 2022-05-27 DIAGNOSIS — S60222A Contusion of left hand, initial encounter: Secondary | ICD-10-CM | POA: Diagnosis not present

## 2022-05-27 DIAGNOSIS — D6959 Other secondary thrombocytopenia: Secondary | ICD-10-CM | POA: Diagnosis not present

## 2022-05-27 DIAGNOSIS — Z79899 Other long term (current) drug therapy: Secondary | ICD-10-CM | POA: Diagnosis not present

## 2022-05-27 DIAGNOSIS — D61818 Other pancytopenia: Secondary | ICD-10-CM | POA: Diagnosis not present

## 2022-05-27 DIAGNOSIS — J45909 Unspecified asthma, uncomplicated: Secondary | ICD-10-CM | POA: Insufficient documentation

## 2022-05-27 DIAGNOSIS — J90 Pleural effusion, not elsewhere classified: Secondary | ICD-10-CM | POA: Diagnosis not present

## 2022-05-27 DIAGNOSIS — Z885 Allergy status to narcotic agent status: Secondary | ICD-10-CM | POA: Insufficient documentation

## 2022-05-27 LAB — TOTAL PROTEIN, URINE DIPSTICK: Protein, ur: NEGATIVE mg/dL

## 2022-05-27 LAB — CBC WITH DIFFERENTIAL (CANCER CENTER ONLY)
Abs Immature Granulocytes: 0.01 10*3/uL (ref 0.00–0.07)
Basophils Absolute: 0 10*3/uL (ref 0.0–0.1)
Basophils Relative: 1 %
Eosinophils Absolute: 0.1 10*3/uL (ref 0.0–0.5)
Eosinophils Relative: 3 %
HCT: 38.7 % (ref 36.0–46.0)
Hemoglobin: 12.8 g/dL (ref 12.0–15.0)
Immature Granulocytes: 0 %
Lymphocytes Relative: 17 %
Lymphs Abs: 0.9 10*3/uL (ref 0.7–4.0)
MCH: 31.8 pg (ref 26.0–34.0)
MCHC: 33.1 g/dL (ref 30.0–36.0)
MCV: 96 fL (ref 80.0–100.0)
Monocytes Absolute: 0.4 10*3/uL (ref 0.1–1.0)
Monocytes Relative: 8 %
Neutro Abs: 3.7 10*3/uL (ref 1.7–7.7)
Neutrophils Relative %: 71 %
Platelet Count: 144 10*3/uL — ABNORMAL LOW (ref 150–400)
RBC: 4.03 MIL/uL (ref 3.87–5.11)
RDW: 12.9 % (ref 11.5–15.5)
WBC Count: 5.2 10*3/uL (ref 4.0–10.5)
nRBC: 0 % (ref 0.0–0.2)

## 2022-05-27 LAB — CMP (CANCER CENTER ONLY)
ALT: 18 U/L (ref 0–44)
AST: 25 U/L (ref 15–41)
Albumin: 4.4 g/dL (ref 3.5–5.0)
Alkaline Phosphatase: 85 U/L (ref 38–126)
Anion gap: 6 (ref 5–15)
BUN: 13 mg/dL (ref 6–20)
CO2: 30 mmol/L (ref 22–32)
Calcium: 9.6 mg/dL (ref 8.9–10.3)
Chloride: 104 mmol/L (ref 98–111)
Creatinine: 0.75 mg/dL (ref 0.44–1.00)
GFR, Estimated: >60 mL/min (ref >60)
Glucose, Bld: 95 mg/dL (ref 70–99)
Potassium: 4 mmol/L (ref 3.5–5.1)
Sodium: 140 mmol/L (ref 135–145)
Total Bilirubin: 0.4 mg/dL (ref 0.3–1.2)
Total Protein: 7.1 g/dL (ref 6.5–8.1)

## 2022-05-27 MED ORDER — SODIUM CHLORIDE 0.9 % IV SOLN: Freq: Once | INTRAVENOUS | Status: AC

## 2022-05-27 MED ORDER — SODIUM CHLORIDE 0.9 % IV SOLN
5.0000 mg/kg | Freq: Once | INTRAVENOUS | Status: AC
  Administered 2022-05-27: 350 mg via INTRAVENOUS
  Filled 2022-05-27: qty 14

## 2022-05-27 NOTE — Patient Instructions (Signed)
[facility]  Discharge Instructions: Thank you for choosing [facility] to provide your oncology and hematology care.   If you have a lab appointment with the [facility], please go directly to the [facility] and check in at the registration area.   Wear comfortable clothing and clothing appropriate for easy access to any Portacath or PICC line.   We strive to give you quality time with your provider. You may need to reschedule your appointment if you arrive late (15 or more minutes).  Arriving late affects you and other patients whose appointments are after yours.  Also, if you miss three or more appointments without notifying the office, you may be dismissed from the clinic at the provider's discretion.      For prescription refill requests, have your pharmacy contact our office and allow 72 hours for refills to be completed.    Today you received the following chemotherapy and/or immunotherapy agent: Bevacizumab Noah Charon)   To help prevent nausea and vomiting after your treatment, we encourage you to take your nausea medication as directed.  BELOW ARE SYMPTOMS THAT SHOULD BE REPORTED IMMEDIATELY: *FEVER GREATER THAN 100.4 F (38 C) OR HIGHER *CHILLS OR SWEATING *NAUSEA AND VOMITING THAT IS NOT CONTROLLED WITH YOUR NAUSEA MEDICATION *UNUSUAL SHORTNESS OF BREATH *UNUSUAL BRUISING OR BLEEDING *URINARY PROBLEMS (pain or burning when urinating, or frequent urination) *BOWEL PROBLEMS (unusual diarrhea, constipation, pain near the anus) TENDERNESS IN MOUTH AND THROAT WITH OR WITHOUT PRESENCE OF ULCERS (sore throat, sores in mouth, or a toothache) UNUSUAL RASH, SWELLING OR PAIN  UNUSUAL VAGINAL DISCHARGE OR ITCHING   Items with * indicate a potential emergency and should be followed up as soon as possible or go to the Emergency Department if any problems should occur.  Please show the CHEMOTHERAPY ALERT CARD or IMMUNOTHERAPY ALERT CARD at  check-in to the Emergency Department and triage nurse.  Should you have questions after your visit or need to cancel or reschedule your appointment, please contact [facility]  Dept: [phone]  and follow the prompts.  Office hours are 8:00 a.m. to 4:30 p.m. Monday - Friday. Please note that voicemails left after 4:00 p.m. may not be returned until the following business day.  We are closed weekends and major holidays. You have access to a nurse at all times for urgent questions. Please call the main number to the clinic Dept: [phone] and follow the prompts.   For any non-urgent questions, you may also contact your provider using MyChart. We now offer e-Visits for anyone 44 and older to request care online for non-urgent symptoms. For details visit [email or web address].   Also download the MyChart app! Go to the app store, search "MyChart", open the app, select [facility], and log in with your MyChart username and password.  Masks are optional in the cancer centers. If you would like for your care team to wear a mask while they are taking care of you, please let them know. You may have one support person who is at least 59 years old accompany you for your appointments. Bevacizumab Injection What is this medication? BEVACIZUMAB (be va SIZ yoo mab) treats some types of cancer. It works by blocking a protein that causes cancer cells to grow and multiply. This helps to slow or stop the spread of cancer cells. It is a monoclonal antibody. This medicine may be used for other purposes; ask your health care provider or pharmacist if you  have questions. COMMON BRAND NAME(S): Alymsys, Avastin, MVASI, Noah Charon What should I tell my care team before I take this medication? They need to know if you have any of these conditions: Blood clots Coughing up blood Having or recent surgery Heart failure High blood pressure History of a connection between 2 or more body parts  that do not usually connect (fistula) History of a tear in your stomach or intestines Protein in your urine An unusual or allergic reaction to bevacizumab, other medications, foods, dyes, or preservatives Pregnant or trying to get pregnant Breast-feeding How should I use this medication? This medication is injected into a vein. It is given by your care team in a hospital or clinic setting. Talk to your care team the use of this medication in children. Special care may be needed. Overdosage: If you think you have taken too much of this medicine contact a poison control center or emergency room at once. NOTE: This medicine is only for you. Do not share this medicine with others. What if I miss a dose? Keep appointments for follow-up doses. It is important not to miss your dose. Call your care team if you are unable to keep an appointment. What may interact with this medication? Interactions are not expected. This list may not describe all possible interactions. Give your health care provider a list of all the medicines, herbs, non-prescription drugs, or dietary supplements you use. Also tell them if you smoke, drink alcohol, or use illegal drugs. Some items may interact with your medicine. What should I watch for while using this medication? Your condition will be monitored carefully while you are receiving this medication. You may need blood work while taking this medication. This medication may make you feel generally unwell. This is not uncommon as chemotherapy can affect healthy cells as well as cancer cells. Report any side effects. Continue your course of treatment even though you feel ill unless your care team tells you to stop. This medication may increase your risk to bruise or bleed. Call your care team if you notice any unusual bleeding. Before having surgery, talk to your care team to make sure it is ok. This medication can increase the risk of poor healing of your surgical site or  wound. You will need to stop this medication for 28 days before surgery. After surgery, wait at least 28 days before restarting this medication. Make sure the surgical site or wound is healed enough before restarting this medication. Talk to your care team if questions. Talk to your care team if you may be pregnant. Serious birth defects can occur if you take this medication during pregnancy and for 6 months after the last dose. Contraception is recommended while taking this medication and for 6 months after the last dose. Your care team can help you find the option that works for you. Do not breastfeed while taking this medication and for 6 months after the last dose. This medication can cause infertility. Talk to your care team if you are concerned about your fertility. What side effects may I notice from receiving this medication? Side effects that you should report to your care team as soon as possible: Allergic reactions--skin rash, itching, hives, swelling of the face, lips, tongue, or throat Bleeding--bloody or black, tar-like stools, vomiting blood or brown material that looks like coffee grounds, red or dark brown urine, small red or purple spots on skin, unusual bruising or bleeding Blood clot--pain, swelling, or warmth in the leg, shortness of breath,  chest pain Heart attack--pain or tightness in the chest, shoulders, arms, or jaw, nausea, shortness of breath, cold or clammy skin, feeling faint or lightheaded Heart failure--shortness of breath, swelling of the ankles, feet, or hands, sudden weight gain, unusual weakness or fatigue Increase in blood pressure Infection--fever, chills, cough, sore throat, wounds that don't heal, pain or trouble when passing urine, general feeling of discomfort or being unwell Infusion reactions--chest pain, shortness of breath or trouble breathing, feeling faint or lightheaded Kidney injury--decrease in the amount of urine, swelling of the ankles, hands, or  feet Stomach pain that is severe, does not go away, or gets worse Stroke--sudden numbness or weakness of the face, arm, or leg, trouble speaking, confusion, trouble walking, loss of balance or coordination, dizziness, severe headache, change in vision Sudden and severe headache, confusion, change in vision, seizures, which may be signs of posterior reversible encephalopathy syndrome (PRES) Side effects that usually do not require medical attention (report to your care team if they continue or are bothersome): Back pain Change in taste Diarrhea Dry skin Increased tears Nosebleed This list may not describe all possible side effects. Call your doctor for medical advice about side effects. You may report side effects to FDA at 1-800-FDA-1088. Where should I keep my medication? This medication is given in a hospital or clinic. It will not be stored at home. NOTE: This sheet is a summary. It may not cover all possible information. If you have questions about this medicine, talk to your doctor, pharmacist, or health care provider.  2023 Elsevier/Gold Standard (2021-12-25 00:00:00)

## 2022-05-27 NOTE — Progress Notes (Signed)
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  PRIOR THERAPY:  1) Status post right Pleurx catheter placement for drainage of recurrent right pleural effusion. 2) repeat biopsy on August 11, 2018 of the lung mass at [facility] and it showed atypical spindle cell proliferation. 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 4) restaging CT scan of the chest on September 14, 2019 showed new 1.5 cm soft tissue lesion along the right subpulmonic diaphragmatic pleural surface worrisome for tumor recurrence.  She was followed by observation by Dr. Angelina Ok at [place] [facility] 5) SBRT to the right lower lobe/diaphragmatic lesion completed June 22, 2020. 6) restaging scan on November 30, 2020 showed new right posterior pleural-based nodule followed by observation.  But repeat CT scan of the chest on 02/07/2021 showed enlarging right pleural mass/nodules. 7) the patient is started treatment with pazopanib on 02/17/2021 400 mg titrated to 800 mg p.o. daily but her treatment was held between July 8 to March 05, 2021 secondary to fatigue and arthralgia.  She resumed her treatment at a dose of 400 mg p.o. daily for 3 days titrated to 600 mg p.o. daily but this was again held in mid August 2022.  She resumed her treatment on 04/23/2021 at 400 mg p.o. daily but this was discontinued in October 2022 secondary to disease progression was a scan on 05/30/2021 revealing progressive multifocal pleural-based disease.  CURRENT  THERAPY: Temodar 150 Mg/M2 daily for days 1-7 and 15-21 every 4 weeks in addition to a Avastin 5 Mg/KG on days 8 and 22 every 4 weeks.  Started October 15, 2021.  Starting from cycle #3 her dose of Temodar was reduced to 140 mg once daily on days 1-7 and 15-21 every 4 weeks.  Status post 5 cycles of treatment.  Starting from cycle #4 her treatment was reduced to Temodar 140 Mg on days 1-7 and Avastin 5 Mg/KG on day 8 every 4 weeks based on her request secondary to intolerance.  INTERVAL HISTORY: Brenda Patel 61 y.o. female returns to the clinic today for follow-up visit.  The patient is feeling fine today with no concerning complaints except for feeling cold especially in her fingers and she was complaining about being stuck 3 times to find an IV for her lab earlier today.  She was seen by IR and consider for a different kind of Port-A-Cath but the patient asked them to hold on this procedure until after her next scan in November.  She denied having any chest pain, shortness of breath, cough or hemoptysis.  She has no nausea, vomiting, diarrhea or constipation.  She has no headache or visual changes.  She is here today for evaluation before starting day 8 of cycle #6.  MEDICAL HISTORY: Past Medical History:  Diagnosis Date   Asthma    Cancer ([place])    Depression    Dyspnea    Pleural effusion on right    Pleural mass     ALLERGIES:  is allergic to cyanoacrylate, other, wound dressing adhesive, codeine, and lentil.  MEDICATIONS:  Current Outpatient Medications  Medication Sig Dispense Refill   Ascorbic Acid (VITAMIN C) 100 MG tablet Take 100 mg by mouth every other day.     Calcium Citrate 333 MG TABS Take 333 mg by mouth daily.     [employer] 12 HOUR NASAL DECONGESTANT 120 MG 12 hr tablet Take 120 mg by mouth daily.  10   fluticasone (FLONASE) 50 MCG/ACT nasal spray Place 1 spray into both nostrils daily as needed for allergies.  5   meloxicam (MOBIC) 15 MG tablet Take 15 mg by mouth daily as  needed for pain.      montelukast (SINGULAIR) 10 MG tablet Take 10 mg by mouth daily after supper.  10   Multiple Vitamins-Iron (MULTIVITAMINS WITH IRON) TABS tablet Take 1 tablet by mouth every other day. 65 mg     ondansetron (ZOFRAN) 8 MG tablet Take 1 tablet (8 mg total) by mouth every 8 (eight) hours as needed for nausea or vomiting. 30 tablet 2   temozolomide (TEMODAR) 140 MG capsule Take 1 capsule (140 mg total) by mouth daily. Take on days 1-7 and days 15-21 of each 28 day cycle. May take on an empty stomach or at bedtime to decrease nausea & vomiting. 14 capsule 3   No current facility-administered medications for this visit.    SURGICAL HISTORY:  Past Surgical History:  Procedure Laterality Date   CHEST TUBE INSERTION Right 07/16/2018   Procedure: INSERTION PLEURAL DRAINAGE CATHETER, right;  Surgeon: Grace Isaac, MD;  Location: [place];  Service: Thoracic;  Laterality: Right;   COLONOSCOPY  2017   GUM SURGERY  1984   GRAFTS   IR IMAGING GUIDED PORT INSERTION  10/02/2021   IR RADIOLOGIST EVAL & MGMT  10/08/2021   IR RADIOLOGIST EVAL & MGMT  10/11/2021   IR RADIOLOGIST EVAL & MGMT  04/17/2022   IR REMOVAL TUN ACCESS W/ PORT W/O FL MOD SED  10/15/2021   IR THORACENTESIS ASP PLEURAL SPACE W/IMG GUIDE  07/03/2018    REVIEW OF SYSTEMS:  A comprehensive review of systems was negative.   PHYSICAL EXAMINATION: General appearance: alert, cooperative, and no distress Head: Normocephalic, without obvious abnormality, atraumatic Neck: no adenopathy, no JVD, supple, symmetrical, trachea midline, and thyroid not enlarged, symmetric, no tenderness/mass/nodules Lymph nodes: Cervical, supraclavicular, and axillary nodes normal. Resp: clear to auscultation bilaterally Back: symmetric, no curvature. ROM normal. No CVA tenderness. Cardio: regular rate and rhythm, S1, S2 normal, no murmur, click, rub or gallop GI: soft, non-tender; bowel sounds normal; no masses,  no organomegaly Extremities:  extremities normal, atraumatic, no cyanosis or edema  ECOG PERFORMANCE STATUS: 1 - Symptomatic but completely ambulatory  Blood pressure (!) 143/73, pulse 94, temperature 97.8 F (36.6 C), temperature source Oral, resp. rate 17, weight 156 lb 8 oz (71 kg), SpO2 99 %.  LABORATORY DATA: Lab Results  Component Value Date   WBC 5.2 05/27/2022   HGB 12.8 05/27/2022   HCT 38.7 05/27/2022   MCV 96.0 05/27/2022   PLT 144 (L) 05/27/2022      Chemistry      Component Value Date/Time   NA 138 04/30/2022 1354   K 3.7 04/30/2022 1354   CL 101 04/30/2022 1354   CO2 31 04/30/2022 1354   BUN 17 04/30/2022 1354   CREATININE 0.86 04/30/2022 1354      Component Value Date/Time   CALCIUM 10.2 04/30/2022 1354   ALKPHOS 88 04/30/2022 1354   AST 25 04/30/2022 1354   ALT 16 04/30/2022  1354   BILITOT 0.3 04/30/2022 1354       RADIOGRAPHIC STUDIES: No results found.   ASSESSMENT AND PLAN: This is a very pleasant 61 years old white female presented with large right lower lobe lung mass in addition to right pleural effusion.  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 The patient was referred to Dr. Angelina Ok at [place] for second opinion and she underwent several studies and intervention as listed below 1) repeat biopsy on August 11, 2018 of the lung mass at [facility] and it showed atypical spindle cell proliferation. 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 3) restaging CT scan of the chest on September 14, 2019 showed new 1.5 cm soft tissue lesion along the right subpulmonic diaphragmatic pleural surface worrisome for tumor recurrence.  She was followed by observation by Dr. Angelina Ok at [place] [facility] 4)  SBRT to the right lower lobe/diaphragmatic lesion completed June 22, 2020. 5) restaging scan on November 30, 2020 showed new right posterior pleural-based nodule followed by observation.  But repeat CT scan of the chest on 02/07/2021 showed enlarging right pleural mass/nodules. 6) the patient is started treatment with pazopanib on 02/17/2021 400 mg titrated to 800 mg p.o. daily but her treatment was held between July 8 to March 05, 2021 secondary to fatigue and arthralgia.  She resumed her treatment at a dose of 400 mg p.o. daily for 3 days titrated to 600 mg p.o. daily but this was again held in mid August 2022.  She resumed her treatment on 04/23/2021 at 400 mg p.o. daily but this was discontinued in October 2022 secondary to disease progression was a scan on 05/30/2021 revealing progressive multifocal pleural-based disease. Dr. Angelina Ok recommended for the patient treatment with Temodar and Avastin.  She started Temodar 150 Mg/M2 on days 1 and 7 as well as day 15-21 in addition to Avastin 5 Mg/KG on days 1 and 22 every 4 weeks.  She is status post 2 cycles.   Her treatment was complicated with significant pancytopenia especially thrombocytopenia secondary to the treatment with Temodar.  It took her several weeks to recover from this adverse effects. She resumed her treatment recently with reduced dose Temodar 140 mg p.o. on days 1-7 and 15-22 and she is tolerating it much better.  Status post 5 cycle with reduced regimen.  Starting from cycle #4 she is on treatment with Temodar 140 Mg on days 1-7 and Avastin 5 Mg/KG on day 8 every 4 weeks.   The patient has been tolerating this treatment well with no concerning adverse effects.  Her lab values are good today for treatment. I recommended for her to proceed with day 8 of cycle #6 today as planned. I will see her back for follow-up visit in 4 weeks for evaluation on day 8 of cycle #7. She is expected to have repeat imaging studies at [facility] in November  2023 under the care of Dr. Angelina Ok. The patient was advised to call immediately if she has any other concerning symptoms in the interval. The patient voices understanding of current disease status and treatment options and is in agreement with the current care plan.  All questions were answered. The patient knows to call the clinic with any problems, questions or concerns. We  can certainly see the patient much sooner if necessary.  The total time spent in the appointment was 20 minutes.  Disclaimer: This note was dictated with voice recognition software. Similar sounding words can inadvertently be transcribed and may not be corrected upon review.

## 2022-05-28 ENCOUNTER — Other Ambulatory Visit: Payer: Self-pay

## 2022-05-29 ENCOUNTER — Other Ambulatory Visit: Payer: Self-pay

## 2022-05-30 ENCOUNTER — Other Ambulatory Visit (HOSPITAL_COMMUNITY): Payer: Self-pay

## 2022-06-04 ENCOUNTER — Other Ambulatory Visit: Payer: BC Managed Care – PPO

## 2022-06-04 ENCOUNTER — Ambulatory Visit: Payer: BC Managed Care – PPO | Admitting: Internal Medicine

## 2022-06-04 ENCOUNTER — Ambulatory Visit: Payer: BC Managed Care – PPO

## 2022-06-05 ENCOUNTER — Ambulatory Visit: Payer: BC Managed Care – PPO | Admitting: Internal Medicine

## 2022-06-05 ENCOUNTER — Ambulatory Visit: Payer: BC Managed Care – PPO

## 2022-06-05 ENCOUNTER — Other Ambulatory Visit: Payer: Self-pay

## 2022-06-05 ENCOUNTER — Other Ambulatory Visit: Payer: BC Managed Care – PPO

## 2022-06-12 ENCOUNTER — Other Ambulatory Visit (HOSPITAL_COMMUNITY): Payer: Self-pay

## 2022-06-21 NOTE — Progress Notes (Unsigned)
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  PRIOR THERAPY: 1) Status post right Pleurx catheter placement for drainage of recurrent right pleural effusion. 2) repeat biopsy on August 11, 2018 of the lung mass at [facility] and it showed atypical spindle cell proliferation. 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 4) restaging CT scan of the chest on September 14, 2019 showed new 1.5 cm soft tissue lesion along the right subpulmonic diaphragmatic pleural surface worrisome for tumor recurrence.  She was followed by observation by Dr. Angelina Ok at [place] [facility] 5) SBRT to the right lower lobe/diaphragmatic lesion completed June 22, 2020. 6) restaging scan on November 30, 2020 showed new right posterior pleural-based nodule followed by observation.  But repeat CT scan of the chest on 02/07/2021 showed enlarging right pleural mass/nodules. 7) the patient is started treatment with pazopanib on 02/17/2021 400 mg titrated to 800 mg p.o. daily but her treatment was held between July 8 to March 05, 2021 secondary to fatigue and arthralgia.  She resumed her treatment at a dose of 400 mg p.o. daily for 3 days titrated to 600 mg p.o. daily but this was again held in mid August 2022.  She resumed her treatment on 04/23/2021 at 400 mg p.o. daily but this was discontinued in October 2022 secondary to disease progression was a scan on 05/30/2021 revealing progressive multifocal pleural-based disease.    CURRENT THERAPY: Temodar 150 Mg/M2 daily for days 1-7 and 15-21  every 4 weeks in addition to a Avastin 5 Mg/KG on days 8 and 22 every 4 weeks.  Started October 15, 2021.  Starting from cycle #3 her dose of Temodar was reduced to 140 mg once daily on days 1-7 and 15-21 every 4 weeks. Status post 6 cycles of treatment. Starting from cycle #4, the patient opted to undergo temodar for days 1-7 and avastin on day 8 once every 4 weeks starting on 03/25/22.   INTERVAL HISTORY: Brenda Patel 61 y.o. female returns to the clinic today for follow-up a visit. The patient was last seen 4 weeks ago by Dr. Julien Nordmann on 05/27/2022.  The patient is currently undergoing dose reduced Temodar and Avastin.  She is tolerating the reduced dose of Temodar fairly well.  She is expected to see Dr. Angelina Ok from [facility] for repeat imaging studies on 07/03/22.  She is hoping that after her repeat imaging studies that she will arrange for a repeat Port-A-Cath insertion with a different brand Port-A-Cath.  She had a allergy to her last brand of her Port-A-Cath which led to it ultimately being removed.   Otherwise since last being seen, the patient denies any changes in her health. She was active cleaning the gutters yesterday outside. Denies any fever, chills, night sweats, or unexplained weight loss. She denies shortness of breath but has decreased activity tolerance and tires quicker. Denies any abnormal bleeding except a few mild self limiting nose bleeds which she believes is moreso related to allergies and dry nasal mucosa. Because of her significant thrombocytopenia/bleeding with full dose temodar in the past, she monitors for bleeding closely. She has  a bruise on her left hand after hitting it. Denies any chest pain, shortness of breath, cough, or hemoptysis.  Denies any abdominal pain.  She is here today for evaluation and repeat blood work before undergoing day 8 cycle 7.    MEDICAL HISTORY: Past Medical History:  Diagnosis Date   Asthma    Cancer ([place])    Depression     Dyspnea    Pleural effusion on right    Pleural mass     ALLERGIES:  is allergic to cyanoacrylate, other, wound dressing adhesive, codeine, and lentil.  MEDICATIONS:  Current Outpatient Medications  Medication Sig Dispense Refill   Ascorbic Acid (VITAMIN C) 100 MG tablet Take 100 mg by mouth every other day.     Calcium Citrate 333 MG TABS Take 333 mg by mouth daily.     [employer]S 12 HOUR NASAL DECONGESTANT 120 MG 12 hr tablet Take 120 mg by mouth daily.  10   fluticasone (FLONASE) 50 MCG/ACT nasal spray Place 1 spray into both nostrils daily as needed for allergies.  5   meloxicam (MOBIC) 15 MG tablet Take 15 mg by mouth daily as needed for pain.      montelukast (SINGULAIR) 10 MG tablet Take 10 mg by mouth daily after supper.  10   Multiple Vitamins-Iron (MULTIVITAMINS WITH IRON) TABS tablet Take 1 tablet by mouth every other day. 65 mg     ondansetron (ZOFRAN) 8 MG tablet Take 1 tablet (8 mg total) by mouth every 8 (eight) hours as needed for nausea or vomiting. 30 tablet 2   temozolomide (TEMODAR) 140 MG capsule Take 1 capsule (140 mg total) by mouth daily. Take on days 1-7 and days 15-21 of each 28 day cycle. May take on an empty stomach or at bedtime to decrease nausea & vomiting. 14 capsule 3   No current facility-administered medications for this visit.    SURGICAL HISTORY:  Past Surgical History:  Procedure Laterality Date   CHEST TUBE INSERTION Right 07/16/2018   Procedure: INSERTION PLEURAL DRAINAGE CATHETER, right;  Surgeon: Grace Isaac, MD;  Location: [facility];  Service: Thoracic;  Laterality: Right;   COLONOSCOPY  2017   GUM SURGERY  1984   GRAFTS   IR IMAGING GUIDED PORT INSERTION  10/02/2021   IR RADIOLOGIST EVAL & MGMT  10/08/2021   IR RADIOLOGIST EVAL & MGMT  10/11/2021   IR RADIOLOGIST EVAL & MGMT  04/17/2022   IR REMOVAL TUN ACCESS W/ PORT W/O FL MOD SED  10/15/2021   IR THORACENTESIS ASP PLEURAL SPACE W/IMG GUIDE  07/03/2018    REVIEW OF SYSTEMS:   Review of  Systems  Constitutional: Negative for appetite change, chills, fatigue, fever and unexpected weight change.  HENT: Negative for mouth sores, nosebleeds, sore throat and trouble swallowing.   Eyes: Negative for eye problems and icterus.  Respiratory: Negative for cough, hemoptysis, shortness of breath and wheezing.   Cardiovascular: Negative for chest pain and leg swelling.  Gastrointestinal: Negative for abdominal pain, constipation, diarrhea, nausea and vomiting.  Genitourinary: Negative for bladder incontinence, difficulty urinating, dysuria, frequency and hematuria.   Musculoskeletal: Negative for back pain, gait problem, neck pain and neck stiffness.  Skin: Negative for itching and rash.  Neurological: Negative for dizziness, extremity weakness, gait problem, headaches, light-headedness and seizures.  Hematological: Negative for adenopathy. Does not bleed easily. Positive for easy bruising.  Psychiatric/Behavioral: Negative for confusion, depression and sleep disturbance. The patient is not nervous/anxious.     PHYSICAL EXAMINATION:  Blood pressure 134/85, pulse 100, temperature 97.9 F (36.6 C), temperature source Temporal, resp. rate 15, weight 157 lb 9.6 oz (71.5 kg), SpO2 98 %.  ECOG PERFORMANCE STATUS: 1  Physical Exam  Constitutional: Oriented to person, place, and time and well-developed, well-nourished, and in no distress.  HENT:  Head: Normocephalic and atraumatic.  Mouth/Throat: Oropharynx is clear and moist. No oropharyngeal exudate.  Eyes: Conjunctivae are normal. Right eye exhibits no discharge. Left eye exhibits no discharge. No scleral icterus.  Neck: Normal range of motion. Neck supple.  Cardiovascular: Normal rate, regular rhythm, normal heart sounds and intact distal pulses.   Pulmonary/Chest: Effort normal and breath sounds normal. No respiratory distress. No wheezes. No rales.  Abdominal: Soft. Bowel sounds are normal. Exhibits no distension and no mass. There is  no tenderness.  Musculoskeletal: Normal range of motion. Exhibits no edema.  Lymphadenopathy:    No cervical adenopathy.  Neurological: Alert and oriented to person, place, and time. Exhibits normal muscle tone. Gait normal. Coordination normal.  Skin: Bruise on left hand. Skin is warm and dry. No rash noted. Not diaphoretic. No erythema. No pallor.  Psychiatric: Mood, memory and judgment normal.  Vitals reviewed.  LABORATORY DATA: Lab Results  Component Value Date   WBC 4.4 06/24/2022   HGB 12.7 06/24/2022   HCT 37.9 06/24/2022   MCV 96.2 06/24/2022   PLT 129 (L) 06/24/2022      Chemistry      Component Value Date/Time   NA 140 06/24/2022 0929   K 3.9 06/24/2022 0929   CL 104 06/24/2022 0929   CO2 29 06/24/2022 0929   BUN 16 06/24/2022 0929   CREATININE 0.78 06/24/2022 0929      Component Value Date/Time   CALCIUM 9.4 06/24/2022 0929   ALKPHOS 84 06/24/2022 0929   AST 24 06/24/2022 0929   ALT 16 06/24/2022 0929   BILITOT 0.5 06/24/2022 0929       RADIOGRAPHIC STUDIES:  No results found.   ASSESSMENT/PLAN:  This is a very pleasant 61 years old Caucasian female presented with large right lower lobe lung mass in addition to right pleural effusion.  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 The patient was referred to Dr. Angelina Ok at [facility] for second opinion and she underwent several studies and intervention as listed below   1) repeat biopsy on August 11, 2018 of the lung mass at [facility] and it showed atypical spindle cell proliferation. 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 3) restaging CT scan of the chest on September 14, 2019 showed new 1.5 cm soft tissue lesion along the right  subpulmonic diaphragmatic pleural surface worrisome for tumor recurrence.  She was followed by observation by Dr. Angelina Ok at [place] [facility] 4) SBRT to the right lower lobe/diaphragmatic lesion completed June 22, 2020. 5) restaging scan on November 30, 2020 showed new right posterior pleural-based nodule followed by observation.  But repeat CT scan of the chest on 02/07/2021 showed enlarging right pleural mass/nodules. 6) the patient is started treatment with pazopanib on 02/17/2021 400 mg titrated to 800 mg p.o. daily but her treatment was held between July 8 to March 05, 2021 secondary to fatigue and arthralgia.  She resumed her treatment at a dose of 400 mg p.o.  daily for 3 days titrated to 600 mg p.o. daily but this was again held in mid August 2022.  She resumed her treatment on 04/23/2021 at 400 mg p.o. daily but this was discontinued in October 2022 secondary to disease progression was a scan on 05/30/2021 revealing progressive multifocal pleural-based disease.. There was also a scan on 09/20/21 which showed progressive multifocal pleural based disease.    Dr. Angelina Ok recommended she consider treatment with Avastin and Temodar. She requested delaying starting treatment until after the holidays. She is on Avastin 5 mg/kg milligrams on days 8 and 22 every 4 weeks with Temodar p.o on days 1-7 and 15-21 every 4 weeks. Her Temodar had been on hold for several months due to significant thrombocytopenia requiring numerous platelet transfusions.  In June 2023, her lab work improved.  Her Temodar was resumed at a 50% dose reduction.  The patient is currently taking 140 milligrams days 1-7 only and Avastin only on day 8 every 4 weeks. Thus far, she has been tolerating this well.  She is here for day 8 cycle 7 today.  She is expected to have repeat imaging studies on 07/03/22 at [facility].    Labs were reviewed.  Recommend that she proceed with cycle 7-day 8 today as scheduled.  We will see her back for  follow-up visit in 4 weeks for evaluation repeat blood work before undergoing day 8 cycle 8.  The patient may consider getting a Port-A-Cath after her repeat imaging study.  She already had a consultation with IR and they discussed alternative brands of the Port-A-Cath since she has a history of a severe allergy to the last brand of her Port-A-Cath. We discussed she should get this at least 7-10 days before or after an avastin infusion due to the effects of delayed wound healing. Therefore, would recommend she have this performed around the week of 11/13.   She is wondering when the best time would be getting her COVID-19 booster. Also recommended getting this in between her cycle of treatments when she is off temodar and avastin.   The patient was advised to call immediately if she has any concerning symptoms in the interval. The patient voices understanding of current disease status and treatment options and is in agreement with the current care plan. All questions were answered. The patient knows to call the clinic with any problems, questions or concerns. We can certainly see the patient much sooner if necessary           No orders of the defined types were placed in this encounter.    The total time spent in the appointment was [place], PA-C 06/24/22

## 2022-06-23 ENCOUNTER — Other Ambulatory Visit: Payer: Self-pay

## 2022-06-24 ENCOUNTER — Inpatient Hospital Stay (HOSPITAL_BASED_OUTPATIENT_CLINIC_OR_DEPARTMENT_OTHER): Payer: BC Managed Care – PPO | Admitting: Physician Assistant

## 2022-06-24 ENCOUNTER — Inpatient Hospital Stay: Payer: BC Managed Care – PPO

## 2022-06-24 VITALS — BP 133/81 | HR 88 | Resp 16

## 2022-06-24 VITALS — BP 134/85 | HR 100 | Temp 97.9°F | Resp 15 | Wt 157.6 lb

## 2022-06-24 DIAGNOSIS — D492 Neoplasm of unspecified behavior of bone, soft tissue, and skin: Secondary | ICD-10-CM

## 2022-06-24 DIAGNOSIS — C3431 Malignant neoplasm of lower lobe, right bronchus or lung: Secondary | ICD-10-CM | POA: Diagnosis not present

## 2022-06-24 DIAGNOSIS — C3491 Malignant neoplasm of unspecified part of right bronchus or lung: Secondary | ICD-10-CM

## 2022-06-24 DIAGNOSIS — Z5111 Encounter for antineoplastic chemotherapy: Secondary | ICD-10-CM

## 2022-06-24 LAB — CBC WITH DIFFERENTIAL (CANCER CENTER ONLY)
Abs Immature Granulocytes: 0.01 10*3/uL (ref 0.00–0.07)
Basophils Absolute: 0 10*3/uL (ref 0.0–0.1)
Basophils Relative: 1 %
Eosinophils Absolute: 0.1 10*3/uL (ref 0.0–0.5)
Eosinophils Relative: 3 %
HCT: 37.9 % (ref 36.0–46.0)
Hemoglobin: 12.7 g/dL (ref 12.0–15.0)
Immature Granulocytes: 0 %
Lymphocytes Relative: 19 %
Lymphs Abs: 0.8 10*3/uL (ref 0.7–4.0)
MCH: 32.2 pg (ref 26.0–34.0)
MCHC: 33.5 g/dL (ref 30.0–36.0)
MCV: 96.2 fL (ref 80.0–100.0)
Monocytes Absolute: 0.4 10*3/uL (ref 0.1–1.0)
Monocytes Relative: 10 %
Neutro Abs: 3 10*3/uL (ref 1.7–7.7)
Neutrophils Relative %: 67 %
Platelet Count: 129 10*3/uL — ABNORMAL LOW (ref 150–400)
RBC: 3.94 MIL/uL (ref 3.87–5.11)
RDW: 13.2 % (ref 11.5–15.5)
WBC Count: 4.4 10*3/uL (ref 4.0–10.5)
nRBC: 0 % (ref 0.0–0.2)

## 2022-06-24 LAB — TOTAL PROTEIN, URINE DIPSTICK: Protein, ur: NEGATIVE mg/dL

## 2022-06-24 LAB — CMP (CANCER CENTER ONLY)
ALT: 16 U/L (ref 0–44)
AST: 24 U/L (ref 15–41)
Albumin: 4.2 g/dL (ref 3.5–5.0)
Alkaline Phosphatase: 84 U/L (ref 38–126)
Anion gap: 7 (ref 5–15)
BUN: 16 mg/dL (ref 6–20)
CO2: 29 mmol/L (ref 22–32)
Calcium: 9.4 mg/dL (ref 8.9–10.3)
Chloride: 104 mmol/L (ref 98–111)
Creatinine: 0.78 mg/dL (ref 0.44–1.00)
GFR, Estimated: >60 mL/min (ref >60)
Glucose, Bld: 91 mg/dL (ref 70–99)
Potassium: 3.9 mmol/L (ref 3.5–5.1)
Sodium: 140 mmol/L (ref 135–145)
Total Bilirubin: 0.5 mg/dL (ref 0.3–1.2)
Total Protein: 7.4 g/dL (ref 6.5–8.1)

## 2022-06-24 MED ORDER — SODIUM CHLORIDE 0.9 % IV SOLN
5.0000 mg/kg | Freq: Once | INTRAVENOUS | Status: AC
  Administered 2022-06-24: 350 mg via INTRAVENOUS
  Filled 2022-06-24: qty 14

## 2022-06-24 MED ORDER — SODIUM CHLORIDE 0.9 % IV SOLN: Freq: Once | INTRAVENOUS | Status: AC

## 2022-06-24 NOTE — Patient Instructions (Signed)
[facility]  Discharge Instructions: Thank you for choosing [facility] to provide your oncology and hematology care.   If you have a lab appointment with the [facility], please go directly to the [facility] and check in at the registration area.   Wear comfortable clothing and clothing appropriate for easy access to any Portacath or PICC line.   We strive to give you quality time with your provider. You may need to reschedule your appointment if you arrive late (15 or more minutes).  Arriving late affects you and other patients whose appointments are after yours.  Also, if you miss three or more appointments without notifying the office, you may be dismissed from the clinic at the provider's discretion.      For prescription refill requests, have your pharmacy contact our office and allow 72 hours for refills to be completed.    Today you received the following chemotherapy and/or immunotherapy agent: Bevacizumab Noah Charon)   To help prevent nausea and vomiting after your treatment, we encourage you to take your nausea medication as directed.  BELOW ARE SYMPTOMS THAT SHOULD BE REPORTED IMMEDIATELY: *FEVER GREATER THAN 100.4 F (38 C) OR HIGHER *CHILLS OR SWEATING *NAUSEA AND VOMITING THAT IS NOT CONTROLLED WITH YOUR NAUSEA MEDICATION *UNUSUAL SHORTNESS OF BREATH *UNUSUAL BRUISING OR BLEEDING *URINARY PROBLEMS (pain or burning when urinating, or frequent urination) *BOWEL PROBLEMS (unusual diarrhea, constipation, pain near the anus) TENDERNESS IN MOUTH AND THROAT WITH OR WITHOUT PRESENCE OF ULCERS (sore throat, sores in mouth, or a toothache) UNUSUAL RASH, SWELLING OR PAIN  UNUSUAL VAGINAL DISCHARGE OR ITCHING   Items with * indicate a potential emergency and should be followed up as soon as possible or go to the Emergency Department if any problems should occur.  Please show the CHEMOTHERAPY ALERT CARD or IMMUNOTHERAPY ALERT CARD at  check-in to the Emergency Department and triage nurse.  Should you have questions after your visit or need to cancel or reschedule your appointment, please contact [facility]  Dept: [phone]  and follow the prompts.  Office hours are 8:00 a.m. to 4:30 p.m. Monday - Friday. Please note that voicemails left after 4:00 p.m. may not be returned until the following business day.  We are closed weekends and major holidays. You have access to a nurse at all times for urgent questions. Please call the main number to the clinic Dept: [phone] and follow the prompts.   For any non-urgent questions, you may also contact your provider using MyChart. We now offer e-Visits for anyone 47 and older to request care online for non-urgent symptoms. For details visit [email or web address].   Also download the MyChart app! Go to the app store, search "MyChart", open the app, select [facility], and log in with your MyChart username and password.  Masks are optional in the cancer centers. If you would like for your care team to wear a mask while they are taking care of you, please let them know. You may have one support person who is at least 59 years old accompany you for your appointments. Bevacizumab Injection What is this medication? BEVACIZUMAB (be va SIZ yoo mab) treats some types of cancer. It works by blocking a protein that causes cancer cells to grow and multiply. This helps to slow or stop the spread of cancer cells. It is a monoclonal antibody. This medicine may be used for other purposes; ask your health care provider or pharmacist if you  have questions. COMMON BRAND NAME(S): Alymsys, Avastin, MVASI, Noah Charon What should I tell my care team before I take this medication? They need to know if you have any of these conditions: Blood clots Coughing up blood Having or recent surgery Heart failure High blood pressure History of a connection between 2 or more body parts  that do not usually connect (fistula) History of a tear in your stomach or intestines Protein in your urine An unusual or allergic reaction to bevacizumab, other medications, foods, dyes, or preservatives Pregnant or trying to get pregnant Breast-feeding How should I use this medication? This medication is injected into a vein. It is given by your care team in a hospital or clinic setting. Talk to your care team the use of this medication in children. Special care may be needed. Overdosage: If you think you have taken too much of this medicine contact a poison control center or emergency room at once. NOTE: This medicine is only for you. Do not share this medicine with others. What if I miss a dose? Keep appointments for follow-up doses. It is important not to miss your dose. Call your care team if you are unable to keep an appointment. What may interact with this medication? Interactions are not expected. This list may not describe all possible interactions. Give your health care provider a list of all the medicines, herbs, non-prescription drugs, or dietary supplements you use. Also tell them if you smoke, drink alcohol, or use illegal drugs. Some items may interact with your medicine. What should I watch for while using this medication? Your condition will be monitored carefully while you are receiving this medication. You may need blood work while taking this medication. This medication may make you feel generally unwell. This is not uncommon as chemotherapy can affect healthy cells as well as cancer cells. Report any side effects. Continue your course of treatment even though you feel ill unless your care team tells you to stop. This medication may increase your risk to bruise or bleed. Call your care team if you notice any unusual bleeding. Before having surgery, talk to your care team to make sure it is ok. This medication can increase the risk of poor healing of your surgical site or  wound. You will need to stop this medication for 28 days before surgery. After surgery, wait at least 28 days before restarting this medication. Make sure the surgical site or wound is healed enough before restarting this medication. Talk to your care team if questions. Talk to your care team if you may be pregnant. Serious birth defects can occur if you take this medication during pregnancy and for 6 months after the last dose. Contraception is recommended while taking this medication and for 6 months after the last dose. Your care team can help you find the option that works for you. Do not breastfeed while taking this medication and for 6 months after the last dose. This medication can cause infertility. Talk to your care team if you are concerned about your fertility. What side effects may I notice from receiving this medication? Side effects that you should report to your care team as soon as possible: Allergic reactions--skin rash, itching, hives, swelling of the face, lips, tongue, or throat Bleeding--bloody or black, tar-like stools, vomiting blood or brown material that looks like coffee grounds, red or dark brown urine, small red or purple spots on skin, unusual bruising or bleeding Blood clot--pain, swelling, or warmth in the leg, shortness of breath,  chest pain Heart attack--pain or tightness in the chest, shoulders, arms, or jaw, nausea, shortness of breath, cold or clammy skin, feeling faint or lightheaded Heart failure--shortness of breath, swelling of the ankles, feet, or hands, sudden weight gain, unusual weakness or fatigue Increase in blood pressure Infection--fever, chills, cough, sore throat, wounds that don't heal, pain or trouble when passing urine, general feeling of discomfort or being unwell Infusion reactions--chest pain, shortness of breath or trouble breathing, feeling faint or lightheaded Kidney injury--decrease in the amount of urine, swelling of the ankles, hands, or  feet Stomach pain that is severe, does not go away, or gets worse Stroke--sudden numbness or weakness of the face, arm, or leg, trouble speaking, confusion, trouble walking, loss of balance or coordination, dizziness, severe headache, change in vision Sudden and severe headache, confusion, change in vision, seizures, which may be signs of posterior reversible encephalopathy syndrome (PRES) Side effects that usually do not require medical attention (report to your care team if they continue or are bothersome): Back pain Change in taste Diarrhea Dry skin Increased tears Nosebleed This list may not describe all possible side effects. Call your doctor for medical advice about side effects. You may report side effects to FDA at 1-800-FDA-1088. Where should I keep my medication? This medication is given in a hospital or clinic. It will not be stored at home. NOTE: This sheet is a summary. It may not cover all possible information. If you have questions about this medicine, talk to your doctor, pharmacist, or health care provider.  2023 Elsevier/Gold Standard (2021-12-25 00:00:00)

## 2022-07-01 ENCOUNTER — Other Ambulatory Visit: Payer: Self-pay

## 2022-07-02 ENCOUNTER — Other Ambulatory Visit: Payer: Self-pay

## 2022-07-03 ENCOUNTER — Other Ambulatory Visit (HOSPITAL_COMMUNITY): Payer: Self-pay

## 2022-07-03 ENCOUNTER — Telehealth: Payer: Self-pay | Admitting: Internal Medicine

## 2022-07-03 NOTE — Telephone Encounter (Signed)
Called patient regarding upcoming November and December appointments, patient is notified.

## 2022-07-04 ENCOUNTER — Other Ambulatory Visit (HOSPITAL_COMMUNITY): Payer: Self-pay

## 2022-07-05 ENCOUNTER — Other Ambulatory Visit (HOSPITAL_BASED_OUTPATIENT_CLINIC_OR_DEPARTMENT_OTHER): Payer: Self-pay

## 2022-07-05 MED ORDER — COMIRNATY 30 MCG/0.3ML IM SUSY
PREFILLED_SYRINGE | INTRAMUSCULAR | 0 refills | Status: DC
  Filled 2022-07-05: qty 0.3, 1d supply, fill #0

## 2022-07-08 ENCOUNTER — Other Ambulatory Visit (HOSPITAL_COMMUNITY): Payer: Self-pay | Admitting: Interventional Radiology

## 2022-07-08 DIAGNOSIS — C3431 Malignant neoplasm of lower lobe, right bronchus or lung: Secondary | ICD-10-CM

## 2022-07-10 ENCOUNTER — Other Ambulatory Visit (HOSPITAL_COMMUNITY): Payer: Self-pay

## 2022-07-12 NOTE — Progress Notes (Signed)
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   PRIOR THERAPY:  1) Status post right Pleurx catheter placement for drainage of recurrent right pleural effusion. 2) repeat biopsy on August 11, 2018 of the lung mass at [facility] and it showed atypical spindle cell proliferation. 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 4) restaging CT scan of the chest on September 14, 2019 showed new 1.5 cm soft tissue lesion along the right subpulmonic diaphragmatic pleural surface worrisome for tumor recurrence.  She was followed by observation by Dr. Angelina Ok at [facility] 5) SBRT to the right lower lobe/diaphragmatic lesion completed June 22, 2020. 6) restaging scan on November 30, 2020 showed new right posterior pleural-based nodule followed by observation.  But repeat CT scan of the chest on 02/07/2021 showed enlarging right pleural mass/nodules. 7) the patient is started treatment with pazopanib on 02/17/2021 400 mg titrated to 800 mg p.o. daily but her treatment was held between July 8 to March 05, 2021 secondary to fatigue and arthralgia.  She resumed her treatment at a dose of 400 mg p.o. daily for 3 days titrated to 600 mg p.o. daily but this was again held in mid August 2022.  She resumed her treatment on 04/23/2021 at 400 mg p.o. daily but this was discontinued in October 2022 secondary to disease progression was a scan on 05/30/2021 revealing progressive multifocal pleural-based disease.  CURRENT THERAPY: Temodar 150 Mg/M2 daily for days 1-7 and 15-21  every 4 weeks in addition to a Avastin 5 Mg/KG on days 8 and 22 every 4 weeks.  Started October 15, 2021.  Starting from cycle #3 her dose of Temodar was reduced to 140 mg once daily on days 1-7 and 15-21 every 4 weeks.  Status post 7 cycles. Starting from cycle #4, the patient opted to undergo temodar for days 1-7 and avastin on day 8 once every 4 weeks starting on 03/25/22. Starting from cycle #9, the patient will restart temodar days 1-5 every 4 weeks and avastin on days 8 and 22 every 4 week due to disease progression. Will discuss with Dr. Julien Nordmann twice monthly dosing of temodar.   INTERVAL HISTORY: Brenda Patel 61 y.o. female returns to the clinic today for a follow-up visit.  The patient recently was seen by Dr. Angelina Ok at [facility].  She had repeat imaging studies that showed disease progression with multiple right sided pleural-based metastases increased in size.  He feels that this is related to the altered dose scheduling that she has been on since August 2023 of Temodar days 1 through 7 and Avastin day 8 every 4 weeks.  Dr. Angelina Ok recommended that at a minimum she resume Avastin and also encouraged her to continue twice monthly Temodar dosing days 1 through 5 due to tolerability and platelet count.  The patient is scheduled for Port-A-Cath placement on 12/6.  The patient would like to restart this new dosing schedule after her Port-A-Cath is placed. However, he next avastin infusion would be on 12/11 which is too close to her port-a-cath placement due to concerns with delayed wound healing.  Otherwise since last being seen, the patient denies any changes in her health.  Denies any fever, chills, night sweats, or unexplained weight loss. She denies shortness of breath but has decreased activity tolerance and tires quicker at baseline. Denies any abnormal bleeding except for bruising. Because of her significant thrombocytopenia/bleeding with full dose temodar in the past, she monitors for  bleeding closely.  Denies any chest pain, shortness of breath, cough, or hemoptysis.  Denies any abdominal pain.  She is here today for evaluation and repeat blood work before undergoing day 8 cycle 8.      MEDICAL HISTORY: Past Medical History:  Diagnosis Date   Asthma    Cancer ([place])    Depression    Dyspnea    Pleural effusion on right    Pleural mass     ALLERGIES:  is allergic to cyanoacrylate, other, wound dressing adhesive, codeine, and lentil.  MEDICATIONS:  Current Outpatient Medications  Medication Sig Dispense Refill   Ascorbic Acid (VITAMIN C) 100 MG tablet Take 100 mg by mouth every other day.     Calcium Citrate 333 MG TABS Take 333 mg by mouth daily.     COVID-19 mRNA vaccine 2023-2024 (COMIRNATY) syringe Inject into the muscle. 0.3 mL 0   [employer] 12 HOUR NASAL DECONGESTANT 120 MG 12 hr tablet Take 120 mg by mouth daily.  10   fluticasone (FLONASE) 50 MCG/ACT nasal spray Place 1 spray into both nostrils daily as needed for allergies.  5   meloxicam (MOBIC) 15 MG tablet Take 15 mg by mouth daily as needed for pain.      montelukast (SINGULAIR) 10 MG tablet Take 10 mg by mouth daily after supper.  10   Multiple Vitamins-Iron (MULTIVITAMINS WITH IRON) TABS tablet Take 1 tablet by mouth every other day. 65 mg     ondansetron (ZOFRAN) 8 MG tablet Take 1 tablet (8 mg total) by mouth every 8 (eight) hours as needed for nausea or vomiting. 30 tablet 2   temozolomide (TEMODAR) 140 MG capsule Take 1 capsule (140 mg total) by mouth daily. Take on days 1-7 and days 15-21 of each 28 day cycle. May take on an empty stomach or at bedtime to decrease nausea & vomiting. 14 capsule 3   No current facility-administered medications for this visit.    SURGICAL HISTORY:  Past Surgical History:  Procedure Laterality Date   CHEST TUBE INSERTION Right 07/16/2018   Procedure: INSERTION PLEURAL DRAINAGE CATHETER, right;  Surgeon: Grace Isaac, MD;  Location: [facility];  Service: Thoracic;   Laterality: Right;   COLONOSCOPY  2017   GUM SURGERY  1984   GRAFTS   IR IMAGING GUIDED PORT INSERTION  10/02/2021   IR RADIOLOGIST EVAL & MGMT  10/08/2021   IR RADIOLOGIST EVAL & MGMT  10/11/2021   IR RADIOLOGIST EVAL & MGMT  04/17/2022   IR REMOVAL TUN ACCESS W/ PORT W/O FL MOD SED  10/15/2021   IR THORACENTESIS ASP PLEURAL SPACE W/IMG GUIDE  07/03/2018    REVIEW OF SYSTEMS:   Review of Systems  Constitutional: Negative for appetite change, chills, fatigue, fever and unexpected weight change.  HENT: Negative for mouth sores, nosebleeds, sore throat and trouble swallowing.   Eyes: Negative for eye problems and icterus.  Respiratory: Negative for cough, hemoptysis, shortness of breath and wheezing.   Cardiovascular: Negative for chest pain and leg swelling.  Gastrointestinal: Negative for abdominal pain, constipation, diarrhea, nausea and vomiting.  Genitourinary: Negative for bladder incontinence, difficulty  urinating, dysuria, frequency and hematuria.   Musculoskeletal: Negative for back pain, gait problem, neck pain and neck stiffness.  Skin: Negative for itching and rash.  Neurological: Negative for dizziness, extremity weakness, gait problem, headaches, light-headedness and seizures.  Hematological: Negative for adenopathy. Does not bleed easily. Positive for easy bruising.  Psychiatric/Behavioral: Negative for confusion, depression and sleep disturbance. The patient is not nervous/anxious.     PHYSICAL EXAMINATION:  Blood pressure 135/79, pulse (!) 107, temperature 98 F (36.7 C), temperature source Oral, resp. rate 15, weight 156 lb 8 oz (71 kg), SpO2 99 %.  ECOG PERFORMANCE STATUS: 1  Physical Exam  Constitutional: Oriented to person, place, and time and well-developed, well-nourished, and in no distress.  HENT:  Head: Normocephalic and atraumatic.  Mouth/Throat: Oropharynx is clear and moist. No oropharyngeal exudate.  Eyes: Conjunctivae are normal. Right eye exhibits no  discharge. Left eye exhibits no discharge. No scleral icterus.  Neck: Normal range of motion. Neck supple.  Cardiovascular: Normal rate, regular rhythm, normal heart sounds and intact distal pulses.   Pulmonary/Chest: Effort normal. Decreased breath sounds right lung. No respiratory distress. No wheezes. No rales.  Abdominal: Soft. Bowel sounds are normal. Exhibits no distension and no mass. There is no tenderness.  Musculoskeletal: Normal range of motion. Exhibits no edema.  Lymphadenopathy:    No cervical adenopathy.  Neurological: Alert and oriented to person, place, and time. Exhibits normal muscle tone. Gait normal. Coordination normal.  Skin: Bruise on left hand. Skin is warm and dry. No rash noted. Not diaphoretic. No erythema. No pallor.  Psychiatric: Mood, memory and judgment normal.  Vitals reviewed.    LABORATORY DATA: Lab Results  Component Value Date   WBC 5.8 07/22/2022   HGB 13.6 07/22/2022   HCT 40.0 07/22/2022   MCV 96.2 07/22/2022   PLT 151 07/22/2022      Chemistry      Component Value Date/Time   NA 140 07/22/2022 1257   K 3.9 07/22/2022 1257   CL 104 07/22/2022 1257   CO2 29 07/22/2022 1257   BUN 17 07/22/2022 1257   CREATININE 0.88 07/22/2022 1257      Component Value Date/Time   CALCIUM 10.4 (H) 07/22/2022 1257   ALKPHOS 90 07/22/2022 1257   AST 24 07/22/2022 1257   ALT 18 07/22/2022 1257   BILITOT 0.5 07/22/2022 1257       RADIOGRAPHIC STUDIES:  No results found.   ASSESSMENT/PLAN:  This is a very pleasant 61 years old Caucasian female presented with large right lower lobe lung mass in addition to right pleural effusion.  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 The patient was referred to Dr. Angelina Ok at [facility] for second opinion and she underwent several studies and intervention as listed below   1) repeat biopsy on August 11, 2018 of the lung mass at [facility] and it showed atypical spindle cell proliferation. 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 3) restaging CT scan of the chest on September 14, 2019 showed new 1.5 cm soft tissue lesion along the right subpulmonic diaphragmatic pleural surface worrisome for tumor recurrence.  She was followed by observation by Dr. Angelina Ok at [facility] 4) SBRT to the right lower lobe/diaphragmatic lesion completed June 22, 2020. 5)  restaging scan on November 30, 2020 showed new right posterior pleural-based nodule followed by observation.  But repeat CT scan of the chest on 02/07/2021 showed enlarging right pleural mass/nodules. 6) the patient is started treatment with pazopanib on 02/17/2021 400 mg titrated to 800 mg p.o. daily but her treatment was held between July 8 to March 05, 2021 secondary to fatigue and arthralgia.  She resumed her treatment at a dose of 400 mg p.o. daily for 3 days titrated to 600 mg p.o. daily but this was again held in mid August 2022.  She resumed her treatment on 04/23/2021 at 400 mg p.o. daily but this was discontinued in October 2022 secondary to disease progression was a scan on 05/30/2021 revealing progressive multifocal pleural-based disease.. There was also a scan on 09/20/21 which showed progressive multifocal pleural based disease.    Dr. Angelina Ok recommended she consider treatment with Avastin and Temodar. She requested delaying starting treatment until after the holidays. She is on Avastin 5 mg/kg milligrams on days 8 and 22 every 4 weeks with Temodar p.o on days 1-7 and 15-21 every 4 weeks. Her Temodar had been on hold for several months due to significant thrombocytopenia requiring numerous platelet transfusions.   In June 2023, her lab work improved.  Her  Temodar was resumed at a 50% dose reduction.  The patient is currently taking 140 milligrams days 1-7 only and Avastin only on day 8 every 4 weeks. Thus far, she has been tolerating this well.  She is here for day 8 cycle 7 today.  Unfortunately, November 2023 the patient evidence of disease progression with imaging performed with Dr. Angelina Ok at [facility].  Dr. Angelina Ok, at a minimum, recommended avastin days 8 and 22 every 4 weeks. Dr. Angelina Ok recommended considering restarting her Temodar days 1 through 5 and days 15 through 19 and Avastin on days 8 and 22.  The patient is opting to start this after she has her Port-A-Cath placed which is scheduled for 07/31/2022.    We will start her new dosing with cycle #9, since she is scheduled for her Port-A-Cath placement on 12/6 and due to the effects of Avastin causing delayed wound healing.  I will discuss this with Dr. Julien Nordmann upon his return to the clinic to adjust her care plan starting from cycle #9.  I will also discussed with him to decision about resuming her Temodar days 1 through 5 as opposed to days 1 through 7 possibly having this to be bimonthly.  The patient has a complicated history related to Port-A-Cath.  She previously has significant allergy due to her prior Port-A-Cath.  She has had an appointment with Dr. Dwaine Gale from interventional radiology who is aware of her history.  They are going to use a different brand of Port-A-Cath.  She specifically scheduled her Port-A-Cath with him on 12/6.  Generally, we would like the patient to wait 7 to 10 days after Avastin before having the procedure.  This will be approximately 9 days.  Okay for her to receive her Port-A-Cath as scheduled on 12/6.  We will see her back for follow-up visit in 4 weeks for evaluation repeat blood work before undergoing day 8 cycle 9.  Dr. Angelina Ok also recommends repeat/restaging CT scan for new baseline if she cannot start her new dosing within 1 month of her last CT scan  which was on 11/8.  Will arrange for a repeat CT scan of the chest around 12/15.  The patient was advised to  call immediately if she has any concerning symptoms in the interval. The patient voices understanding of current disease status and treatment options and is in agreement with the current care plan. All questions were answered. The patient knows to call the clinic with any problems, questions or concerns. We can certainly see the patient much sooner if necessary      Orders Placed This Encounter  Procedures   CT Chest W Contrast    Standing Status:   Future    Standing Expiration Date:   07/22/2023    Order Specific Question:   If indicated for the ordered procedure, I authorize the administration of contrast media per Radiology protocol    Answer:   Yes    Order Specific Question:   Does the patient have a contrast media/X-ray dye allergy?    Answer:   No    Order Specific Question:   Is patient pregnant?    Answer:   No    Order Specific Question:   Preferred imaging location?    Answer:   [facility]    The total time spent in the appointment was 20-29 minutes  [place], PA-C 07/22/22

## 2022-07-22 ENCOUNTER — Inpatient Hospital Stay: Payer: BC Managed Care – PPO

## 2022-07-22 ENCOUNTER — Other Ambulatory Visit: Payer: BC Managed Care – PPO

## 2022-07-22 ENCOUNTER — Inpatient Hospital Stay: Payer: BC Managed Care – PPO | Attending: Internal Medicine

## 2022-07-22 ENCOUNTER — Other Ambulatory Visit: Payer: Self-pay

## 2022-07-22 ENCOUNTER — Ambulatory Visit: Payer: BC Managed Care – PPO

## 2022-07-22 ENCOUNTER — Inpatient Hospital Stay (HOSPITAL_BASED_OUTPATIENT_CLINIC_OR_DEPARTMENT_OTHER): Payer: BC Managed Care – PPO | Admitting: Physician Assistant

## 2022-07-22 ENCOUNTER — Ambulatory Visit: Payer: BC Managed Care – PPO | Admitting: Internal Medicine

## 2022-07-22 VITALS — BP 135/79 | HR 107 | Temp 98.0°F | Resp 15 | Wt 156.5 lb

## 2022-07-22 VITALS — HR 97

## 2022-07-22 DIAGNOSIS — C3491 Malignant neoplasm of unspecified part of right bronchus or lung: Secondary | ICD-10-CM

## 2022-07-22 DIAGNOSIS — Z5112 Encounter for antineoplastic immunotherapy: Secondary | ICD-10-CM | POA: Diagnosis present

## 2022-07-22 DIAGNOSIS — C3431 Malignant neoplasm of lower lobe, right bronchus or lung: Secondary | ICD-10-CM | POA: Diagnosis not present

## 2022-07-22 DIAGNOSIS — Z79630 Long term (current) use of alkylating agent: Secondary | ICD-10-CM | POA: Insufficient documentation

## 2022-07-22 DIAGNOSIS — Z79899 Other long term (current) drug therapy: Secondary | ICD-10-CM | POA: Diagnosis not present

## 2022-07-22 DIAGNOSIS — J45909 Unspecified asthma, uncomplicated: Secondary | ICD-10-CM | POA: Insufficient documentation

## 2022-07-22 DIAGNOSIS — R5383 Other fatigue: Secondary | ICD-10-CM | POA: Diagnosis not present

## 2022-07-22 DIAGNOSIS — Z885 Allergy status to narcotic agent status: Secondary | ICD-10-CM | POA: Insufficient documentation

## 2022-07-22 DIAGNOSIS — D492 Neoplasm of unspecified behavior of bone, soft tissue, and skin: Secondary | ICD-10-CM

## 2022-07-22 DIAGNOSIS — Z5111 Encounter for antineoplastic chemotherapy: Secondary | ICD-10-CM | POA: Diagnosis not present

## 2022-07-22 DIAGNOSIS — J90 Pleural effusion, not elsewhere classified: Secondary | ICD-10-CM | POA: Insufficient documentation

## 2022-07-22 DIAGNOSIS — M2550 Pain in unspecified joint: Secondary | ICD-10-CM | POA: Diagnosis not present

## 2022-07-22 LAB — CMP (CANCER CENTER ONLY)
ALT: 18 U/L (ref 0–44)
AST: 24 U/L (ref 15–41)
Albumin: 4.7 g/dL (ref 3.5–5.0)
Alkaline Phosphatase: 90 U/L (ref 38–126)
Anion gap: 7 (ref 5–15)
BUN: 17 mg/dL (ref 6–20)
CO2: 29 mmol/L (ref 22–32)
Calcium: 10.4 mg/dL — ABNORMAL HIGH (ref 8.9–10.3)
Chloride: 104 mmol/L (ref 98–111)
Creatinine: 0.88 mg/dL (ref 0.44–1.00)
GFR, Estimated: >60 mL/min (ref >60)
Glucose, Bld: 125 mg/dL — ABNORMAL HIGH (ref 70–99)
Potassium: 3.9 mmol/L (ref 3.5–5.1)
Sodium: 140 mmol/L (ref 135–145)
Total Bilirubin: 0.5 mg/dL (ref 0.3–1.2)
Total Protein: 7.8 g/dL (ref 6.5–8.1)

## 2022-07-22 LAB — CBC WITH DIFFERENTIAL (CANCER CENTER ONLY)
Abs Immature Granulocytes: 0.01 10*3/uL (ref 0.00–0.07)
Basophils Absolute: 0 10*3/uL (ref 0.0–0.1)
Basophils Relative: 1 %
Eosinophils Absolute: 0.1 10*3/uL (ref 0.0–0.5)
Eosinophils Relative: 2 %
HCT: 40 % (ref 36.0–46.0)
Hemoglobin: 13.6 g/dL (ref 12.0–15.0)
Immature Granulocytes: 0 %
Lymphocytes Relative: 13 %
Lymphs Abs: 0.8 10*3/uL (ref 0.7–4.0)
MCH: 32.7 pg (ref 26.0–34.0)
MCHC: 34 g/dL (ref 30.0–36.0)
MCV: 96.2 fL (ref 80.0–100.0)
Monocytes Absolute: 0.4 10*3/uL (ref 0.1–1.0)
Monocytes Relative: 6 %
Neutro Abs: 4.5 10*3/uL (ref 1.7–7.7)
Neutrophils Relative %: 78 %
Platelet Count: 151 10*3/uL (ref 150–400)
RBC: 4.16 MIL/uL (ref 3.87–5.11)
RDW: 13.5 % (ref 11.5–15.5)
WBC Count: 5.8 10*3/uL (ref 4.0–10.5)
nRBC: 0 % (ref 0.0–0.2)

## 2022-07-22 LAB — TOTAL PROTEIN, URINE DIPSTICK: Protein, ur: NEGATIVE mg/dL

## 2022-07-22 MED ORDER — SODIUM CHLORIDE 0.9 % IV SOLN: Freq: Once | INTRAVENOUS | Status: AC

## 2022-07-22 MED ORDER — SODIUM CHLORIDE 0.9 % IV SOLN
5.0000 mg/kg | Freq: Once | INTRAVENOUS | Status: AC
  Administered 2022-07-22: 350 mg via INTRAVENOUS
  Filled 2022-07-22: qty 14

## 2022-07-22 NOTE — Patient Instructions (Addendum)
[facility]COL[facility]  Discharge Instructions: Thank you for choosing [facility] to provide your oncology and hematology care.   If you have a lab appointment with the [facility], please go directly to the [facility] and check in at the registration area.   Wear comfortable clothing and clothing appropriate for easy access to any Portacath or PICC line.   We strive to give you quality time with your provider. You may need to reschedule your appointment if you arrive late (15 or more minutes).  Arriving late affects you and other patients whose appointments are after yours.  Also, if you miss three or more appointments without notifying the office, you may be dismissed from the clinic at the provider's discretion.      For prescription refill requests, have your pharmacy contact our office and allow 72 hours for refills to be completed.    Today you received the following chemotherapy and/or immunotherapy agents bevacizumab      To help prevent nausea and vomiting after your treatment, we encourage you to take your nausea medication as directed.  BELOW ARE SYMPTOMS THAT SHOULD BE REPORTED IMMEDIATELY: *FEVER GREATER THAN 100.4 F (38 C) OR HIGHER *CHILLS OR SWEATING *NAUSEA AND VOMITING THAT IS NOT CONTROLLED WITH YOUR NAUSEA MEDICATION *UNUSUAL SHORTNESS OF BREATH *UNUSUAL BRUISING OR BLEEDING *URINARY PROBLEMS (pain or burning when urinating, or frequent urination) *BOWEL PROBLEMS (unusual diarrhea, constipation, pain near the anus) TENDERNESS IN MOUTH AND THROAT WITH OR WITHOUT PRESENCE OF ULCERS (sore throat, sores in mouth, or a toothache) UNUSUAL RASH, SWELLING OR PAIN  UNUSUAL VAGINAL DISCHARGE OR ITCHING   Items with * indicate a potential emergency and should be followed up as soon as possible or go to the Emergency Department if any problems should occur.  Please show the CHEMOTHERAPY ALERT CARD or IMMUNOTHERAPY ALERT CARD at check-in to  the Emergency Department and triage nurse.  Should you have questions after your visit or need to cancel or reschedule your appointment, please contact [facility]  Dept: [phone]  and follow the prompts.  Office hours are 8:00 a.m. to 4:30 p.m. Monday - Friday. Please note that voicemails left after 4:00 p.m. may not be returned until the following business day.  We are closed weekends and major holidays. You have access to a nurse at all times for urgent questions. Please call the main number to the clinic Dept: [phone] and follow the prompts.   For any non-urgent questions, you may also contact your provider using MyChart. We now offer e-Visits for anyone 18 and older to request care online for non-urgent symptoms. For details visit [email or web address].   Also download the MyChart app! Go to the app store, search "MyChart", open the app, select [facility], and log in with your MyChart username and password.  Masks are optional in the cancer centers. If you would like for your care team to wear a mask while they are taking care of you, please let them know. You may have one support person who is at least 59 years old accompany you for your appointments.

## 2022-07-23 ENCOUNTER — Telehealth: Payer: Self-pay | Admitting: Physician Assistant

## 2022-07-23 NOTE — Telephone Encounter (Signed)
Scheduled appointment per 11/27 los. Left voicemail.

## 2022-07-24 ENCOUNTER — Other Ambulatory Visit: Payer: Self-pay

## 2022-07-25 ENCOUNTER — Other Ambulatory Visit: Payer: Self-pay

## 2022-07-30 ENCOUNTER — Other Ambulatory Visit (HOSPITAL_COMMUNITY): Payer: Self-pay | Admitting: Physician Assistant

## 2022-07-31 ENCOUNTER — Other Ambulatory Visit: Payer: Self-pay

## 2022-07-31 ENCOUNTER — Encounter (HOSPITAL_COMMUNITY): Payer: Self-pay

## 2022-07-31 ENCOUNTER — Ambulatory Visit (HOSPITAL_COMMUNITY)
Admission: RE | Admit: 2022-07-31 | Discharge: 2022-07-31 | Disposition: A | Discharge status: Home / Self Care | Payer: BC Managed Care – PPO | Source: Ambulatory Visit | Attending: Interventional Radiology | Admitting: Interventional Radiology

## 2022-07-31 DIAGNOSIS — C3431 Malignant neoplasm of lower lobe, right bronchus or lung: Secondary | ICD-10-CM | POA: Insufficient documentation

## 2022-07-31 HISTORY — PX: IR IMAGING GUIDED PORT INSERTION: IMG5740

## 2022-07-31 MED ORDER — LIDOCAINE-EPINEPHRINE 1 %-1:100000 IJ SOLN
INTRAMUSCULAR | Status: AC
  Administered 2022-07-31: 20 mL
  Filled 2022-07-31: qty 1

## 2022-07-31 MED ORDER — HEPARIN SOD (PORK) LOCK FLUSH 100 UNIT/ML IV SOLN
INTRAVENOUS | Status: AC
  Administered 2022-07-31: 500 [IU]
  Filled 2022-07-31: qty 5

## 2022-07-31 MED ORDER — MIDAZOLAM HCL 2 MG/2ML IJ SOLN
INTRAMUSCULAR | Status: AC
  Filled 2022-07-31: qty 2

## 2022-07-31 MED ORDER — SODIUM CHLORIDE 0.9 % IV SOLN
INTRAVENOUS | Status: DC
  Administered 2022-07-31: 10 mL/h via INTRAVENOUS

## 2022-07-31 MED ORDER — FENTANYL CITRATE (PF) 100 MCG/2ML IJ SOLN
INTRAMUSCULAR | Status: AC
  Filled 2022-07-31: qty 2

## 2022-07-31 MED ORDER — MIDAZOLAM HCL 2 MG/2ML IJ SOLN
INTRAMUSCULAR | Status: AC | PRN
  Administered 2022-07-31: 1 mg via INTRAVENOUS

## 2022-07-31 MED ORDER — FENTANYL CITRATE (PF) 100 MCG/2ML IJ SOLN
INTRAMUSCULAR | Status: AC | PRN
  Administered 2022-07-31: 50 ug via INTRAVENOUS

## 2022-07-31 NOTE — H&P (Signed)
Chief Complaint: Patient was seen in consultation today for port a cath placement at the request of Dr Mayme Genta  Supervising Physician: Mir, Sharen Heck  Patient Status: [facility] - Out-pt  History of Present Illness: Brenda Patel is a 61 y.o. female   Dx Sarcoma/ Rt lung 2019 PAC was placed in IR 10/02/21 Developed allergic reaction to original PORT and removed 10/15/21  Now scheduled for new Port a cath placement; ongoing treatment Follows with [facility] Non allergic ClearVue Slim Port from [place] -- Per Dr Mir   Past Medical History:  Diagnosis Date   Asthma    Cancer ([place])    Depression    Dyspnea    Pleural effusion on right    Pleural mass     Past Surgical History:  Procedure Laterality Date   CHEST TUBE INSERTION Right 07/16/2018   Procedure: INSERTION PLEURAL DRAINAGE CATHETER, right;  Surgeon: Grace Isaac, MD;  Location: [facility];  Service: Thoracic;  Laterality: Right;   COLONOSCOPY  2017   [facility]   IR IMAGING GUIDED PORT INSERTION  10/02/2021   IR RADIOLOGIST EVAL & MGMT  10/08/2021   IR RADIOLOGIST EVAL & MGMT  10/11/2021   IR RADIOLOGIST EVAL & MGMT  04/17/2022   IR REMOVAL TUN ACCESS W/ PORT W/O FL MOD SED  10/15/2021   IR THORACENTESIS ASP PLEURAL SPACE W/IMG GUIDE  07/03/2018    Allergies: Cyanoacrylate, Other, Wound dressing adhesive, Codeine, and Lentil  Medications: Prior to Admission medications   Medication Sig Start Date End Date Taking? Authorizing Provider  Calcium Citrate 333 MG TABS Take 333 mg by mouth daily.   Yes [provider]  fluticasone (FLONASE) 50 MCG/ACT nasal spray Place 1 spray into both nostrils daily as needed for allergies. 05/05/18  Yes [provider]  IRON-VITAMIN C PO Take 1 tablet by mouth every other day.   Yes [provider]  meloxicam (MOBIC) 15 MG tablet Take 15 mg by mouth daily as needed for pain.  10/13/19  Yes [provider]  montelukast (SINGULAIR) 10  MG tablet Take 10 mg by mouth daily after supper. 06/07/18  Yes [provider]  Olopatadine HCl (PATADAY OP) Place 1 drop into both eyes daily as needed (allergies).   Yes [provider]  ondansetron (ZOFRAN) 8 MG tablet Take 1 tablet (8 mg total) by mouth every 8 (eight) hours as needed for nausea or vomiting. Patient taking differently: Take 8 mg by mouth See admin instructions. Take 8 mg 1 hour before taking Temodar, 7 days a month 03/11/22  Yes Heilingoetter, Cassandra L, PA-C  pseudoephedrine (SUDAFED) 120 MG 12 hr tablet Take 120 mg by mouth See admin instructions. Take 120 mg in the morning, may take a second 120 mg dose as needed for allergies   Yes [provider]  temozolomide (TEMODAR) 140 MG capsule Take 1 capsule (140 mg total) by mouth daily. Take on days 1-7 and days 15-21 of each 28 day cycle. May take on an empty stomach or at bedtime to decrease nausea & vomiting. Patient taking differently: Take 140 mg by mouth See admin instructions. Take on days 1-7, no doses on days 8-28, repeat 28 day cycle. May take on an empty stomach or at bedtime to decrease nausea & vomiting. 02/25/22  Yes Curt Bears, MD  COVID-19 mRNA vaccine [phone] (COMIRNATY) syringe Inject into the muscle. 07/05/22        History reviewed.  No pertinent family history.  Social History   Socioeconomic History   Marital status: Divorced    Spouse name: Not on file   Number of children: Not on file   Years of education: Not on file   Highest education level: Not on file  Occupational History   Not on file  Tobacco Use   Smoking status: Never   Smokeless tobacco: Never  Vaping Use   Vaping Use: Never used  Substance and Sexual Activity   Alcohol use: Yes    Comment: 3-4 glasses of wine per month/liquor 1x monthly   Drug use: Never   Sexual activity: Not on file  Other Topics Concern   Not on file  Social History Narrative   Not on file   Social Determinants of Health    Financial Resource Strain: Not on file  Food Insecurity: Not on file  Transportation Needs: Not on file  Physical Activity: Unknown (07/15/2018)   Exercise Vital Sign    Days of Exercise per Week: 5 days    Minutes of Exercise per Session: Not on file  Stress: Not on file  Social Connections: Not on file    Review of Systems: A 12 point ROS discussed and pertinent positives are indicated in the HPI above.  All other systems are negative.  Review of Systems  Constitutional:  Negative for activity change.  Respiratory:  Negative for cough and shortness of breath.   Cardiovascular:  Negative for chest pain.  Gastrointestinal:  Negative for abdominal pain.  Psychiatric/Behavioral:  Negative for behavioral problems and confusion.     Vital Signs: BP (!) 154/94   Pulse (!) 104   Temp (!) 97.4 F (36.3 C) (Temporal)   Resp 16   Ht 5\' 6"  (1.676 m)   Wt 153 lb (69.4 kg)   SpO2 100%   BMI 24.69 kg/m     Physical Exam Vitals reviewed.  HENT:     Mouth/Throat:     Mouth: Mucous membranes are moist.  Cardiovascular:     Rate and Rhythm: Normal rate and regular rhythm.     Heart sounds: Normal heart sounds.  Pulmonary:     Effort: Pulmonary effort is normal.     Breath sounds: Normal breath sounds.  Abdominal:     Palpations: Abdomen is soft.  Musculoskeletal:        General: Normal range of motion.  Skin:    General: Skin is warm.  Neurological:     Mental Status: She is alert and oriented to person, place, and time.  Psychiatric:        Behavior: Behavior normal.     Imaging: No results found.  Labs:  CBC: Recent Labs    04/30/22 1354 05/27/22 1045 06/24/22 0929 07/22/22 1257  WBC 4.9 5.2 4.4 5.8  HGB 12.2 12.8 12.7 13.6  HCT 36.5 38.7 37.9 40.0  PLT 144* 144* 129* 151    COAGS: Recent Labs    12/14/21 1101 12/19/21 1704  INR 0.9 1.0  APTT 24  --     BMP: Recent Labs    04/30/22 1354 05/27/22 1045 06/24/22 0929 07/22/22 1257  NA 138  140 140 140  K 3.7 4.0 3.9 3.9  CL 101 104 104 104  CO2 31 30 29 29   GLUCOSE 89 95 91 125*  BUN 17 13 16 17   CALCIUM 10.2 9.6 9.4 10.4*  CREATININE 0.86 0.75 0.78 0.88  GFRNONAA >60 >60 >60 >60    LIVER FUNCTION TESTS:  Recent Labs    04/30/22 1354 05/27/22 1045 06/24/22 0929 07/22/22 1257  BILITOT 0.3 0.4 0.5 0.5  AST 25 25 24 24   ALT 16 18 16 18   ALKPHOS 88 85 84 90  PROT 7.8 7.1 7.4 7.8  ALBUMIN 4.7 4.4 4.2 4.7    TUMOR MARKERS: No results for input(s): "AFPTM", "CEA", "CA199", "CHROMGRNA" in the last 8760 hours.  Assessment and Plan:  Sarcoma Dx Follows with [facility] Ongoing treatment Original PAC placed in IR 10/02/21---- found to be allergic to [place] removed 13 days later Now for new PAC placement -- ClearVue Slim Port from [facility] Dr Mir to place [facility] today Risks and benefits of image guided port-a-catheter placement was discussed with the patient including, but not limited to bleeding, infection, pneumothorax, or fibrin sheath development and need for additional procedures.  All of the patient's questions were answered, patient is agreeable to proceed. Consent signed and in chart.  Thank you for this interesting consult.  I greatly enjoyed meeting Brenda Patel and look forward to participating in their care.  A copy of this report was sent to the requesting provider on this date.  Electronically Signed: Lavonia Drafts, PA-C 07/31/2022, 10:35 AM   I spent a total of    25 Minutes in face to face in clinical consultation, greater than 50% of which was counseling/coordinating care for [facility] placement

## 2022-07-31 NOTE — Procedures (Signed)
Interventional Radiology Procedure Note  Procedure: Chest port  Indication: Lung malignancy  Findings: Please refer to procedural dictation for full description.  Complications: None  EBL: < 10 mL  Miachel Roux, MD [phone]

## 2022-08-01 ENCOUNTER — Other Ambulatory Visit: Payer: Self-pay

## 2022-08-05 ENCOUNTER — Other Ambulatory Visit (HOSPITAL_COMMUNITY): Payer: Self-pay

## 2022-08-08 ENCOUNTER — Other Ambulatory Visit (HOSPITAL_COMMUNITY): Payer: Self-pay

## 2022-08-13 ENCOUNTER — Other Ambulatory Visit: Payer: Self-pay | Admitting: Internal Medicine

## 2022-08-13 DIAGNOSIS — D492 Neoplasm of unspecified behavior of bone, soft tissue, and skin: Secondary | ICD-10-CM

## 2022-08-14 ENCOUNTER — Ambulatory Visit (HOSPITAL_COMMUNITY)
Admission: RE | Admit: 2022-08-14 | Discharge: 2022-08-14 | Disposition: A | Discharge status: Home / Self Care | Payer: BC Managed Care – PPO | Source: Ambulatory Visit | Attending: Physician Assistant | Admitting: Physician Assistant

## 2022-08-14 DIAGNOSIS — D492 Neoplasm of unspecified behavior of bone, soft tissue, and skin: Secondary | ICD-10-CM | POA: Insufficient documentation

## 2022-08-14 MED ORDER — IOHEXOL 300 MG/ML  SOLN
100.0000 mL | Freq: Once | INTRAMUSCULAR | Status: AC | PRN
  Administered 2022-08-14: 100 mL via INTRAVENOUS

## 2022-08-14 MED ORDER — HEPARIN SOD (PORK) LOCK FLUSH 100 UNIT/ML IV SOLN
INTRAVENOUS | Status: AC
  Administered 2022-08-14: 500 [IU]
  Filled 2022-08-14: qty 5

## 2022-08-17 NOTE — Progress Notes (Unsigned)
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    PRIOR THERAPY: 1) Status post right Pleurx catheter placement for drainage of recurrent right pleural effusion. 2) repeat biopsy on August 11, 2018 of the lung mass at [facility] and it showed atypical spindle cell proliferation. 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 4) restaging CT scan of the chest on September 14, 2019 showed new 1.5 cm soft tissue lesion along the right subpulmonic diaphragmatic pleural surface worrisome for tumor recurrence.  She was followed by observation by Dr. Angelina Ok at [place] [facility] 5) SBRT to the right lower lobe/diaphragmatic lesion completed June 22, 2020. 6) restaging scan on November 30, 2020 showed new right posterior pleural-based nodule followed by observation.  But repeat CT scan of the chest on 02/07/2021 showed enlarging right pleural mass/nodules. 7) the patient is started treatment with pazopanib on 02/17/2021 400 mg titrated to 800 mg p.o. daily but her treatment was held between July 8 to March 05, 2021 secondary to fatigue and arthralgia.  She resumed her treatment at a dose of 400 mg p.o. daily for 3 days titrated to 600 mg p.o. daily but this was again held in mid August 2022.  She resumed her treatment on 04/23/2021 at 400 mg p.o. daily but this was discontinued in October 2022 secondary to disease progression was a scan on 05/30/2021 revealing progressive multifocal pleural-based disease.  CURRENT THERAPY: Temodar 150 Mg/M2 daily for days 1-7 and 15-21  every 4 weeks in addition to a Avastin 5 Mg/KG on days 8 and 22 every 4 weeks.  Started October 15, 2021.  Starting from cycle #3 her dose of Temodar was reduced to 140 mg once daily on days 1-7 and 15-21 every 4 weeks.  Status post 7 cycles. Starting from cycle #4, the patient opted to undergo temodar for days 1-7 and avastin on day 8 once every 4 weeks starting on 03/25/22. Starting from cycle #9, the patient will restart temodar days 1-7 every 4 weeks and avastin on days 8 and 22 every 4 week due to disease progression.   INTERVAL HISTORY: Brenda Patel 61 y.o. female returns to the clinic today for a follow-up visit.  The patient recently was seen by Dr. Angelina Ok at [facility] in November 2023. She had repeat imaging studies that showed disease progression with multiple right sided pleural-based metastases increased in size. He feels that this is related to the altered dose scheduling that she has been on since August 2023 of Temodar days 1 through 7 and Avastin day 8 every 4 weeks. Dr. Angelina Ok recommended that at a minimum she resume Avastin bimonthly also mentioned could consider twice monthly Temodar dosing days 1 through 5 for tolerability and platelet count. She has a history of significant thrombocytopenia secondary to temodar in the Spring 2023. The patient wished to hold off on the bi-monthly dosing until she can have her port a cath placed which was performed on on 12/11. In the past, she had allergy to a port-a-cath so she had a different port-a-cath brand placed which she thus far has been tolerating it  well without any signs of allergy.   I talked to Dr. Julien Nordmann prior to being out of the office today. We will resume the bimonthly dosing of avastin but will avoid the temodar bimonthly dosing for now due to intolerance in the past. The patient also agrees with this and is hesitant to restart bi-monthly temodar. She is taking temodar days 1-7.   Otherwise since last being seen, the patient  denies any changes in her health. She was called for jury duty and needs assistance with medical clearance for jury duty due to the frequency of appointments, memory fog from treatment, and immunocompromised state. Denies any fever, chills, night sweats, or unexplained weight loss. She denies shortness of breath but has decreased activity tolerance and tires quicker at baseline. Denies any abnormal bleeding except for bruising. Because of her significant thrombocytopenia/bleeding with full dose temodar in the past, she monitors for bleeding closely.  Denies any chest pain, shortness of breath, cough, or hemoptysis.  Denies any abdominal pain.  She had a repeat baseline CT scan. She is here today for evaluation and repeat blood work before undergoing day 9 cycle 8.      MEDICAL HISTORY: Past Medical History:  Diagnosis Date   Asthma    Cancer ([place])    Depression    Dyspnea    Pleural effusion on right    Pleural mass     ALLERGIES:  is allergic to cyanoacrylate, other, wound dressing adhesive, codeine, and lentil.  MEDICATIONS:  Current Outpatient Medications  Medication Sig Dispense Refill   Calcium Citrate 333 MG TABS Take 333 mg by mouth daily.     COVID-19 mRNA vaccine 2023-2024 (COMIRNATY) syringe Inject into the muscle. 0.3 mL 0   fluticasone (FLONASE) 50 MCG/ACT nasal spray Place 1 spray into both nostrils daily as needed for allergies.  5   IRON-VITAMIN C PO Take 1 tablet by mouth every other day.     meloxicam (MOBIC) 15 MG tablet Take 15 mg by mouth daily as needed for pain.      montelukast (SINGULAIR) 10 MG tablet Take 10 mg by mouth daily after supper.  10   Olopatadine HCl (PATADAY OP) Place 1 drop into both eyes daily as needed (allergies).     ondansetron (ZOFRAN) 8 MG tablet Take 1 tablet (8 mg total) by mouth every 8 (eight) hours as needed for nausea or vomiting. (Patient taking differently: Take 8 mg by mouth See admin instructions. Take 8 mg 1 hour before taking  Temodar, 7 days a month) 30 tablet 2   pseudoephedrine (SUDAFED) 120 MG 12 hr tablet Take 120 mg by mouth See admin instructions. Take 120 mg in the morning, may take a second 120 mg dose as needed for allergies     temozolomide (TEMODAR) 140 MG capsule Take 1 capsule (140 mg total) by mouth daily. Take on days 1-7 and days 15-21 of each 28 day cycle. May take on an empty stomach or at bedtime to decrease nausea & vomiting. (Patient taking differently: Take 140 mg by mouth See admin instructions. Take on days 1-7, no doses on days 8-28, repeat 28 day cycle. May take on an empty stomach or at bedtime to decrease nausea & vomiting.) 14 capsule 3   No current facility-administered medications for this visit.    SURGICAL HISTORY:  Past Surgical History:  Procedure Laterality Date   CHEST TUBE INSERTION Right 07/16/2018   Procedure: INSERTION PLEURAL DRAINAGE CATHETER, right;  Surgeon: Grace Isaac, MD;  Location: [facility] OR;  Service: Thoracic;  Laterality: Right;   COLONOSCOPY  2017   GUM SURGERY  1984   GRAFTS   IR IMAGING GUIDED PORT INSERTION  10/02/2021   IR IMAGING GUIDED PORT INSERTION  07/31/2022   IR RADIOLOGIST EVAL & MGMT  10/08/2021   IR RADIOLOGIST EVAL & MGMT  10/11/2021   IR RADIOLOGIST EVAL & MGMT  04/17/2022   IR REMOVAL TUN ACCESS W/ PORT W/O FL MOD SED  10/15/2021   IR THORACENTESIS ASP PLEURAL SPACE W/IMG GUIDE  07/03/2018    REVIEW OF SYSTEMS:   Review of Systems  Constitutional: Negative for appetite change, chills, fatigue, fever and unexpected weight change.  HENT: Negative for mouth sores, nosebleeds, sore throat and trouble swallowing.   Eyes: Negative for eye problems and icterus.  Respiratory: Negative for cough, hemoptysis, shortness of breath and wheezing.   Cardiovascular: Negative for chest pain and leg swelling.  Gastrointestinal: Negative for abdominal pain, constipation, diarrhea, nausea and vomiting.  Genitourinary: Negative for bladder incontinence,  difficulty urinating, dysuria, frequency and hematuria.   Musculoskeletal: Negative for back pain, gait problem, neck pain and neck stiffness.  Skin: Negative for itching and rash.  Neurological: Negative for dizziness, extremity weakness, gait problem, headaches, light-headedness and seizures.  Hematological: Negative for adenopathy. Does not bleed easily. Positive for easy bruising.  Psychiatric/Behavioral: Negative for confusion, depression and sleep disturbance. The patient is not nervous/anxious   PHYSICAL EXAMINATION:  Blood pressure (!) 149/82, pulse (!) 9, temperature 98 F (36.7 C), temperature source Oral, resp. rate 16, height 5\' 6"  (1.676 m), weight 157 lb 6.4 oz (71.4 kg), SpO2 100 %.  ECOG PERFORMANCE STATUS: 1  Physical Exam  Constitutional: Oriented to person, place, and time and well-developed, well-nourished, and in no distress.  HENT:  Head: Normocephalic and atraumatic.  Mouth/Throat: Oropharynx is clear and moist. No oropharyngeal exudate.  Eyes: Conjunctivae are normal. Right eye exhibits no discharge. Left eye exhibits no discharge. No scleral icterus.  Neck: Normal range of motion. Neck supple.  Cardiovascular: Normal rate, regular rhythm, normal heart sounds and intact distal pulses.   Pulmonary/Chest: Effort normal. Decreased breath sounds right lung. No respiratory distress. No wheezes. No rales.  Abdominal: Soft. Bowel sounds are normal. Exhibits no distension and no mass. There is no tenderness.  Musculoskeletal: Normal range of motion. Exhibits no edema.  Lymphadenopathy:    No cervical adenopathy.  Neurological: Alert and oriented to person, place, and time. Exhibits normal muscle tone. Gait normal. Coordination normal.  Skin: Bruise on left hand. Skin is warm and dry. No rash noted. Not diaphoretic. No erythema. No pallor.  Psychiatric: Mood, memory and judgment normal.  Vitals reviewed.  LABORATORY DATA: Lab Results  Component Value Date   WBC 4.5  08/20/2022   HGB 12.1 08/20/2022   HCT 36.5 08/20/2022   MCV 97.3 08/20/2022   PLT 132 (L) 08/20/2022      Chemistry      Component Value Date/Time   NA 140 08/20/2022 1015   K 4.1 08/20/2022 1015   CL 104 08/20/2022 1015   CO2 30 08/20/2022 1015   BUN 15 08/20/2022 1015   CREATININE 0.74 08/20/2022 1015      Component Value Date/Time   CALCIUM 9.6 08/20/2022 1015   ALKPHOS 83 08/20/2022 1015   AST 23 08/20/2022 1015   ALT 15 08/20/2022 1015   BILITOT 0.4 08/20/2022 1015       RADIOGRAPHIC STUDIES:  CT Chest W Contrast  Result Date:  08/18/2022 CLINICAL DATA:  Post resection of sarcoma, follow-up evaluation. * Tracking Code: BO * EXAM: CT CHEST WITH CONTRAST TECHNIQUE: Multidetector CT imaging of the chest was performed during intravenous contrast administration. RADIATION DOSE REDUCTION: This exam was performed according to the departmental dose-optimization program which includes automated exposure control, adjustment of the mA and/or kV according to patient size and/or use of iterative reconstruction technique. CONTRAST:  187mL OMNIPAQUE IOHEXOL 300 MG/ML  SOLN COMPARISON:  Previous chest imaging from November of 2019. FINDINGS: Cardiovascular: RIGHT-sided Port-A-Cath enters via IJ approach terminating at the caval to atrial junction. Heart size is normal without pericardial effusion or sign of pericardial nodularity. Central pulmonary vasculature is unremarkable on venous phase assessment. Mediastinum/Nodes: Posterior mediastinal adenopathy (image 107/2) 16-17 mm short axis. Pleural disease versus adenopathy on image 123/2) 3.5 cm short axis with heterogeneous features and areas of internal hyperenhancement just above the aortic hiatus. Lungs/Pleura: Post partial lung resection in the RIGHT lower lobe. Signs of pleural and parenchymal metastatic disease including chest wall involvement. (Image 116/2) 4.9 x 3.3 cm mass adjacent to resection margins in the medial RIGHT lower chest.  This extends to involve the adjacent under surface of the RIGHT eleventh rib and extends into extrapleural fat in the RIGHT lower chest and involves musculature of the intercostal muscles in the RIGHT lower chest. (Image 134/2) 5.6 x 4.4 cm heterogeneous enhancing mass in the anterior inferior costodiaphragmatic sulcus. This exerts mass effect upon the adjacent liver. Pleural based mass along the RIGHT posterolateral pleural surface in the RIGHT lower chest abutting the major fissure (image 70/2) 4.6 x 2.8 cm. Similar smaller lesion along the lateral margin of the pleural surface abutting the minor fissure in the RIGHT middle lobe on image 82/2 measuring 3.7 cm. Airways are patent. Anterior RIGHT upper lobe pleural based lesion (image 36/2) 2.7 cm abutting the under surface of the RIGHT first rib. LEFT chest is clear. Upper Abdomen: No signs of upper abdominal lymphadenopathy. No acute findings relative to liver, pancreas, spleen and adrenal glands to the extent imaged on today's study. Musculoskeletal: Chest wall involvement as discussed. Lesions along the RIGHT lateral chest also extended the extrapleural fat. No frank bony destruction at this time. Early heterogeneous mildly mottled appearance of the RIGHT first rib underlying RIGHT anterior upper lobe metastatic lesion. IMPRESSION: 1. Signs of pleural based metastatic disease including chest wall involvement as described. 2. Posterior mediastinal nodal disease. 3. Post partial lung resection in the RIGHT lower lobe. Electronically Signed   By: Zetta Bills M.D.   On: 08/18/2022 14:35   IR IMAGING GUIDED PORT INSERTION  Result Date: 08/01/2022 INDICATION: 61 year old woman with history of right lung sarcoma. Right chest port initially placed on 10/02/2021. She developed an allergic reaction shortly after placement of the port which did not resolve despite topical treatments. The port was removed on 10/15/2021. She returns today for placement of low-profile  Clear Vue chest port. EXAM: IMPLANTED PORT A CATH PLACEMENT WITH ULTRASOUND AND FLUOROSCOPIC GUIDANCE MEDICATIONS: None ANESTHESIA/SEDATION: Moderate (conscious) sedation was employed during this procedure. A total of Versed 1 mg and Fentanyl 50 mcg was administered intravenously by the radiology nurse. Total intra-service moderate Sedation Time: 22 minutes. The patient's level of consciousness and vital signs were monitored continuously by radiology nursing throughout the procedure under my direct supervision. FLUOROSCOPY: Radiation Exposure Index (as provided by the fluoroscopic device): 6 mGy Kerma COMPLICATIONS: None immediate. PROCEDURE: The procedure, risks, benefits, and alternatives were explained to the patient. Questions regarding the  procedure were encouraged and answered. The patient understands and consents to the procedure. A timeout was performed prior to the initiation of the procedure. Patient positioned supine on the angiography table. Right neck and anterior upper chest prepped and draped in the usual sterile fashion. All elements of maximal sterile barrier were utilized including, cap, mask, sterile gown, sterile gloves, large sterile drape, hand scrubbing and 2% Chlorhexidine for skin cleaning. The right internal jugular vein was evaluated with ultrasound and shown to be patent. A permanent ultrasound image was obtained and placed in the patient's medical record. Local anesthesia was provided with 1% lidocaine with epinephrine. Using sterile gel and a sterile probe cover, the right internal jugular vein was entered with a 21 ga needle during real time ultrasound guidance. 0.018 inch guidewire placed and 21 ga needle exchanged for transitional dilator set. Utilizing fluoroscopy, 0.035 inch guidewire advanced centrally without difficulty. Attention then turned to the right anterior upper chest. Following local lidocaine administration, a port pocket was created. The catheter was connected to the  port and brought from the pocket to the venotomy site through a subcutaneous tunnel. The catheter was cut to size and inserted through the peel-away sheath. The catheter tip was positioned at the cavoatrial junction using fluoroscopic guidance. The port aspirated and flushed well. The port pocket was closed with deep and superficial absorbable suture. IMPRESSION: Right IJ ClearVue Slim chest port is ready for use. Electronically Signed   By: Miachel Roux M.D.   On: 08/01/2022 08:17     ASSESSMENT/PLAN:  This is a very pleasant 61 years old Caucasian female presented with large right lower lobe lung mass in addition to right pleural effusion.  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 The patient was referred to Dr. Angelina Ok at [facility] for second opinion and she underwent several studies and intervention as listed below   1) repeat biopsy on August 11, 2018 of the lung mass at [facility] and it showed atypical spindle cell proliferation. 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 3) restaging CT scan of the chest on September 14, 2019 showed new 1.5 cm soft tissue lesion along the right subpulmonic diaphragmatic pleural surface worrisome for tumor recurrence.  She was followed by observation by Dr. Angelina Ok at [facility] 4) SBRT to the right lower lobe/diaphragmatic lesion completed June 22, 2020. 5) restaging scan on November 30, 2020 showed new right posterior pleural-based nodule followed by observation.  But repeat CT scan of the chest on 02/07/2021 showed enlarging right pleural mass/nodules. 6) the patient is started treatment with pazopanib on 02/17/2021 400 mg titrated to 800 mg p.o. daily but her treatment  was held between July 8 to March 05, 2021 secondary to fatigue and arthralgia.  She resumed her treatment at a dose of 400 mg p.o. daily for 3 days titrated to 600 mg p.o. daily but this was again held in mid August 2022.  She resumed her treatment on 04/23/2021 at 400 mg p.o. daily but this was discontinued in October 2022 secondary to disease progression was a scan on 05/30/2021 revealing progressive multifocal pleural-based disease.. There was also a scan on 09/20/21 which showed progressive multifocal pleural based disease.    Dr. Angelina Ok recommended she consider treatment with Avastin and  Temodar. She requested delaying starting treatment until after the holidays. She is on Avastin 5 mg/kg milligrams on days 8 and 22 every 4 weeks with Temodar p.o on days 1-7 and 15-21 every 4 weeks. Her Temodar had been on hold for several months due to significant thrombocytopenia requiring numerous platelet transfusions.  In June 2023, her lab work improved.  Her Temodar was resumed at a 50% dose reduction.  The patient is currently taking 140 milligrams days 1-7 only and Avastin only on day 8 every 4 weeks. Thus far, she has been tolerating this well.  She is here for day 8 cycle 7 today.   Unfortunately, November 2023 the patient evidence of disease progression with imaging performed with Dr. Angelina Ok at [facility].  Dr. Angelina Ok, at a minimum, recommended avastin days 8 and 22 every 4 weeks. Dr. Angelina Ok mentioned considering restarting her Temodar days 1 through 5 and days 15 through 19 and Avastin on days 8 and 22.  The patient is opting to start this after she has her Port-A-Cath placed which is scheduled for 07/31/2022.   We will start her new dosing from now with avastin b-monthly, cycle #9. Dr. Angelina Ok said can consider resuming bimonthly temodar but left up to our office discretion. I spoke to Dr. Julien Nordmann about the decision about resuming her Temodar days 1 through 5 as opposed to days 1 through 7 possibly  having this to be bimonthly. He recommended holding off for now due to her intolerance in the past. I discussed with the patient too today who agreed as she is fearful of significant thrombocytopenia which occurred earlier this year. She is going to continue her temodar days 1-7 only at this time.   She had a repeat baseline CT scan. We will reach out to radiology to send the scan result to Dr. Angelina Ok so he may compare this to her most recent scan performed in November 2023 to establish a new baseline.   We will write the patient an excuse from jury duty.  Given her fatigue, memory fog from treatment, and immunocompromise state we do not recommend that she participate in jury duty.  Additionally, the patient has several appointments with our clinic and missing appointments for jury duty could negatively affect her cancer related outcomes.  The patient was advised to call immediately if she has any concerning symptoms in the interval. The patient voices understanding of current disease status and treatment options and is in agreement with the current care plan. All questions were answered. The patient knows to call the clinic with any problems, questions or concerns. We can certainly see the patient much sooner if necessary     No orders of the defined types were placed in this encounter.    The total time spent in the appointment was 20-29 minutes.   Covey Baller L Jakiah Bienaime, PA-C 08/20/22

## 2022-08-20 ENCOUNTER — Inpatient Hospital Stay: Payer: BC Managed Care – PPO

## 2022-08-20 ENCOUNTER — Ambulatory Visit: Payer: BC Managed Care – PPO

## 2022-08-20 ENCOUNTER — Ambulatory Visit: Payer: BC Managed Care – PPO | Admitting: Internal Medicine

## 2022-08-20 ENCOUNTER — Encounter (HOSPITAL_BASED_OUTPATIENT_CLINIC_OR_DEPARTMENT_OTHER): Payer: Self-pay

## 2022-08-20 ENCOUNTER — Emergency Department (HOSPITAL_BASED_OUTPATIENT_CLINIC_OR_DEPARTMENT_OTHER)
Admission: EM | Admit: 2022-08-20 | Discharge: 2022-08-20 | Discharge status: Against Medical Advice | Payer: BC Managed Care – PPO | Attending: Emergency Medicine | Admitting: Emergency Medicine

## 2022-08-20 ENCOUNTER — Other Ambulatory Visit: Payer: BC Managed Care – PPO

## 2022-08-20 ENCOUNTER — Inpatient Hospital Stay (HOSPITAL_BASED_OUTPATIENT_CLINIC_OR_DEPARTMENT_OTHER): Payer: BC Managed Care – PPO | Admitting: Physician Assistant

## 2022-08-20 ENCOUNTER — Other Ambulatory Visit: Payer: Self-pay

## 2022-08-20 VITALS — HR 98

## 2022-08-20 DIAGNOSIS — D492 Neoplasm of unspecified behavior of bone, soft tissue, and skin: Secondary | ICD-10-CM

## 2022-08-20 DIAGNOSIS — Z79899 Other long term (current) drug therapy: Secondary | ICD-10-CM | POA: Insufficient documentation

## 2022-08-20 DIAGNOSIS — Z79630 Long term (current) use of alkylating agent: Secondary | ICD-10-CM | POA: Insufficient documentation

## 2022-08-20 DIAGNOSIS — T82598A Other mechanical complication of other cardiac and vascular devices and implants, initial encounter: Secondary | ICD-10-CM | POA: Insufficient documentation

## 2022-08-20 DIAGNOSIS — Y782 Prosthetic and other implants, materials and accessory radiological devices associated with adverse incidents: Secondary | ICD-10-CM | POA: Diagnosis not present

## 2022-08-20 DIAGNOSIS — J90 Pleural effusion, not elsewhere classified: Secondary | ICD-10-CM | POA: Insufficient documentation

## 2022-08-20 DIAGNOSIS — Z885 Allergy status to narcotic agent status: Secondary | ICD-10-CM | POA: Insufficient documentation

## 2022-08-20 DIAGNOSIS — Z5112 Encounter for antineoplastic immunotherapy: Secondary | ICD-10-CM | POA: Insufficient documentation

## 2022-08-20 DIAGNOSIS — Z5321 Procedure and treatment not carried out due to patient leaving prior to being seen by health care provider: Secondary | ICD-10-CM | POA: Diagnosis not present

## 2022-08-20 DIAGNOSIS — J45909 Unspecified asthma, uncomplicated: Secondary | ICD-10-CM | POA: Insufficient documentation

## 2022-08-20 DIAGNOSIS — Z95828 Presence of other vascular implants and grafts: Secondary | ICD-10-CM

## 2022-08-20 DIAGNOSIS — C3431 Malignant neoplasm of lower lobe, right bronchus or lung: Secondary | ICD-10-CM | POA: Insufficient documentation

## 2022-08-20 LAB — CBC WITH DIFFERENTIAL (CANCER CENTER ONLY)
Abs Immature Granulocytes: 0.01 10*3/uL (ref 0.00–0.07)
Basophils Absolute: 0 10*3/uL (ref 0.0–0.1)
Basophils Relative: 1 %
Eosinophils Absolute: 0.1 10*3/uL (ref 0.0–0.5)
Eosinophils Relative: 3 %
HCT: 36.5 % (ref 36.0–46.0)
Hemoglobin: 12.1 g/dL (ref 12.0–15.0)
Immature Granulocytes: 0 %
Lymphocytes Relative: 14 %
Lymphs Abs: 0.6 10*3/uL — ABNORMAL LOW (ref 0.7–4.0)
MCH: 32.3 pg (ref 26.0–34.0)
MCHC: 33.2 g/dL (ref 30.0–36.0)
MCV: 97.3 fL (ref 80.0–100.0)
Monocytes Absolute: 0.4 10*3/uL (ref 0.1–1.0)
Monocytes Relative: 8 %
Neutro Abs: 3.3 10*3/uL (ref 1.7–7.7)
Neutrophils Relative %: 74 %
Platelet Count: 132 10*3/uL — ABNORMAL LOW (ref 150–400)
RBC: 3.75 MIL/uL — ABNORMAL LOW (ref 3.87–5.11)
RDW: 13.1 % (ref 11.5–15.5)
WBC Count: 4.5 10*3/uL (ref 4.0–10.5)
nRBC: 0 % (ref 0.0–0.2)

## 2022-08-20 LAB — CMP (CANCER CENTER ONLY)
ALT: 15 U/L (ref 0–44)
AST: 23 U/L (ref 15–41)
Albumin: 4.1 g/dL (ref 3.5–5.0)
Alkaline Phosphatase: 83 U/L (ref 38–126)
Anion gap: 6 (ref 5–15)
BUN: 15 mg/dL (ref 6–20)
CO2: 30 mmol/L (ref 22–32)
Calcium: 9.6 mg/dL (ref 8.9–10.3)
Chloride: 104 mmol/L (ref 98–111)
Creatinine: 0.74 mg/dL (ref 0.44–1.00)
GFR, Estimated: >60 mL/min (ref >60)
Glucose, Bld: 100 mg/dL — ABNORMAL HIGH (ref 70–99)
Potassium: 4.1 mmol/L (ref 3.5–5.1)
Sodium: 140 mmol/L (ref 135–145)
Total Bilirubin: 0.4 mg/dL (ref 0.3–1.2)
Total Protein: 7 g/dL (ref 6.5–8.1)

## 2022-08-20 LAB — TOTAL PROTEIN, URINE DIPSTICK: Protein, ur: <30 mg/dL — AB

## 2022-08-20 MED ORDER — SODIUM CHLORIDE 0.9 % IV SOLN: Freq: Once | INTRAVENOUS | Status: AC

## 2022-08-20 MED ORDER — SODIUM CHLORIDE 0.9% FLUSH
10.0000 mL | Freq: Once | INTRAVENOUS | Status: AC
  Administered 2022-08-20: 10 mL via INTRAVENOUS

## 2022-08-20 MED ORDER — HEPARIN SOD (PORK) LOCK FLUSH 100 UNIT/ML IV SOLN
500.0000 [IU] | Freq: Once | INTRAVENOUS | Status: AC | PRN
  Administered 2022-08-20: 500 [IU]

## 2022-08-20 MED ORDER — SODIUM CHLORIDE 0.9 % IV SOLN
5.0000 mg/kg | Freq: Once | INTRAVENOUS | Status: AC
  Administered 2022-08-20: 350 mg via INTRAVENOUS
  Filled 2022-08-20: qty 14

## 2022-08-20 MED ORDER — SODIUM CHLORIDE 0.9% FLUSH
10.0000 mL | INTRAVENOUS | Status: DC | PRN
  Administered 2022-08-20: 10 mL

## 2022-08-20 NOTE — ED Triage Notes (Signed)
Patient here POV from Home.  [employer] placed in Early December. Accessed for Second time today and Patient noted bleeding from site hours after infusion of chemotherapy.   NAD Noted During Triage. A&Ox4. GCS 15. Ambulatory.

## 2022-08-21 ENCOUNTER — Other Ambulatory Visit (HOSPITAL_COMMUNITY): Payer: Self-pay

## 2022-08-21 ENCOUNTER — Other Ambulatory Visit: Payer: Self-pay

## 2022-08-22 ENCOUNTER — Other Ambulatory Visit (HOSPITAL_COMMUNITY): Payer: Self-pay

## 2022-08-23 ENCOUNTER — Other Ambulatory Visit: Payer: Self-pay

## 2022-08-31 NOTE — Progress Notes (Unsigned)
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     PRIOR THERAPY: 1) Status post right Pleurx catheter placement for drainage of recurrent right pleural effusion. 2) repeat biopsy on August 11, 2018 of the lung mass at [facility] and it showed atypical spindle cell proliferation. 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 4) restaging CT scan of the chest on September 14, 2019 showed new 1.5 cm soft tissue lesion along the right subpulmonic diaphragmatic pleural surface worrisome for tumor recurrence.  She was followed by observation by Dr. Angelina Ok at [place] [facility] 5) SBRT to the right lower lobe/diaphragmatic lesion completed June 22, 2020. 6) restaging scan on November 30, 2020 showed new right posterior pleural-based nodule followed by observation.  But repeat CT scan of the chest on 02/07/2021 showed enlarging right pleural mass/nodules. 7) the patient is started treatment with pazopanib on 02/17/2021 400 mg titrated to 800 mg p.o. daily but her treatment was held between July 8 to March 05, 2021 secondary to fatigue and arthralgia.  She resumed her treatment at a dose of 400 mg p.o. daily for 3 days titrated to 600 mg p.o. daily but this was again held in mid August 2022.  She resumed her treatment on 04/23/2021 at 400 mg p.o. daily but this was discontinued in October 2022 secondary to disease progression was a scan on 05/30/2021 revealing progressive multifocal pleural-based disease.  CURRENT THERAPY: Temodar 150 Mg/M2 daily for days 1-7 and 15-21  every 4 weeks in addition to a Avastin 5 Mg/KG on days 8 and 22 every 4 weeks.  Started October 15, 2021.  Starting from cycle #3 her dose of Temodar was reduced to 140 mg once daily on days 1-7 and 15-21 every 4 weeks.  Status post 8 cycles. Starting from cycle #4, the patient opted to undergo temodar for days 1-7 and avastin on day 8 once every 4 weeks starting on 03/25/22. Starting from cycle #9, the patient will restart temodar days 1-7 every 4 weeks and avastin on days 8 and 22 every 4 week due to disease progression.    INTERVAL HISTORY: TEMIMA KUTSCH 62 y.o. female returns to the clinic today for a follow-up visit. The patient recently was seen by Dr. Angelina Ok at [facility] in November 2023. She had repeat imaging studies that showed disease progression with multiple right sided pleural-based metastases increased in size. He feels that this is related to the altered dose scheduling that she has been on since August 2023 of Temodar days 1 through 7 and Avastin day 8 every 4 weeks. Dr. Angelina Ok recommended that at a minimum she resume Avastin bimonthly also mentioned could consider twice monthly Temodar dosing days 1 through 5 for tolerability and platelet count. She has a history of significant thrombocytopenia secondary to temodar in the Spring 2023. The patient wished to hold off on the bi-monthly dosing avastin until she can have her port a cath placed which was performed on on 12/11. She also is not interested in resuming her bimonthly temodar unless needed based off her next CT scan which will be at [facility]  in 3 months. In the past, she had allergy to a port-a-cath so she had a different port-a-cath brand placed which she thus far has been tolerating it well without any signs of allergy. However, on 12/26 after her infusion she had some bleeding from the needle site after her infusion. She went to the ER. After waiting for 3 hours, it stopped bleeding and she left without being seen.   Since then,  she denies any more bleeding.   Otherwise since last being seen, the patient denies any changes in her health except for some slightly worsening fatigue. Denies any fever, chills, night sweats, or unexplained weight loss. She denies shortness of breath but has decreased activity tolerance and tires quicker at baseline. Denies any abnormal bleeding except for bruising. Because of her significant thrombocytopenia/bleeding with full dose temodar in the past, she monitors for bleeding closely.  Denies any chest pain, cough, or hemoptysis.  Denies any abdominal pain.  She is here today for evaluation and repeat blood work before undergoing day 22 cycle 9   MEDICAL HISTORY: Past Medical History:  Diagnosis Date   Asthma    Cancer ([place])    Depression    Dyspnea    Pleural effusion on right    Pleural mass     ALLERGIES:  is allergic to cyanoacrylate, other, wound dressing adhesive, codeine, and lentil.  MEDICATIONS:  Current Outpatient Medications  Medication Sig Dispense Refill   Calcium Citrate 333 MG TABS Take 333 mg by mouth daily.     COVID-19 mRNA vaccine 2023-2024 (COMIRNATY) syringe Inject into the muscle. 0.3 mL 0   fluticasone (FLONASE) 50 MCG/ACT nasal spray Place 1 spray into both nostrils daily as needed for allergies.  5   IRON-VITAMIN C PO Take 1 tablet by mouth every other day.     meloxicam (MOBIC) 15 MG tablet Take 15 mg by mouth daily as needed for pain.      montelukast (SINGULAIR) 10 MG tablet Take 10 mg by mouth daily after supper.  10   Olopatadine HCl (PATADAY OP) Place 1 drop into both eyes daily as needed (allergies).     ondansetron (ZOFRAN) 8 MG tablet Take 1 tablet (8 mg total) by mouth every 8 (eight) hours as needed for nausea or vomiting. (Patient taking differently: Take 8 mg by mouth See admin instructions. Take 8 mg 1 hour before taking Temodar, 7 days a month) 30 tablet 2   pseudoephedrine (SUDAFED) 120 MG 12 hr tablet Take 120 mg by mouth See admin  instructions. Take 120 mg in the morning, may take a second 120 mg dose as needed for allergies     temozolomide (TEMODAR) 140 MG capsule Take 1 capsule (140 mg total) by mouth daily. Take on days 1-7 and days 15-21 of each 28 day cycle. May take on an empty stomach or at bedtime to decrease nausea & vomiting. (Patient taking differently: Take 140 mg by mouth See admin instructions. Take on days 1-7, no doses on days 8-28, repeat 28 day cycle. May take on an empty stomach or at bedtime to decrease nausea & vomiting.) 14 capsule 3   No current facility-administered medications for this visit.    SURGICAL HISTORY:  Past Surgical History:  Procedure Laterality Date   CHEST TUBE INSERTION Right 07/16/2018   Procedure: INSERTION PLEURAL DRAINAGE CATHETER, right;  Surgeon: Grace Isaac, MD;  Location: [place];  Service: Thoracic;  Laterality: Right;   COLONOSCOPY  2017   GUM SURGERY  1984   GRAFTS   IR IMAGING GUIDED PORT INSERTION  10/02/2021   IR IMAGING GUIDED PORT INSERTION  07/31/2022   IR RADIOLOGIST EVAL & MGMT  10/08/2021   IR RADIOLOGIST EVAL & MGMT  10/11/2021   IR RADIOLOGIST EVAL & MGMT  04/17/2022   IR REMOVAL TUN ACCESS W/ PORT W/O FL MOD SED  10/15/2021   IR THORACENTESIS ASP PLEURAL SPACE W/IMG GUIDE  07/03/2018    REVIEW OF SYSTEMS:   Review of Systems  Constitutional: Negative for appetite change, chills, fatigue, fever and unexpected weight change.  HENT: Negative for mouth sores, nosebleeds, sore throat and trouble swallowing.   Eyes: Negative for eye problems and icterus.  Respiratory: Negative for cough, hemoptysis, shortness of breath and wheezing.   Cardiovascular: Negative for chest pain and leg swelling.  Gastrointestinal: Negative for abdominal pain, constipation, diarrhea, nausea and vomiting.  Genitourinary: Negative for bladder incontinence, difficulty urinating, dysuria, frequency and hematuria.   Musculoskeletal: Negative for back pain, gait problem, neck pain  and neck stiffness.  Skin: Negative for itching and rash.  Neurological: Negative for dizziness, extremity weakness, gait problem, headaches, light-headedness and seizures.  Hematological: Negative for adenopathy. Does not bleed easily. Positive for easy bruising.  Psychiatric/Behavioral: Negative for confusion, depression and sleep disturbance. The patient is not nervous/anxious   PHYSICAL EXAMINATION:  Blood pressure (!) 141/81, pulse 98, temperature 98.1 F (36.7 C), temperature source Oral, resp. rate 16, weight 157 lb (71.2 kg), SpO2 100 %.  ECOG PERFORMANCE STATUS: 1  Physical Exam  Constitutional: Oriented to person, place, and time and well-developed, well-nourished, and in no distress.  HENT:  Head: Normocephalic and atraumatic.  Mouth/Throat: Oropharynx is clear and moist. No oropharyngeal exudate.  Eyes: Conjunctivae are normal. Right eye exhibits no discharge. Left eye exhibits no discharge. No scleral icterus.  Neck: Normal range of motion. Neck supple.  Cardiovascular: Normal rate, regular rhythm, normal heart sounds and intact distal pulses.   Pulmonary/Chest: Effort normal. Decreased breath sounds right lung. No respiratory distress. No wheezes. No rales.  Abdominal: Soft. Bowel sounds are normal. Exhibits no distension and no mass. There is no tenderness.  Musculoskeletal: Normal range of motion. Exhibits no edema.  Lymphadenopathy:    No cervical adenopathy.  Neurological: Alert and oriented to person, place, and time. Exhibits normal muscle tone. Gait normal. Coordination normal.  Skin: Bruise on left hand. Skin is warm and dry. No rash noted. Not diaphoretic. No erythema. No pallor.  Psychiatric: Mood, memory and judgment normal.  Vitals reviewed.  LABORATORY DATA: Lab Results  Component Value Date   WBC 5.8 09/03/2022   HGB 12.1 09/03/2022   HCT 35.9 (L) 09/03/2022   MCV 97.0 09/03/2022   PLT 147 (L) 09/03/2022      Chemistry      Component Value  Date/Time   NA 140 08/20/2022 1015   K 4.1 08/20/2022 1015   CL 104 08/20/2022 1015   CO2 30 08/20/2022 1015   BUN 15 08/20/2022 1015   CREATININE 0.74 08/20/2022 1015      Component Value Date/Time   CALCIUM 9.6 08/20/2022 1015   ALKPHOS 83 08/20/2022 1015   AST 23 08/20/2022 1015   ALT 15 08/20/2022 1015   BILITOT 0.4 08/20/2022 1015       RADIOGRAPHIC STUDIES:  CT Chest W Contrast  Result Date: 08/18/2022 CLINICAL DATA:  Post resection of sarcoma, follow-up evaluation. * Tracking Code: BO * EXAM: CT CHEST WITH CONTRAST TECHNIQUE: Multidetector CT imaging of the  chest was performed during intravenous contrast administration. RADIATION DOSE REDUCTION: This exam was performed according to the departmental dose-optimization program which includes automated exposure control, adjustment of the mA and/or kV according to patient size and/or use of iterative reconstruction technique. CONTRAST:  171mL OMNIPAQUE IOHEXOL 300 MG/ML  SOLN COMPARISON:  Previous chest imaging from November of 2019. FINDINGS: Cardiovascular: RIGHT-sided Port-A-Cath enters via IJ approach terminating at the caval to atrial junction. Heart size is normal without pericardial effusion or sign of pericardial nodularity. Central pulmonary vasculature is unremarkable on venous phase assessment. Mediastinum/Nodes: Posterior mediastinal adenopathy (image 107/2) 16-17 mm short axis. Pleural disease versus adenopathy on image 123/2) 3.5 cm short axis with heterogeneous features and areas of internal hyperenhancement just above the aortic hiatus. Lungs/Pleura: Post partial lung resection in the RIGHT lower lobe. Signs of pleural and parenchymal metastatic disease including chest wall involvement. (Image 116/2) 4.9 x 3.3 cm mass adjacent to resection margins in the medial RIGHT lower chest. This extends to involve the adjacent under surface of the RIGHT eleventh rib and extends into extrapleural fat in the RIGHT lower chest and involves  musculature of the intercostal muscles in the RIGHT lower chest. (Image 134/2) 5.6 x 4.4 cm heterogeneous enhancing mass in the anterior inferior costodiaphragmatic sulcus. This exerts mass effect upon the adjacent liver. Pleural based mass along the RIGHT posterolateral pleural surface in the RIGHT lower chest abutting the major fissure (image 70/2) 4.6 x 2.8 cm. Similar smaller lesion along the lateral margin of the pleural surface abutting the minor fissure in the RIGHT middle lobe on image 82/2 measuring 3.7 cm. Airways are patent. Anterior RIGHT upper lobe pleural based lesion (image 36/2) 2.7 cm abutting the under surface of the RIGHT first rib. LEFT chest is clear. Upper Abdomen: No signs of upper abdominal lymphadenopathy. No acute findings relative to liver, pancreas, spleen and adrenal glands to the extent imaged on today's study. Musculoskeletal: Chest wall involvement as discussed. Lesions along the RIGHT lateral chest also extended the extrapleural fat. No frank bony destruction at this time. Early heterogeneous mildly mottled appearance of the RIGHT first rib underlying RIGHT anterior upper lobe metastatic lesion. IMPRESSION: 1. Signs of pleural based metastatic disease including chest wall involvement as described. 2. Posterior mediastinal nodal disease. 3. Post partial lung resection in the RIGHT lower lobe. Electronically Signed   By: Zetta Bills M.D.   On: 08/18/2022 14:35     ASSESSMENT/PLAN:  This is a very pleasant 62 years old Caucasian female presented with large right lower lobe lung mass in addition to right pleural effusion.  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 The patient was referred to Dr. Angelina Ok at [place] [facility] for second opinion and she underwent several studies and intervention as listed below   1) repeat biopsy on August 11, 2018 of the lung mass at [facility] and it showed atypical spindle  cell proliferation. 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 3) restaging CT scan of the chest on September 14, 2019 showed new 1.5 cm soft tissue lesion along the right subpulmonic diaphragmatic pleural surface worrisome for tumor recurrence.  She was followed by observation by Dr. Angelina Ok at [place] [facility] 4) SBRT to the right lower lobe/diaphragmatic lesion completed June 22, 2020. 5) restaging  scan on November 30, 2020 showed new right posterior pleural-based nodule followed by observation.  But repeat CT scan of the chest on 02/07/2021 showed enlarging right pleural mass/nodules. 6) the patient is started treatment with pazopanib on 02/17/2021 400 mg titrated to 800 mg p.o. daily but her treatment was held between July 8 to March 05, 2021 secondary to fatigue and arthralgia.  She resumed her treatment at a dose of 400 mg p.o. daily for 3 days titrated to 600 mg p.o. daily but this was again held in mid August 2022.  She resumed her treatment on 04/23/2021 at 400 mg p.o. daily but this was discontinued in October 2022 secondary to disease progression was a scan on 05/30/2021 revealing progressive multifocal pleural-based disease.. There was also a scan on 09/20/21 which showed progressive multifocal pleural based disease.   Dr. Angelina Ok recommended she consider treatment with Avastin and Temodar. She requested delaying starting treatment until after the holidays. She is on Avastin 5 mg/kg milligrams on days 8 and 22 every 4 weeks with Temodar p.o on days 1-7 and 15-21 every 4 weeks. Her Temodar had been on hold for several months due to significant thrombocytopenia requiring numerous platelet transfusions.   In June 2023, her lab work improved.  Her Temodar was resumed at a 50% dose reduction.  The  patient is currently taking 140 milligrams days 1-7 only and Avastin only on day 8 every 4 weeks. Thus far, she has been tolerating this well.  She is here for day 22 cycle 9 today.   Unfortunately, November 2023 the patient evidence of disease progression with imaging performed with Dr. Angelina Ok at [facility].  Dr. Angelina Ok, at a minimum, recommended avastin days 8 and 22 every 4 weeks. Dr. Angelina Ok mentioned considering restarting her Temodar days 1 through 5 and days 15 through 19 and Avastin on days 8 and 22.   Dr. Angelina Ok said can consider resuming bimonthly temodar but left up to our office discretion. I spoke to Dr. Julien Nordmann about the decision about resuming her Temodar days 1 through 5 as opposed to days 1 through 7 possibly having this to be bimonthly. Dr. Julien Nordmann re-discussed with the patient today (09/03/22). She is expected to have a repeat CT scan at [facility] in 3 months. She is fearful of significant thrombocytopenia from temodar which occurred earlier this year. She is going to continue her temodar days 1-7 only at this time. If progression on next scan, she would be more open to resuming temodar twice a month.   Labs were reviewed. Recommend she proceed with cycle #9 day 22.    We will see her back a follow up visit in 2 weeks before starting day 8 cycle #10. Starting from cycle #10, we will only see her on day 8 of every cycle.   She will monitor her port site for bleeding.   The patient was advised to call immediately if she has any concerning symptoms in the interval. The patient voices understanding of current disease status and treatment options and is in agreement with the current care plan. All questions were answered. The patient knows to call the clinic with any problems, questions or concerns. We can certainly see the patient much sooner if necessary  No orders of the defined types were placed in this encounter.     Chistina Roston L Sunday Klos,  PA-C 09/03/22  ADDENDUM: Hematology/Oncology Attending:  I had a face-to-face encounter with the patient today.  I reviewed her records, lab and  recommended her care plan.  She is a very pleasant 62 years old white female with malignant neoplasm with sarcomatoid features presented as large right lower lobe lung mass with recurrent right pleural effusion diagnosed initially in December 2019 status post several chemotherapy regimens and she is followed closely by Dr. Angelina Ok at [place] [facility] who is a sarcoma specialist.  The patient is currently undergoing treatment with Temodar at a reduced dose of 140 mg daily for days 1-7 in addition to Avastin 5 Mg/KG on days 1 and 22 every 4 weeks.  She is status post 8 cycles of this treatment.  She has been on only day 8 of her Avastin for few cycles because of concern about IV access.  She now has a Port-A-Cath placed and she is doing fine except for minor bleeding after the last treatment. She is feeling much better today.  I recommended for her to proceed with day #22 of cycle #9 today as planned. She still like to continue on Temodar on days 1-7 and she has been avoiding the second dose of Temodar from day 15-21 because of concern about toxicity and significant thrombocytopenia has happened in the past. I recommended for the patient to proceed with her treatment as planned.  She will come back for follow-up visit in 2 weeks for evaluation before starting day 8 of cycle #10. The patient was advised to call immediately if she has any other concerning symptoms in the interval. Disclaimer: This note was dictated with voice recognition software. Similar sounding words can inadvertently be transcribed and may be missed upon review. Eilleen Kempf, MD

## 2022-09-02 ENCOUNTER — Telehealth: Payer: Self-pay | Admitting: Pharmacy Technician

## 2022-09-02 ENCOUNTER — Other Ambulatory Visit: Payer: Self-pay | Admitting: Physician Assistant

## 2022-09-02 ENCOUNTER — Other Ambulatory Visit (HOSPITAL_COMMUNITY): Payer: Self-pay

## 2022-09-02 NOTE — Telephone Encounter (Signed)
Oral Oncology Patient Advocate Encounter   Received notification that prior authorization for Temozolomide is due for renewal.   PA submitted on 09/02/22 Key [id number] Status is pending     Brenda Patel, CPhT-Adv Oncology Pharmacy Patient [place] Direct Number: [phone]  Fax: [phone]

## 2022-09-02 NOTE — Telephone Encounter (Signed)
Oral Oncology Patient Advocate Encounter  Prior Authorization for temozolomide has been approved.    PA# [id number] Effective dates: 09/02/22 through 09/02/23  Patients co-pay is $100.   Lady Deutscher, CPhT-Adv Oncology Pharmacy Patient [place] Direct Number: [phone]  Fax: [phone]

## 2022-09-03 ENCOUNTER — Inpatient Hospital Stay: Payer: BC Managed Care – PPO | Attending: Internal Medicine

## 2022-09-03 ENCOUNTER — Inpatient Hospital Stay (HOSPITAL_BASED_OUTPATIENT_CLINIC_OR_DEPARTMENT_OTHER): Payer: BC Managed Care – PPO | Admitting: Physician Assistant

## 2022-09-03 ENCOUNTER — Other Ambulatory Visit: Payer: Self-pay

## 2022-09-03 ENCOUNTER — Inpatient Hospital Stay: Payer: BC Managed Care – PPO

## 2022-09-03 VITALS — BP 146/77

## 2022-09-03 VITALS — BP 141/81 | HR 98 | Temp 98.1°F | Resp 16 | Wt 157.0 lb

## 2022-09-03 DIAGNOSIS — C3431 Malignant neoplasm of lower lobe, right bronchus or lung: Secondary | ICD-10-CM | POA: Diagnosis present

## 2022-09-03 DIAGNOSIS — Z5112 Encounter for antineoplastic immunotherapy: Secondary | ICD-10-CM | POA: Insufficient documentation

## 2022-09-03 DIAGNOSIS — R160 Hepatomegaly, not elsewhere classified: Secondary | ICD-10-CM | POA: Diagnosis not present

## 2022-09-03 DIAGNOSIS — Z79630 Long term (current) use of alkylating agent: Secondary | ICD-10-CM | POA: Diagnosis not present

## 2022-09-03 DIAGNOSIS — D61818 Other pancytopenia: Secondary | ICD-10-CM | POA: Insufficient documentation

## 2022-09-03 DIAGNOSIS — J90 Pleural effusion, not elsewhere classified: Secondary | ICD-10-CM | POA: Insufficient documentation

## 2022-09-03 DIAGNOSIS — R0609 Other forms of dyspnea: Secondary | ICD-10-CM | POA: Insufficient documentation

## 2022-09-03 DIAGNOSIS — M2550 Pain in unspecified joint: Secondary | ICD-10-CM | POA: Insufficient documentation

## 2022-09-03 DIAGNOSIS — D492 Neoplasm of unspecified behavior of bone, soft tissue, and skin: Secondary | ICD-10-CM

## 2022-09-03 DIAGNOSIS — R5383 Other fatigue: Secondary | ICD-10-CM | POA: Diagnosis not present

## 2022-09-03 DIAGNOSIS — Z885 Allergy status to narcotic agent status: Secondary | ICD-10-CM | POA: Diagnosis not present

## 2022-09-03 DIAGNOSIS — Z79899 Other long term (current) drug therapy: Secondary | ICD-10-CM | POA: Diagnosis not present

## 2022-09-03 DIAGNOSIS — D6959 Other secondary thrombocytopenia: Secondary | ICD-10-CM | POA: Diagnosis not present

## 2022-09-03 LAB — CBC WITH DIFFERENTIAL (CANCER CENTER ONLY)
Abs Immature Granulocytes: 0.01 10*3/uL (ref 0.00–0.07)
Basophils Absolute: 0 10*3/uL (ref 0.0–0.1)
Basophils Relative: 1 %
Eosinophils Absolute: 0.1 10*3/uL (ref 0.0–0.5)
Eosinophils Relative: 2 %
HCT: 35.9 % — ABNORMAL LOW (ref 36.0–46.0)
Hemoglobin: 12.1 g/dL (ref 12.0–15.0)
Immature Granulocytes: 0 %
Lymphocytes Relative: 14 %
Lymphs Abs: 0.8 10*3/uL (ref 0.7–4.0)
MCH: 32.7 pg (ref 26.0–34.0)
MCHC: 33.7 g/dL (ref 30.0–36.0)
MCV: 97 fL (ref 80.0–100.0)
Monocytes Absolute: 0.5 10*3/uL (ref 0.1–1.0)
Monocytes Relative: 9 %
Neutro Abs: 4.3 10*3/uL (ref 1.7–7.7)
Neutrophils Relative %: 74 %
Platelet Count: 147 10*3/uL — ABNORMAL LOW (ref 150–400)
RBC: 3.7 MIL/uL — ABNORMAL LOW (ref 3.87–5.11)
RDW: 12.9 % (ref 11.5–15.5)
WBC Count: 5.8 10*3/uL (ref 4.0–10.5)
nRBC: 0 % (ref 0.0–0.2)

## 2022-09-03 LAB — TOTAL PROTEIN, URINE DIPSTICK: Protein, ur: NEGATIVE mg/dL

## 2022-09-03 MED ORDER — SODIUM CHLORIDE 0.9 % IV SOLN: Freq: Once | INTRAVENOUS | Status: AC

## 2022-09-03 MED ORDER — SODIUM CHLORIDE 0.9 % IV SOLN
5.0000 mg/kg | Freq: Once | INTRAVENOUS | Status: AC
  Administered 2022-09-03: 350 mg via INTRAVENOUS
  Filled 2022-09-03: qty 14

## 2022-09-03 MED ORDER — SODIUM CHLORIDE 0.9% FLUSH
10.0000 mL | INTRAVENOUS | Status: DC | PRN
  Administered 2022-09-03: 10 mL

## 2022-09-03 MED ORDER — HEPARIN SOD (PORK) LOCK FLUSH 100 UNIT/ML IV SOLN
500.0000 [IU] | Freq: Once | INTRAVENOUS | Status: AC | PRN
  Administered 2022-09-03: 500 [IU]

## 2022-09-03 MED ORDER — SODIUM CHLORIDE 0.9% FLUSH
10.0000 mL | INTRAVENOUS | Status: DC | PRN
  Administered 2022-09-03: 10 mL via INTRAVENOUS

## 2022-09-03 NOTE — Patient Instructions (Signed)
[facility]  Discharge Instructions: Thank you for choosing [facility] to provide your oncology and hematology care.   If you have a lab appointment with the [facility], please go directly to the [facility] and check in at the registration area.   Wear comfortable clothing and clothing appropriate for easy access to any Portacath or PICC line.   We strive to give you quality time with your provider. You may need to reschedule your appointment if you arrive late (15 or more minutes).  Arriving late affects you and other patients whose appointments are after yours.  Also, if you miss three or more appointments without notifying the office, you may be dismissed from the clinic at the provider's discretion.      For prescription refill requests, have your pharmacy contact our office and allow 72 hours for refills to be completed.    Today you received the following chemotherapy and/or immunotherapy agent: Bevacizumab   To help prevent nausea and vomiting after your treatment, we encourage you to take your nausea medication as directed.  BELOW ARE SYMPTOMS THAT SHOULD BE REPORTED IMMEDIATELY: *FEVER GREATER THAN 100.4 F (38 C) OR HIGHER *CHILLS OR SWEATING *NAUSEA AND VOMITING THAT IS NOT CONTROLLED WITH YOUR NAUSEA MEDICATION *UNUSUAL SHORTNESS OF BREATH *UNUSUAL BRUISING OR BLEEDING *URINARY PROBLEMS (pain or burning when urinating, or frequent urination) *BOWEL PROBLEMS (unusual diarrhea, constipation, pain near the anus) TENDERNESS IN MOUTH AND THROAT WITH OR WITHOUT PRESENCE OF ULCERS (sore throat, sores in mouth, or a toothache) UNUSUAL RASH, SWELLING OR PAIN  UNUSUAL VAGINAL DISCHARGE OR ITCHING   Items with * indicate a potential emergency and should be followed up as soon as possible or go to the Emergency Department if any problems should occur.  Please show the CHEMOTHERAPY ALERT CARD or IMMUNOTHERAPY ALERT CARD at check-in to  the Emergency Department and triage nurse.  Should you have questions after your visit or need to cancel or reschedule your appointment, please contact [facility]  Dept: [phone]  and follow the prompts.  Office hours are 8:00 a.m. to 4:30 p.m. Monday - Friday. Please note that voicemails left after 4:00 p.m. may not be returned until the following business day.  We are closed weekends and major holidays. You have access to a nurse at all times for urgent questions. Please call the main number to the clinic Dept: [phone] and follow the prompts.   For any non-urgent questions, you may also contact your provider using MyChart. We now offer e-Visits for anyone 69 and older to request care online for non-urgent symptoms. For details visit [email or web address].   Also download the MyChart app! Go to the app store, search "MyChart", open the app, select [facility], and log in with your MyChart username and password.  Bevacizumab Injection What is this medication? BEVACIZUMAB (be va SIZ yoo mab) treats some types of cancer. It works by blocking a protein that causes cancer cells to grow and multiply. This helps to slow or stop the spread of cancer cells. It is a monoclonal antibody. This medicine may be used for other purposes; ask your health care provider or pharmacist if you have questions. COMMON BRAND NAME(S): Alymsys, Avastin, MVASI, Noah Charon What should I tell my care team before I take this medication? They need to know if you have any of these conditions: Blood clots Coughing up blood Having or recent surgery Heart failure High blood pressure History of a  connection between 2 or more body parts that do not usually connect (fistula) History of a tear in your stomach or intestines Protein in your urine An unusual or allergic reaction to bevacizumab, other medications, foods, dyes, or preservatives Pregnant or trying to get  pregnant Breast-feeding How should I use this medication? This medication is injected into a vein. It is given by your care team in a hospital or clinic setting. Talk to your care team the use of this medication in children. Special care may be needed. Overdosage: If you think you have taken too much of this medicine contact a poison control center or emergency room at once. NOTE: This medicine is only for you. Do not share this medicine with others. What if I miss a dose? Keep appointments for follow-up doses. It is important not to miss your dose. Call your care team if you are unable to keep an appointment. What may interact with this medication? Interactions are not expected. This list may not describe all possible interactions. Give your health care provider a list of all the medicines, herbs, non-prescription drugs, or dietary supplements you use. Also tell them if you smoke, drink alcohol, or use illegal drugs. Some items may interact with your medicine. What should I watch for while using this medication? Your condition will be monitored carefully while you are receiving this medication. You may need blood work while taking this medication. This medication may make you feel generally unwell. This is not uncommon as chemotherapy can affect healthy cells as well as cancer cells. Report any side effects. Continue your course of treatment even though you feel ill unless your care team tells you to stop. This medication may increase your risk to bruise or bleed. Call your care team if you notice any unusual bleeding. Before having surgery, talk to your care team to make sure it is ok. This medication can increase the risk of poor healing of your surgical site or wound. You will need to stop this medication for 28 days before surgery. After surgery, wait at least 28 days before restarting this medication. Make sure the surgical site or wound is healed enough before restarting this medication. Talk  to your care team if questions. Talk to your care team if you may be pregnant. Serious birth defects can occur if you take this medication during pregnancy and for 6 months after the last dose. Contraception is recommended while taking this medication and for 6 months after the last dose. Your care team can help you find the option that works for you. Do not breastfeed while taking this medication and for 6 months after the last dose. This medication can cause infertility. Talk to your care team if you are concerned about your fertility. What side effects may I notice from receiving this medication? Side effects that you should report to your care team as soon as possible: Allergic reactions--skin rash, itching, hives, swelling of the face, lips, tongue, or throat Bleeding--bloody or black, tar-like stools, vomiting blood or brown material that looks like coffee grounds, red or dark brown urine, small red or purple spots on skin, unusual bruising or bleeding Blood clot--pain, swelling, or warmth in the leg, shortness of breath, chest pain Heart attack--pain or tightness in the chest, shoulders, arms, or jaw, nausea, shortness of breath, cold or clammy skin, feeling faint or lightheaded Heart failure--shortness of breath, swelling of the ankles, feet, or hands, sudden weight gain, unusual weakness or fatigue Increase in blood pressure Infection--fever, chills,  cough, sore throat, wounds that don't heal, pain or trouble when passing urine, general feeling of discomfort or being unwell Infusion reactions--chest pain, shortness of breath or trouble breathing, feeling faint or lightheaded Kidney injury--decrease in the amount of urine, swelling of the ankles, hands, or feet Stomach pain that is severe, does not go away, or gets worse Stroke--sudden numbness or weakness of the face, arm, or leg, trouble speaking, confusion, trouble walking, loss of balance or coordination, dizziness, severe headache,  change in vision Sudden and severe headache, confusion, change in vision, seizures, which may be signs of posterior reversible encephalopathy syndrome (PRES) Side effects that usually do not require medical attention (report to your care team if they continue or are bothersome): Back pain Change in taste Diarrhea Dry skin Increased tears Nosebleed This list may not describe all possible side effects. Call your doctor for medical advice about side effects. You may report side effects to FDA at 1-800-FDA-1088. Where should I keep my medication? This medication is given in a hospital or clinic. It will not be stored at home. NOTE: This sheet is a summary. It may not cover all possible information. If you have questions about this medicine, talk to your doctor, pharmacist, or health care provider.  2023 Elsevier/Gold Standard (2021-12-14 00:00:00)

## 2022-09-09 ENCOUNTER — Other Ambulatory Visit: Payer: Self-pay

## 2022-09-09 ENCOUNTER — Encounter (HOSPITAL_COMMUNITY): Payer: Self-pay

## 2022-09-09 ENCOUNTER — Emergency Department (HOSPITAL_COMMUNITY): Payer: BC Managed Care – PPO

## 2022-09-09 ENCOUNTER — Telehealth: Payer: Self-pay | Admitting: Medical Oncology

## 2022-09-09 ENCOUNTER — Emergency Department (HOSPITAL_COMMUNITY)
Admission: EM | Admit: 2022-09-09 | Discharge: 2022-09-09 | Disposition: A | Discharge status: Home / Self Care | Payer: BC Managed Care – PPO | Attending: Emergency Medicine | Admitting: Emergency Medicine

## 2022-09-09 DIAGNOSIS — J90 Pleural effusion, not elsewhere classified: Secondary | ICD-10-CM

## 2022-09-09 DIAGNOSIS — Z85118 Personal history of other malignant neoplasm of bronchus and lung: Secondary | ICD-10-CM | POA: Insufficient documentation

## 2022-09-09 DIAGNOSIS — J45909 Unspecified asthma, uncomplicated: Secondary | ICD-10-CM | POA: Insufficient documentation

## 2022-09-09 DIAGNOSIS — R079 Chest pain, unspecified: Secondary | ICD-10-CM | POA: Diagnosis present

## 2022-09-09 LAB — CBC
HCT: 39.2 % (ref 36.0–46.0)
Hemoglobin: 12.8 g/dL (ref 12.0–15.0)
MCH: 32.5 pg (ref 26.0–34.0)
MCHC: 32.7 g/dL (ref 30.0–36.0)
MCV: 99.5 fL (ref 80.0–100.0)
Platelets: 146 10*3/uL — ABNORMAL LOW (ref 150–400)
RBC: 3.94 MIL/uL (ref 3.87–5.11)
RDW: 12.7 % (ref 11.5–15.5)
WBC: 6.7 10*3/uL (ref 4.0–10.5)
nRBC: 0 % (ref 0.0–0.2)

## 2022-09-09 LAB — BASIC METABOLIC PANEL
Anion gap: 12 (ref 5–15)
BUN: 10 mg/dL (ref 6–20)
CO2: 25 mmol/L (ref 22–32)
Calcium: 9.2 mg/dL (ref 8.9–10.3)
Chloride: 102 mmol/L (ref 98–111)
Creatinine, Ser: 0.61 mg/dL (ref 0.44–1.00)
GFR, Estimated: >60 mL/min (ref >60)
Glucose, Bld: 100 mg/dL — ABNORMAL HIGH (ref 70–99)
Potassium: 3.7 mmol/L (ref 3.5–5.1)
Sodium: 139 mmol/L (ref 135–145)

## 2022-09-09 LAB — TROPONIN I (HIGH SENSITIVITY): Troponin I (High Sensitivity): 3 ng/L (ref <18)

## 2022-09-09 MED ORDER — HEPARIN SOD (PORK) LOCK FLUSH 100 UNIT/ML IV SOLN
500.0000 [IU] | Freq: Once | INTRAVENOUS | Status: AC
  Administered 2022-09-09: 500 [IU] via INTRAVENOUS

## 2022-09-09 MED ORDER — IOHEXOL 350 MG/ML SOLN
100.0000 mL | Freq: Once | INTRAVENOUS | Status: AC | PRN
  Administered 2022-09-09: 100 mL via INTRAVENOUS

## 2022-09-09 MED ORDER — HEPARIN SOD (PORK) LOCK FLUSH 100 UNIT/ML IV SOLN
500.0000 [IU] | Freq: Once | INTRAVENOUS | Status: AC
  Administered 2022-09-09: 500 [IU]
  Filled 2022-09-09: qty 5

## 2022-09-09 NOTE — Telephone Encounter (Signed)
R chest pain /SOB new onset-Yesterday and today she is experiencing a sharp pain in R chest and feels SOB. She cannot get a deep breath .  On Thursday she had mild pain on her R lateral chest after yoga  " maybe a pulled muscle".   Sunday the pain was on the right ant chest and more intense with new SOB. Incentive spirometry goal post op  she could reach was 2500 . She checked it yesterday and " now I can barely reach 1750".  Per Dr. Arbutus Ped , I instructed Brenda Patel to go to [facility] ED. Report given to charge nurse Patty .

## 2022-09-09 NOTE — ED Provider Notes (Signed)
[facility]-EMERGENCY DEPT Provider Note   CSN: [id number] Arrival date & time: 09/09/22  1219     History  Chief Complaint  Patient presents with   Chest Pain    Brenda Patel is a 62 y.o. female.   Chest Pain Associated symptoms: fatigue and shortness of breath   Patient presents for chest pain and shortness of breath.  Medical history includes asthma, depression, pulmonary sarcoma.  Cancer was diagnosed in 2019.  She underwent RLL wedge resection in 2020.  She had SBRT for RLL/diaphragmatic lesion in 2021.  In 2022, she had a new right posterior lung nodule which showed increased size on repeat imaging.  She was started on chemotherapy in 2022.  She remains on chemotherapy currently.  She has had recurrent right-sided pleural effusions.  She has had Pleurx catheter placed in 2019.  This was removed following her January 2020 surgery.  She has not had any recurrence of pleural effusions since then.  12 days ago, she had a transient episode of right-sided chest pain.  Her last chemotherapy was 6 days ago.  At that time, she noticed increased fatigue.  She started checking her incentive spirometry.  At baseline, she can pull 2400.  5 days ago, she could only pull 2000.  Yesterday, she had recurrence of right-sided chest pain that radiates to right shoulder blade.  At this point, I asked with 1750.  She has had exertional shortness of breath.  Currently, she denies any pain.     Home Medications Prior to Admission medications   Medication Sig Start Date End Date Taking? Authorizing Provider  Calcium Citrate 333 MG TABS Take 333 mg by mouth daily.    [provider]  COVID-19 mRNA vaccine [phone] (COMIRNATY) syringe Inject into the muscle. 07/05/22     fluticasone (FLONASE) 50 MCG/ACT nasal spray Place 1 spray into both nostrils daily as needed for allergies. 05/05/18   [provider]  IRON-VITAMIN C PO Take 1 tablet by mouth every other day.     [provider]  meloxicam (MOBIC) 15 MG tablet Take 15 mg by mouth daily as needed for pain.  10/13/19   [provider]  montelukast (SINGULAIR) 10 MG tablet Take 10 mg by mouth daily after supper. 06/07/18   [provider]  Olopatadine HCl (PATADAY OP) Place 1 drop into both eyes daily as needed (allergies).    [provider]  ondansetron (ZOFRAN) 8 MG tablet Take 1 tablet (8 mg total) by mouth every 8 (eight) hours as needed for nausea or vomiting. Patient taking differently: Take 8 mg by mouth See admin instructions. Take 8 mg 1 hour before taking Temodar, 7 days a month 03/11/22   Heilingoetter, Cassandra L, PA-C  pseudoephedrine (SUDAFED) 120 MG 12 hr tablet Take 120 mg by mouth See admin instructions. Take 120 mg in the morning, may take a second 120 mg dose as needed for allergies    [provider]  temozolomide (TEMODAR) 140 MG capsule Take 1 capsule (140 mg total) by mouth daily. Take on days 1-7 and days 15-21 of each 28 day cycle. May take on an empty stomach or at bedtime to decrease nausea & vomiting. Patient taking differently: Take 140 mg by mouth See admin instructions. Take on days 1-7, no doses on days 8-28, repeat 28 day cycle. May take on an empty stomach or at bedtime to decrease nausea & vomiting. 02/25/22   Si Gaul, MD  Allergies    Cyanoacrylate, Other, Wound dressing adhesive, Codeine, and Lentil    Review of Systems   Review of Systems  Constitutional:  Positive for fatigue.  Respiratory:  Positive for shortness of breath.   Cardiovascular:  Positive for chest pain.  All other systems reviewed and are negative.   Physical Exam Updated Vital Signs BP (!) 155/85   Pulse 97   Temp 98.1 F (36.7 C) (Oral)   Resp 16   SpO2 99%  Physical Exam Vitals and nursing note reviewed.  Constitutional:      General: She is not in acute distress.    Appearance: She is well-developed. She is not ill-appearing,  toxic-appearing or diaphoretic.  HENT:     Head: Normocephalic and atraumatic.  Eyes:     Conjunctiva/sclera: Conjunctivae normal.  Neck:     Vascular: No JVD.  Cardiovascular:     Rate and Rhythm: Regular rhythm. Tachycardia present.     Heart sounds: No murmur heard. Pulmonary:     Effort: Pulmonary effort is normal. No respiratory distress.     Breath sounds: Examination of the right-middle field reveals decreased breath sounds and rales. Examination of the right-lower field reveals decreased breath sounds and rales. Decreased breath sounds and rales present.  Chest:     Chest wall: No tenderness.  Abdominal:     Palpations: Abdomen is soft.     Tenderness: There is no abdominal tenderness.  Musculoskeletal:        General: No swelling.     Cervical back: Normal range of motion and neck supple.     Right lower leg: No edema.     Left lower leg: No edema.  Skin:    General: Skin is warm and dry.     Coloration: Skin is not cyanotic or pale.  Neurological:     General: No focal deficit present.     Mental Status: She is alert and oriented to person, place, and time.  Psychiatric:        Mood and Affect: Mood normal.        Behavior: Behavior normal.     ED Results / Procedures / Treatments   Labs (all labs ordered are listed, but only abnormal results are displayed) Labs Reviewed  BASIC METABOLIC PANEL - Abnormal; Notable for the following components:      Result Value   Glucose, Bld 100 (*)    All other components within normal limits  CBC - Abnormal; Notable for the following components:   Platelets 146 (*)    All other components within normal limits  TROPONIN I (HIGH SENSITIVITY)    EKG None  Radiology CT Angio Chest PE W and/or Wo Contrast  Result Date: 09/09/2022 CLINICAL DATA:  Chest pain radiating to the back since 09/08/2022. Cough and shortness of breath. EXAM: CT ANGIOGRAPHY CHEST WITH CONTRAST TECHNIQUE: Multidetector CT imaging of the chest was  performed using the standard protocol during bolus administration of intravenous contrast. Multiplanar CT image reconstructions and MIPs were obtained to evaluate the vascular anatomy. RADIATION DOSE REDUCTION: This exam was performed according to the departmental dose-optimization program which includes automated exposure control, adjustment of the mA and/or kV according to patient size and/or use of iterative reconstruction technique. CONTRAST:  OMNIPAQUE IOHEXOL 350 MG/ML SOLN COMPARISON:  Chest x-ray earlier 09/09/2022. CT chest 08/14/2022 and older. Remote history of lung cancer FINDINGS: Cardiovascular: Heart is nonenlarged. No significant pericardial effusion. The thoracic aorta has some mild atherosclerotic plaque. Slight  ectasia of the ascending aorta with diameter approaching 3.6 cm. Nonaneurysmal. Right upper chest port identified. No segmental or larger pulmonary embolism identified. Mediastinum/Nodes: No specific abnormal lymph node enlargement identified in the axillary regions, hila. Some small right hilar nodes are identified, nonpathologic by size criteria. Small mediastinal nodes are also not pathologic. Lungs/Pleura: There is some motion throughout the left hemithorax. Apical pleural thickening. Basilar scar or atelectasis. No left-sided effusion or dominant mass. On the right side there are some surgical changes of the right lung base. Developing moderate large presumed loculated right-sided pleural effusion which is complex with areas of nodular soft tissue. Please correlate for known history of pleural metastases. The lateral lesion on the study of 08/14/2022 the measured 4.6 x 2.8 cm, today is less well seen with the effusion but is felt to measure proximally 4.0 x 1.6 cm on image 44. Lesion more caudal and lateral on the prior which measured AP length of 3.7 cm, today when measured in a similar fashion on series 5, image 60 would measure 3.7 cm by 2.5 cm, similar. Other visible areas  are similar as well. Mass along the extreme inferior costophrenic angle which appears extend into the chest wall musculature posteriorly, paraspinal previously measured 4.9 x 3.3 cm and today 5.3 by 3.6 cm. Upper Abdomen: Again there is mass along the anterior inferior cardiophrenic angle with mass effect along the liver at the level of diaphragm. Previously this measured 5.6 x 4.4 cm and today when measured in a similar fashion 5.7 by 3.9 cm. Similar. The adrenal glands are preserved. Musculoskeletal: Mild degenerative changes along the spine. Review of the MIP images confirms the above findings. IMPRESSION: No pulmonary embolism identified. Development of a moderate to large complex right-sided pleural effusion with loculated components. Surgical changes again seen at the right lung base with multiple pleural masses which compared to the more recent CT scan of 08/14/2022 appears similar in size overall when adjusting for technique. Please correlate for known clinical history of neoplasm. Chest port Aortic Atherosclerosis (ICD10-I70.0). Electronically Signed   By: Karen Kays M.D.   On: 09/09/2022 16:05   DG Chest 2 View  Result Date: 09/09/2022 CLINICAL DATA:  Pt complains of sob, fatigue, and intermittent right rib "sharp" pain since 1/3. Has an incentive spirometer she has used at home and has decreased from her baseline 2500 to 1700. Referred here by oncology. Hx pulmonary sarcoma EXAM: CHEST - 2 VIEW COMPARISON:  CT 08/14/2022 FINDINGS: 3.8 pleural-based mass laterally in the mid right lung, previously 3.3 cm. 12.3 cm posterior pleural based right mid lung opacity is new since prior exam. Poorly marginated opacity medially at the right lung base as before. Blunting of the right lateral costophrenic angle as before. Linear scarring or subsegmental atelectasis at the right lung base. Left lung is clear. Heart size and mediastinal contours are within normal limits. Stable right IJ port catheter to the distal  SVC. Sternotomy wires. IMPRESSION: 1. 12.3 cm posterior pleural based right midlung opacity may represent progression of tumor or loculated pseudotumor. Electronically Signed   By: Corlis Leak M.D.   On: 09/09/2022 13:18    Procedures Procedures    Medications Ordered in ED Medications  heparin lock flush 100 unit/mL (has no administration in time range)  iohexol (OMNIPAQUE) 350 MG/ML injection 100 mL (100 mLs Intravenous Contrast Given 09/09/22 1536)  heparin lock flush 100 unit/mL (500 Units Intravenous Given 09/09/22 1539)    ED Course/ Medical Decision Making/ A&P  Medical Decision Making Amount and/or Complexity of Data Reviewed Labs: ordered. Radiology: ordered.   This patient presents to the ED for concern of chest pain and shortness of breath, this involves an extensive number of treatment options, and is a complaint that carries with it a high risk of complications and morbidity.  The differential diagnosis includes ACS, worsening cancer, pleural effusion, PE, pneumonia   Co morbidities that complicate the patient evaluation  asthma, depression, pulmonary sarcoma   Additional history obtained:  Additional history obtained from N/A External records from outside source obtained and reviewed including EMR   Lab Tests:  I Ordered, and personally interpreted labs.  The pertinent results include: Normal hemoglobin, no leukocytosis, normal troponin, normal electrolytes   Imaging Studies ordered:  I ordered imaging studies including CTA chest I independently visualized and interpreted imaging which showed moderate-sized complex right pleural effusion I agree with the radiologist interpretation   Cardiac Monitoring: / EKG:  The patient was maintained on a cardiac monitor.  I personally viewed and interpreted the cardiac monitored which showed an underlying rhythm of: Sinus rhythm   Consultations Obtained:  I requested consultation with  the oncologist on-call,  and discussed lab and imaging findings as well as pertinent plan - they recommend: Thoracentesis.  PleurX catheter will be indicated if she does have recurrence.   Problem List / ED Course / Critical interventions / Medication management  Patient presents for right-sided chest pain and shortness of breath.  Although she has had exertional shortness of breath over the past week, this worsened yesterday, at time of onset of pain.  Pain has been intermittent.  Currently, she is pain-free.  Prior to being bedded in the ED, diagnostic workup was initiated.  On CTA of chest, patient had a moderate-sized right-sided loculated pleural effusion.  This would be the first recurrence of this in the past 4 years.  On exam, patient is well-appearing.  SpO2 is 97% on room air.  She does not have any increased work of breathing at rest.  She is mildly tachycardic in the range of 105.  Patient has decreased right-sided breath sounds and crackles consistent with CT findings.  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  Patient has not had any recent cough or sputum production.  I spoke with oncologist on-call who recommends thoracentesis.  If admitted, Dr. Arbutus Ped can see her tomorrow.  IR thoracentesis was ordered.  While in the ED, patient discussed her situation with her sister over the telephone.  Patient decided that she would prefer to go home tonight and contact Dr. Shirline Frees in the morning for possible scheduling of outpatient procedure.  She states that her symptoms have been mild and she has not had any orthopnea, which she did years before.  She was able to walk her dog yesterday.  She was advised to return to the ED at any time if she has any difficulty getting outpatient treatment or if she has any worsening of symptoms.  Patient seems very reliable and very in tune with monitoring her symptoms, vital signs, and incentive spirometry at home.  She was discharged in stable condition, in  accordance with her wishes.   Social Determinants of Health:  Has access to outpatient care   Test / Admission - Considered:  Encouraged staying in the ED versus admission for IR thoracentesis but patient declined stating that she would prefer outpatient management.         Final Clinical Impression(s) / ED Diagnoses Final diagnoses:  Pleural effusion, right    Rx / DC Orders ED Discharge Orders     None         Gloris Manchester, MD 09/09/22 2208

## 2022-09-09 NOTE — ED Triage Notes (Signed)
Patient said she began having chest pain out of nowhere yesterday that radiates to her right shoulder blade. Has sarcoma. Short of breath associated.

## 2022-09-09 NOTE — Discharge Instructions (Signed)
Call Dr. Shirline Frees first in the morning to discuss recent findings.  He may be able to schedule you with an outpatient thoracentesis.  If you develop any worsening symptoms, please return to the emergency department.

## 2022-09-09 NOTE — ED Provider Triage Note (Signed)
Emergency Medicine Provider Triage Evaluation Note  Brenda Patel , a 61 y.o. female  was evaluated in triage.  Pt complains of sob, fatigue, and intermittent right rib "sharp" pain since 1/3. Has an incentive spirometer she has used at home and has decreased from her baseline 2500 to 1700. Referred here by oncology. 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 No leg pain or swelling, no n/v/d, no anterior chest pain, no abdominal pain, no cough, congestion, or fever. No recent pneumonia or other infection requiring hospitalization.   Review of Systems  Positive: See HPI Negative: See HPI  Physical Exam  BP (!) 158/102 (BP Location: Left Arm)   Pulse (!) 110   Temp 97.6 F (36.4 C) (Oral)   Resp 18   SpO2 100%  Gen:   Awake, no distress   Resp:  Increased effort of breathing slightly with short sentences, Rales to right lung fields, remainder of lung fields clear to auscultation  MSK:   Moves extremities without difficulty, no LE edema or tenderness Other:  Abdomen soft and non-tender, no peritoneal signs  Medical Decision Making  Medically screening exam initiated at 12:57 PM.  Appropriate orders placed.  STARLETT PEHRSON was informed that the remainder of the evaluation will be completed by another provider, this initial triage assessment does not replace that evaluation, and the importance of remaining in the ED until their evaluation is complete.     Brenda Lederer, PA-C 09/09/22 1303

## 2022-09-10 ENCOUNTER — Other Ambulatory Visit: Payer: Self-pay | Admitting: Internal Medicine

## 2022-09-10 ENCOUNTER — Telehealth: Payer: Self-pay | Admitting: Medical Oncology

## 2022-09-10 DIAGNOSIS — C3491 Malignant neoplasm of unspecified part of right bronchus or lung: Secondary | ICD-10-CM

## 2022-09-10 NOTE — Telephone Encounter (Signed)
Seen in ED for Chest pain , not ablet to get a deep breath and onset shortness of breath and CT shows pleural effusion. Please advise.

## 2022-09-13 ENCOUNTER — Ambulatory Visit (HOSPITAL_COMMUNITY)
Admission: RE | Admit: 2022-09-13 | Discharge: 2022-09-13 | Disposition: A | Discharge status: Home / Self Care | Payer: BC Managed Care – PPO | Source: Ambulatory Visit | Attending: Internal Medicine | Admitting: Internal Medicine

## 2022-09-13 ENCOUNTER — Ambulatory Visit (HOSPITAL_COMMUNITY)
Admission: RE | Admit: 2022-09-13 | Discharge: 2022-09-13 | Disposition: A | Discharge status: Home / Self Care | Payer: BC Managed Care – PPO | Source: Ambulatory Visit | Attending: Physician Assistant | Admitting: Physician Assistant

## 2022-09-13 VITALS — BP 135/83

## 2022-09-13 DIAGNOSIS — J90 Pleural effusion, not elsewhere classified: Secondary | ICD-10-CM | POA: Diagnosis not present

## 2022-09-13 DIAGNOSIS — C3491 Malignant neoplasm of unspecified part of right bronchus or lung: Secondary | ICD-10-CM | POA: Diagnosis present

## 2022-09-13 MED ORDER — LIDOCAINE HCL 1 % IJ SOLN
INTRAMUSCULAR | Status: AC
  Administered 2022-09-13: 10 mL
  Filled 2022-09-13: qty 20

## 2022-09-13 NOTE — Procedures (Signed)
PROCEDURE SUMMARY:  Successful US guided right thoracentesis. Yielded 150cc of thick brown pleural fluid. Pt tolerated procedure well. No immediate complications.  Specimen was sent for labs. CXR ordered.  EBL < 5 mL  Kyree Fedorko PA-C 09/13/2022 10:49 AM

## 2022-09-16 ENCOUNTER — Other Ambulatory Visit: Payer: Self-pay

## 2022-09-16 ENCOUNTER — Other Ambulatory Visit (HOSPITAL_COMMUNITY): Payer: Self-pay

## 2022-09-16 ENCOUNTER — Other Ambulatory Visit: Payer: Self-pay | Admitting: Internal Medicine

## 2022-09-16 DIAGNOSIS — D492 Neoplasm of unspecified behavior of bone, soft tissue, and skin: Secondary | ICD-10-CM

## 2022-09-16 LAB — CYTOLOGY - NON PAP

## 2022-09-16 MED ORDER — TEMOZOLOMIDE 140 MG PO CAPS
140.0000 mg | ORAL_CAPSULE | Freq: Every day | ORAL | 3 refills | Status: DC
  Filled 2022-09-17: qty 14, 28d supply, fill #0

## 2022-09-17 ENCOUNTER — Other Ambulatory Visit (HOSPITAL_COMMUNITY): Payer: Self-pay

## 2022-09-17 ENCOUNTER — Inpatient Hospital Stay: Payer: BC Managed Care – PPO

## 2022-09-17 ENCOUNTER — Inpatient Hospital Stay: Payer: BC Managed Care – PPO | Admitting: Internal Medicine

## 2022-09-17 ENCOUNTER — Encounter: Payer: Self-pay | Admitting: Internal Medicine

## 2022-09-17 ENCOUNTER — Other Ambulatory Visit: Payer: Self-pay

## 2022-09-17 VITALS — BP 155/91 | HR 104 | Temp 97.8°F | Resp 17 | Wt 155.3 lb

## 2022-09-17 VITALS — BP 154/77 | HR 97 | Resp 16

## 2022-09-17 DIAGNOSIS — D492 Neoplasm of unspecified behavior of bone, soft tissue, and skin: Secondary | ICD-10-CM

## 2022-09-17 DIAGNOSIS — Z95828 Presence of other vascular implants and grafts: Secondary | ICD-10-CM

## 2022-09-17 DIAGNOSIS — C3431 Malignant neoplasm of lower lobe, right bronchus or lung: Secondary | ICD-10-CM | POA: Diagnosis not present

## 2022-09-17 LAB — CBC WITH DIFFERENTIAL (CANCER CENTER ONLY)
Abs Immature Granulocytes: 0.02 10*3/uL (ref 0.00–0.07)
Basophils Absolute: 0 10*3/uL (ref 0.0–0.1)
Basophils Relative: 1 %
Eosinophils Absolute: 0.1 10*3/uL (ref 0.0–0.5)
Eosinophils Relative: 2 %
HCT: 37.9 % (ref 36.0–46.0)
Hemoglobin: 12.7 g/dL (ref 12.0–15.0)
Immature Granulocytes: 0 %
Lymphocytes Relative: 15 %
Lymphs Abs: 0.8 10*3/uL (ref 0.7–4.0)
MCH: 32.3 pg (ref 26.0–34.0)
MCHC: 33.5 g/dL (ref 30.0–36.0)
MCV: 96.4 fL (ref 80.0–100.0)
Monocytes Absolute: 0.4 10*3/uL (ref 0.1–1.0)
Monocytes Relative: 8 %
Neutro Abs: 3.9 10*3/uL (ref 1.7–7.7)
Neutrophils Relative %: 74 %
Platelet Count: 145 10*3/uL — ABNORMAL LOW (ref 150–400)
RBC: 3.93 MIL/uL (ref 3.87–5.11)
RDW: 12.8 % (ref 11.5–15.5)
WBC Count: 5.3 10*3/uL (ref 4.0–10.5)
nRBC: 0 % (ref 0.0–0.2)

## 2022-09-17 LAB — CMP (CANCER CENTER ONLY)
ALT: 10 U/L (ref 0–44)
AST: 18 U/L (ref 15–41)
Albumin: 4 g/dL (ref 3.5–5.0)
Alkaline Phosphatase: 97 U/L (ref 38–126)
Anion gap: 6 (ref 5–15)
BUN: 12 mg/dL (ref 6–20)
CO2: 29 mmol/L (ref 22–32)
Calcium: 9.8 mg/dL (ref 8.9–10.3)
Chloride: 103 mmol/L (ref 98–111)
Creatinine: 0.66 mg/dL (ref 0.44–1.00)
GFR, Estimated: >60 mL/min (ref >60)
Glucose, Bld: 79 mg/dL (ref 70–99)
Potassium: 4.1 mmol/L (ref 3.5–5.1)
Sodium: 138 mmol/L (ref 135–145)
Total Bilirubin: 0.4 mg/dL (ref 0.3–1.2)
Total Protein: 7.1 g/dL (ref 6.5–8.1)

## 2022-09-17 LAB — TOTAL PROTEIN, URINE DIPSTICK: Protein, ur: NEGATIVE mg/dL

## 2022-09-17 MED ORDER — HEPARIN SOD (PORK) LOCK FLUSH 100 UNIT/ML IV SOLN
500.0000 [IU] | Freq: Once | INTRAVENOUS | Status: AC | PRN
  Administered 2022-09-17: 500 [IU]

## 2022-09-17 MED ORDER — SODIUM CHLORIDE 0.9% FLUSH
10.0000 mL | INTRAVENOUS | Status: DC | PRN
  Administered 2022-09-17: 10 mL

## 2022-09-17 MED ORDER — SODIUM CHLORIDE 0.9 % IV SOLN
5.0000 mg/kg | Freq: Once | INTRAVENOUS | Status: AC
  Administered 2022-09-17: 350 mg via INTRAVENOUS
  Filled 2022-09-17: qty 14

## 2022-09-17 MED ORDER — SODIUM CHLORIDE 0.9 % IV SOLN: Freq: Once | INTRAVENOUS | Status: AC

## 2022-09-17 MED ORDER — SODIUM CHLORIDE 0.9% FLUSH
10.0000 mL | INTRAVENOUS | Status: AC | PRN
  Administered 2022-09-17: 10 mL

## 2022-09-17 NOTE — Patient Instructions (Signed)
[facility]  Discharge Instructions: Thank you for choosing [facility] to provide your oncology and hematology care.   If you have a lab appointment with the Cancer Center, please go directly to the Cancer Center and check in at the registration area.   Wear comfortable clothing and clothing appropriate for easy access to any Portacath or PICC line.   We strive to give you quality time with your provider. You may need to reschedule your appointment if you arrive late (15 or more minutes).  Arriving late affects you and other patients whose appointments are after yours.  Also, if you miss three or more appointments without notifying the office, you may be dismissed from the clinic at the provider's discretion.      For prescription refill requests, have your pharmacy contact our office and allow 72 hours for refills to be completed.    Today you received the following chemotherapy and/or immunotherapy agent: Bevacizumab   To help prevent nausea and vomiting after your treatment, we encourage you to take your nausea medication as directed.  BELOW ARE SYMPTOMS THAT SHOULD BE REPORTED IMMEDIATELY: *FEVER GREATER THAN 100.4 F (38 C) OR HIGHER *CHILLS OR SWEATING *NAUSEA AND VOMITING THAT IS NOT CONTROLLED WITH YOUR NAUSEA MEDICATION *UNUSUAL SHORTNESS OF BREATH *UNUSUAL BRUISING OR BLEEDING *URINARY PROBLEMS (pain or burning when urinating, or frequent urination) *BOWEL PROBLEMS (unusual diarrhea, constipation, pain near the anus) TENDERNESS IN MOUTH AND THROAT WITH OR WITHOUT PRESENCE OF ULCERS (sore throat, sores in mouth, or a toothache) UNUSUAL RASH, SWELLING OR PAIN  UNUSUAL VAGINAL DISCHARGE OR ITCHING   Items with * indicate a potential emergency and should be followed up as soon as possible or go to the Emergency Department if any problems should occur.  Please show the CHEMOTHERAPY ALERT CARD or IMMUNOTHERAPY ALERT CARD at  check-in to the Emergency Department and triage nurse.  Should you have questions after your visit or need to cancel or reschedule your appointment, please contact [facility]  Dept: [phone]  and follow the prompts.  Office hours are 8:00 a.m. to 4:30 p.m. Monday - Friday. Please note that voicemails left after 4:00 p.m. may not be returned until the following business day.  We are closed weekends and major holidays. You have access to a nurse at all times for urgent questions. Please call the main number to the clinic Dept: [phone] and follow the prompts.   For any non-urgent questions, you may also contact your provider using MyChart. We now offer e-Visits for anyone 52 and older to request care online for non-urgent symptoms. For details visit [email or web address].   Also download the MyChart app! Go to the app store, search "MyChart", open the app, select , and log in with your MyChart username and password.  Bevacizumab Injection What is this medication? BEVACIZUMAB (be va SIZ yoo mab) treats some types of cancer. It works by blocking a protein that causes cancer cells to grow and multiply. This helps to slow or stop the spread of cancer cells. It is a monoclonal antibody. This medicine may be used for other purposes; ask your health care provider or pharmacist if you have questions. COMMON BRAND NAME(S): Alymsys, Avastin, MVASI, Omer Jack What should I tell my care team before I take this medication? They need to know if you have any of these conditions: Blood clots Coughing up blood Having or recent surgery Heart failure High blood  pressure History of a connection between 2 or more body parts that do not usually connect (fistula) History of a tear in your stomach or intestines Protein in your urine An unusual or allergic reaction to bevacizumab, other medications, foods, dyes, or preservatives Pregnant or trying to get  pregnant Breast-feeding How should I use this medication? This medication is injected into a vein. It is given by your care team in a hospital or clinic setting. Talk to your care team the use of this medication in children. Special care may be needed. Overdosage: If you think you have taken too much of this medicine contact a poison control center or emergency room at once. NOTE: This medicine is only for you. Do not share this medicine with others. What if I miss a dose? Keep appointments for follow-up doses. It is important not to miss your dose. Call your care team if you are unable to keep an appointment. What may interact with this medication? Interactions are not expected. This list may not describe all possible interactions. Give your health care provider a list of all the medicines, herbs, non-prescription drugs, or dietary supplements you use. Also tell them if you smoke, drink alcohol, or use illegal drugs. Some items may interact with your medicine. What should I watch for while using this medication? Your condition will be monitored carefully while you are receiving this medication. You may need blood work while taking this medication. This medication may make you feel generally unwell. This is not uncommon as chemotherapy can affect healthy cells as well as cancer cells. Report any side effects. Continue your course of treatment even though you feel ill unless your care team tells you to stop. This medication may increase your risk to bruise or bleed. Call your care team if you notice any unusual bleeding. Before having surgery, talk to your care team to make sure it is ok. This medication can increase the risk of poor healing of your surgical site or wound. You will need to stop this medication for 28 days before surgery. After surgery, wait at least 28 days before restarting this medication. Make sure the surgical site or wound is healed enough before restarting this medication. Talk  to your care team if questions. Talk to your care team if you may be pregnant. Serious birth defects can occur if you take this medication during pregnancy and for 6 months after the last dose. Contraception is recommended while taking this medication and for 6 months after the last dose. Your care team can help you find the option that works for you. Do not breastfeed while taking this medication and for 6 months after the last dose. This medication can cause infertility. Talk to your care team if you are concerned about your fertility. What side effects may I notice from receiving this medication? Side effects that you should report to your care team as soon as possible: Allergic reactions--skin rash, itching, hives, swelling of the face, lips, tongue, or throat Bleeding--bloody or black, tar-like stools, vomiting blood or brown material that looks like coffee grounds, red or dark brown urine, small red or purple spots on skin, unusual bruising or bleeding Blood clot--pain, swelling, or warmth in the leg, shortness of breath, chest pain Heart attack--pain or tightness in the chest, shoulders, arms, or jaw, nausea, shortness of breath, cold or clammy skin, feeling faint or lightheaded Heart failure--shortness of breath, swelling of the ankles, feet, or hands, sudden weight gain, unusual weakness or fatigue Increase in  blood pressure Infection--fever, chills, cough, sore throat, wounds that don't heal, pain or trouble when passing urine, general feeling of discomfort or being unwell Infusion reactions--chest pain, shortness of breath or trouble breathing, feeling faint or lightheaded Kidney injury--decrease in the amount of urine, swelling of the ankles, hands, or feet Stomach pain that is severe, does not go away, or gets worse Stroke--sudden numbness or weakness of the face, arm, or leg, trouble speaking, confusion, trouble walking, loss of balance or coordination, dizziness, severe headache,  change in vision Sudden and severe headache, confusion, change in vision, seizures, which may be signs of posterior reversible encephalopathy syndrome (PRES) Side effects that usually do not require medical attention (report to your care team if they continue or are bothersome): Back pain Change in taste Diarrhea Dry skin Increased tears Nosebleed This list may not describe all possible side effects. Call your doctor for medical advice about side effects. You may report side effects to FDA at 1-800-FDA-1088. Where should I keep my medication? This medication is given in a hospital or clinic. It will not be stored at home. NOTE: This sheet is a summary. It may not cover all possible information. If you have questions about this medicine, talk to your doctor, pharmacist, or health care provider.  2023 Elsevier/Gold Standard (2021-12-14 00:00:00)

## 2022-09-17 NOTE — Progress Notes (Signed)
[facility] Telephone:([phone]) [phone]   Fax:([phone]) [phone]  OFFICE PROGRESS NOTE  Camie Patience, FNP [address] 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  PRIOR THERAPY:  1) Status post right Pleurx catheter placement for drainage of recurrent right pleural effusion. 2) repeat biopsy on August 11, 2018 of the lung mass at [facility] and it showed atypical spindle cell proliferation. 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 4) restaging CT scan of the chest on September 14, 2019 showed new 1.5 cm soft tissue lesion along the right subpulmonic diaphragmatic pleural surface worrisome for tumor recurrence.  She was followed by observation by Dr. Waymon Amato at [facility] cancer center 5) SBRT to the right lower lobe/diaphragmatic lesion completed June 22, 2020. 6) restaging scan on November 30, 2020 showed new right posterior pleural-based nodule followed by observation.  But repeat CT scan of the chest on 02/07/2021 showed enlarging right pleural mass/nodules. 7) the patient is started treatment with pazopanib on 02/17/2021 400 mg titrated to 800 mg p.o. daily but her treatment was held between July 8 to March 05, 2021 secondary to fatigue and arthralgia.  She resumed her treatment at a dose of 400 mg p.o. daily for 3 days titrated to 600 mg p.o. daily but this was again held in mid August 2022.  She resumed her treatment on 04/23/2021 at 400 mg p.o. daily but this was discontinued in October 2022 secondary to disease progression was a scan on 05/30/2021 revealing progressive multifocal pleural-based disease.  CURRENT  THERAPY: Temodar 150 Mg/M2 daily for days 1-7 and 15-21 every 4 weeks in addition to a Avastin 5 Mg/KG on days 8 and 22 every 4 weeks.  Started October 15, 2021.  Starting from cycle #3 her dose of Temodar was reduced to 140 mg once daily on days 1-7 and 15-21 every 4 weeks.  Status post 9 cycles of treatment.  Starting from cycle #4 her treatment was reduced to Temodar 140 Mg on days 1-7 and Avastin 5 Mg/KG on day 8 every 4 weeks based on her request secondary to intolerance.  She resumed day 22 of Avastin on cycle #9.  INTERVAL HISTORY: Brenda Patel 62 y.o. female returns to the clinic today for follow-up visit.  The patient is feeling fine today with no concerning complaints.  She had bad experience at the emergency department where she waited for 7 hours for evaluation.  She denied having any current chest pain but has shortness of breath with exertion with no cough or hemoptysis.  She has no nausea, vomiting, diarrhea or constipation.  She has no headache or visual changes.  She has no bleeding, bruises or ecchymosis.  She had CT angiogram of the chest performed during her evaluation and that showed no evidence for pulmonary embolism but there was development of moderate to large complex right-sided pleural effusion with loculated components and there was similar size to the pleural-based masses.  The patient underwent ultrasound-guided right thoracentesis with drainage of 150 mL of pleural fluid that was tested positive for malignancy.  She is here today for evaluation before starting day 8 of cycle #10.  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  MEDICATIONS:  Current Outpatient Medications  Medication Sig Dispense Refill   Calcium Citrate 333 MG TABS Take 333 mg by mouth daily.      COVID-19 mRNA vaccine 2023-2024 (COMIRNATY) syringe Inject into the muscle. 0.3 mL 0   fluticasone (FLONASE) 50 MCG/ACT nasal spray Place 1 spray into both nostrils daily as needed for allergies.  5   IRON-VITAMIN C PO Take 1 tablet by mouth every other day.     meloxicam (MOBIC) 15 MG tablet Take 15 mg by mouth daily as needed for pain.      montelukast (SINGULAIR) 10 MG tablet Take 10 mg by mouth daily after supper.  10   Olopatadine HCl (PATADAY OP) Place 1 drop into both eyes daily as needed (allergies).     ondansetron (ZOFRAN) 8 MG tablet Take 1 tablet (8 mg total) by mouth every 8 (eight) hours as needed for nausea or vomiting. (Patient taking differently: Take 8 mg by mouth See admin instructions. Take 8 mg 1 hour before taking Temodar, 7 days a month) 30 tablet 2   pseudoephedrine (SUDAFED) 120 MG 12 hr tablet Take 120 mg by mouth See admin instructions. Take 120 mg in the morning, may take a second 120 mg dose as needed for allergies     temozolomide (TEMODAR) 140 MG capsule Take 1 capsule (140 mg total) by mouth daily. Take on days 1-7 and days 15-21 of each 28 day cycle. May take on an empty stomach or at bedtime to decrease nausea & vomiting. 14 capsule 3   No current facility-administered medications for this visit.    SURGICAL HISTORY:  Past Surgical History:  Procedure Laterality Date   CHEST TUBE INSERTION Right 07/16/2018   Procedure: INSERTION PLEURAL DRAINAGE CATHETER, right;  Surgeon: Delight Ovens, MD;  Location: [facility] OR;  Service: Thoracic;  Laterality: Right;   COLONOSCOPY  2017   GUM SURGERY  1984   GRAFTS   IR IMAGING GUIDED PORT INSERTION  10/02/2021   IR IMAGING GUIDED PORT INSERTION  07/31/2022   IR RADIOLOGIST EVAL & MGMT  10/08/2021   IR RADIOLOGIST EVAL & MGMT  10/11/2021   IR RADIOLOGIST EVAL & MGMT  04/17/2022   IR REMOVAL TUN ACCESS W/ PORT W/O FL MOD SED  10/15/2021   IR THORACENTESIS ASP PLEURAL SPACE W/IMG GUIDE  07/03/2018    REVIEW OF SYSTEMS:   Constitutional: positive for fatigue Eyes: negative Ears, nose, mouth, throat, and face: negative Respiratory: positive for dyspnea on exertion Cardiovascular: negative Gastrointestinal: negative Genitourinary:negative Integument/breast: negative Hematologic/lymphatic: negative Musculoskeletal:negative Neurological: negative Behavioral/Psych: negative Endocrine: negative Allergic/Immunologic: negative   PHYSICAL EXAMINATION: General appearance: alert, cooperative, and no distress Head: Normocephalic, without obvious abnormality, atraumatic Neck: no adenopathy, no JVD, supple, symmetrical, trachea midline, and thyroid not enlarged, symmetric, no tenderness/mass/nodules Lymph nodes: Cervical, supraclavicular, and axillary nodes normal. Resp: diminished breath sounds RLL and dullness to percussion RLL Back: symmetric, no curvature. ROM normal. No CVA tenderness. Cardio: regular rate and rhythm, S1, S2 normal, no murmur, click, rub or gallop GI: soft, non-tender; bowel sounds normal; no masses,  no organomegaly Extremities: extremities normal, atraumatic, no cyanosis or edema Neurologic: Alert and oriented X 3, normal strength and tone. Normal symmetric reflexes. Normal  coordination and gait  ECOG PERFORMANCE STATUS: 1 - Symptomatic but completely ambulatory  Blood pressure (!) 155/91, pulse (!) 104, temperature 97.8 F (36.6 C), temperature source Oral, resp. rate 17, weight 155 lb 4.8 oz (70.4 kg), SpO2 100 %.  LABORATORY DATA: Lab Results  Component Value Date   WBC 5.3 09/17/2022   HGB 12.7 09/17/2022   HCT 37.9 09/17/2022   MCV 96.4 09/17/2022   PLT 145 (L) 09/17/2022      Chemistry      Component Value Date/Time   NA 139 09/09/2022 1234   K 3.7 09/09/2022 1234   CL 102 09/09/2022 1234   CO2 25 09/09/2022 1234   BUN 10 09/09/2022 1234   CREATININE 0.61 09/09/2022 1234   CREATININE 0.74 08/20/2022 1015      Component Value Date/Time   CALCIUM 9.2 09/09/2022 1234    ALKPHOS 83 08/20/2022 1015   AST 23 08/20/2022 1015   ALT 15 08/20/2022 1015   BILITOT 0.4 08/20/2022 1015       RADIOGRAPHIC STUDIES: US Thoracentesis Asp Pleural space w/IMG guide  Result Date: 09/13/2022 INDICATION: History of Sarcomatoid carcinoma, pleural effusion EXAM: ULTRASOUND GUIDED RIGHT THORACENTESIS MEDICATIONS: None. COMPLICATIONS: None immediate. PROCEDURE: An ultrasound guided thoracentesis was thoroughly discussed with the patient and questions answered. The benefits, risks, alternatives and complications were also discussed. The patient understands and wishes to proceed with the procedure. Written consent was obtained. Ultrasound was performed to localize and mark an adequate pocket of fluid in the right chest. The area was then prepped and draped in the normal sterile fashion. 1% Lidocaine was used for local anesthesia. Under ultrasound guidance a 6 Fr Safe-T-Centesis catheter was introduced. Thoracentesis was performed. The catheter was removed and a dressing applied. FINDINGS: A total of approximately 150cc of thick brown pleural fluid was removed. Samples were sent to the laboratory. IMPRESSION: Successful ultrasound guided right thoracentesis yielding 150cc of pleural fluid. Performed and dictated by Sheliah Plane, PA-C Electronically Signed   By: Marliss Coots M.D.   On: 09/13/2022 12:06   DG CHEST PORT 1 VIEW  Result Date: 09/13/2022 CLINICAL DATA:  S/P thoracentesis EXAM: PORTABLE CHEST 1 VIEW COMPARISON:  09/09/2022. FINDINGS: Mild decrease in loculated right pleural effusion. No visible pneumothorax. Streaky opacities on the right, likely atelectasis. No confluent consolidation. Cardiomediastinal silhouette is unchanged. Median sternotomy. Right IJ Port-A-Cath with the tip projecting at the lower SVC. IMPRESSION: Mild decrease in loculated right pleural effusion. No visible pneumothorax. Electronically Signed   By: Feliberto Harts M.D.   On: 09/13/2022 10:59   CT  Angio Chest PE W and/or Wo Contrast  Result Date: 09/09/2022 CLINICAL DATA:  Chest pain radiating to the back since 09/08/2022. Cough and shortness of breath. EXAM: CT ANGIOGRAPHY CHEST WITH CONTRAST TECHNIQUE: Multidetector CT imaging of the chest was performed using the standard protocol during bolus administration of intravenous contrast. Multiplanar CT image reconstructions and MIPs were obtained to evaluate the vascular anatomy. RADIATION DOSE REDUCTION: This exam was performed according to the departmental dose-optimization program which includes automated exposure control, adjustment of the mA and/or kV according to patient size and/or use of iterative reconstruction technique. CONTRAST:  OMNIPAQUE IOHEXOL 350 MG/ML SOLN COMPARISON:  Chest x-ray earlier 09/09/2022. CT chest 08/14/2022 and older. Remote history of lung cancer FINDINGS: Cardiovascular: Heart is nonenlarged. No significant pericardial effusion. The thoracic aorta has some mild atherosclerotic plaque. Slight ectasia of the ascending aorta with diameter approaching 3.6 cm. Nonaneurysmal. Right upper chest port identified.  No segmental or larger pulmonary embolism identified. Mediastinum/Nodes: No specific abnormal lymph node enlargement identified in the axillary regions, hila. Some small right hilar nodes are identified, nonpathologic by size criteria. Small mediastinal nodes are also not pathologic. Lungs/Pleura: There is some motion throughout the left hemithorax. Apical pleural thickening. Basilar scar or atelectasis. No left-sided effusion or dominant mass. On the right side there are some surgical changes of the right lung base. Developing moderate large presumed loculated right-sided pleural effusion which is complex with areas of nodular soft tissue. Please correlate for known history of pleural metastases. The lateral lesion on the study of 08/14/2022 the measured 4.6 x 2.8 cm, today is less well seen with the effusion but is felt  to measure proximally 4.0 x 1.6 cm on image 44. Lesion more caudal and lateral on the prior which measured AP length of 3.7 cm, today when measured in a similar fashion on series 5, image 60 would measure 3.7 cm by 2.5 cm, similar. Other visible areas are similar as well. Mass along the extreme inferior costophrenic angle which appears extend into the chest wall musculature posteriorly, paraspinal previously measured 4.9 x 3.3 cm and today 5.3 by 3.6 cm. Upper Abdomen: Again there is mass along the anterior inferior cardiophrenic angle with mass effect along the liver at the level of diaphragm. Previously this measured 5.6 x 4.4 cm and today when measured in a similar fashion 5.7 by 3.9 cm. Similar. The adrenal glands are preserved. Musculoskeletal: Mild degenerative changes along the spine. Review of the MIP images confirms the above findings. IMPRESSION: No pulmonary embolism identified. Development of a moderate to large complex right-sided pleural effusion with loculated components. Surgical changes again seen at the right lung base with multiple pleural masses which compared to the more recent CT scan of 08/14/2022 appears similar in size overall when adjusting for technique. Please correlate for known clinical history of neoplasm. Chest port Aortic Atherosclerosis (ICD10-I70.0). Electronically Signed   By: Karen Kays M.D.   On: 09/09/2022 16:05   DG Chest 2 View  Result Date: 09/09/2022 CLINICAL DATA:  Pt complains of sob, fatigue, and intermittent right rib "sharp" pain since 1/3. Has an incentive spirometer she has used at home and has decreased from her baseline 2500 to 1700. Referred here by oncology. Hx pulmonary sarcoma EXAM: CHEST - 2 VIEW COMPARISON:  CT 08/14/2022 FINDINGS: 3.8 pleural-based mass laterally in the mid right lung, previously 3.3 cm. 12.3 cm posterior pleural based right mid lung opacity is new since prior exam. Poorly marginated opacity medially at the right lung base as before.  Blunting of the right lateral costophrenic angle as before. Linear scarring or subsegmental atelectasis at the right lung base. Left lung is clear. Heart size and mediastinal contours are within normal limits. Stable right IJ port catheter to the distal SVC. Sternotomy wires. IMPRESSION: 1. 12.3 cm posterior pleural based right midlung opacity may represent progression of tumor or loculated pseudotumor. Electronically Signed   By: Corlis Leak M.D.   On: 09/09/2022 13:18     ASSESSMENT AND PLAN: This is a very pleasant 62 years old white female presented with large right lower lobe lung mass in addition to right pleural effusion.  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 The patient was referred to Dr. Waymon Amato at [facility] center for second opinion and she underwent several studies and intervention as listed below 1) repeat biopsy on August 11, 2018 of the  lung mass at [facility] and it showed atypical spindle cell proliferation. 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 3) restaging CT scan of the chest on September 14, 2019 showed new 1.5 cm soft tissue lesion along the right subpulmonic diaphragmatic pleural surface worrisome for tumor recurrence.  She was followed by observation by Dr. Waymon Amato at [facility] center 4) SBRT to the right lower lobe/diaphragmatic lesion completed June 22, 2020. 5) restaging scan on November 30, 2020 showed new right posterior pleural-based nodule followed by observation.  But repeat CT scan of the chest on 02/07/2021 showed enlarging right pleural mass/nodules. 6) the patient is started treatment with pazopanib on 02/17/2021 400 mg titrated to 800 mg p.o. daily but her treatment was held between  July 8 to March 05, 2021 secondary to fatigue and arthralgia.  She resumed her treatment at a dose of 400 mg p.o. daily for 3 days titrated to 600 mg p.o. daily but this was again held in mid August 2022.  She resumed her treatment on 04/23/2021 at 400 mg p.o. daily but this was discontinued in October 2022 secondary to disease progression was a scan on 05/30/2021 revealing progressive multifocal pleural-based disease. Dr. Waymon Amato recommended for the patient treatment with Temodar and Avastin.  She started Temodar 150 Mg/M2 on days 1 and 7 as well as day 15-21 in addition to Avastin 5 Mg/KG on days 1 and 22 every 4 weeks.  She is status post 2 cycles.   Her treatment was complicated with significant pancytopenia especially thrombocytopenia secondary to the treatment with Temodar.  It took her several weeks to recover from this adverse effects. She resumed her treatment recently with reduced dose Temodar 140 mg p.o. on days 1-7 and 15-22 and she is tolerating it much better.  Status post 9 cycle with reduced regimen.  Starting from cycle #4 she is on treatment with Temodar 140 Mg on days 1-7 and Avastin 5 Mg/KG on day 8 every 4 weeks.  She resumed Avastin on days 8 and 22 starting from cycle #9. The patient has been tolerating this treatment fairly well with no concerning adverse effects. Her most recent CT scan of the chest showed no evidence for disease progression.  I personally and independently reviewed the scan images and discussed the results with the patient today. She had ultrasound-guided right thoracentesis with drainage of 150 mL of pleural fluid that was tested positive for malignant cells which is consistent with her disease. I recommended for the patient to proceed with day 8 of cycle #10 today as planned. I will see her back for follow-up visit in 4 weeks for evaluation before starting day 8 of cycle #11. The patient was advised to call immediately if she has any other concerning symptoms in the  interval. The patient voices understanding of current disease status and treatment options and is in agreement with the current care plan.  All questions were answered. The patient knows to call the clinic with any problems, questions or concerns. We can certainly see the patient much sooner if necessary.  The total time spent in the appointment was 30 minutes.  Disclaimer: This note was dictated with voice recognition software. Similar sounding words can inadvertently be transcribed and may not be corrected upon review.

## 2022-09-18 ENCOUNTER — Other Ambulatory Visit: Payer: Self-pay

## 2022-09-20 ENCOUNTER — Other Ambulatory Visit: Payer: Self-pay

## 2022-09-20 ENCOUNTER — Other Ambulatory Visit (HOSPITAL_COMMUNITY): Payer: Self-pay

## 2022-09-24 ENCOUNTER — Other Ambulatory Visit (HOSPITAL_COMMUNITY): Payer: Self-pay

## 2022-09-26 ENCOUNTER — Telehealth: Payer: Self-pay | Admitting: Internal Medicine

## 2022-09-26 NOTE — Telephone Encounter (Signed)
Called patient regarding February-March appointments, patient is notified.

## 2022-09-29 ENCOUNTER — Other Ambulatory Visit: Payer: Self-pay

## 2022-09-30 ENCOUNTER — Ambulatory Visit: Payer: BC Managed Care – PPO | Admitting: Internal Medicine

## 2022-09-30 ENCOUNTER — Inpatient Hospital Stay: Payer: BC Managed Care – PPO

## 2022-09-30 ENCOUNTER — Inpatient Hospital Stay: Payer: BC Managed Care – PPO | Attending: Internal Medicine

## 2022-09-30 VITALS — BP 147/81 | HR 91 | Temp 98.7°F | Resp 16 | Wt 156.4 lb

## 2022-09-30 DIAGNOSIS — Z79899 Other long term (current) drug therapy: Secondary | ICD-10-CM | POA: Diagnosis not present

## 2022-09-30 DIAGNOSIS — K5900 Constipation, unspecified: Secondary | ICD-10-CM | POA: Insufficient documentation

## 2022-09-30 DIAGNOSIS — M2550 Pain in unspecified joint: Secondary | ICD-10-CM | POA: Diagnosis not present

## 2022-09-30 DIAGNOSIS — Z79630 Long term (current) use of alkylating agent: Secondary | ICD-10-CM | POA: Diagnosis not present

## 2022-09-30 DIAGNOSIS — C3431 Malignant neoplasm of lower lobe, right bronchus or lung: Secondary | ICD-10-CM | POA: Diagnosis present

## 2022-09-30 DIAGNOSIS — D61818 Other pancytopenia: Secondary | ICD-10-CM | POA: Diagnosis not present

## 2022-09-30 DIAGNOSIS — Z95828 Presence of other vascular implants and grafts: Secondary | ICD-10-CM | POA: Insufficient documentation

## 2022-09-30 DIAGNOSIS — I10 Essential (primary) hypertension: Secondary | ICD-10-CM | POA: Diagnosis not present

## 2022-09-30 DIAGNOSIS — Z885 Allergy status to narcotic agent status: Secondary | ICD-10-CM | POA: Insufficient documentation

## 2022-09-30 DIAGNOSIS — R5383 Other fatigue: Secondary | ICD-10-CM | POA: Insufficient documentation

## 2022-09-30 DIAGNOSIS — D492 Neoplasm of unspecified behavior of bone, soft tissue, and skin: Secondary | ICD-10-CM

## 2022-09-30 DIAGNOSIS — Z5112 Encounter for antineoplastic immunotherapy: Secondary | ICD-10-CM | POA: Insufficient documentation

## 2022-09-30 DIAGNOSIS — J90 Pleural effusion, not elsewhere classified: Secondary | ICD-10-CM | POA: Insufficient documentation

## 2022-09-30 DIAGNOSIS — J45909 Unspecified asthma, uncomplicated: Secondary | ICD-10-CM | POA: Insufficient documentation

## 2022-09-30 DIAGNOSIS — D6959 Other secondary thrombocytopenia: Secondary | ICD-10-CM | POA: Insufficient documentation

## 2022-09-30 DIAGNOSIS — D702 Other drug-induced agranulocytosis: Secondary | ICD-10-CM

## 2022-09-30 LAB — CBC WITH DIFFERENTIAL (CANCER CENTER ONLY)
Abs Immature Granulocytes: 0.01 10*3/uL (ref 0.00–0.07)
Basophils Absolute: 0 10*3/uL (ref 0.0–0.1)
Basophils Relative: 1 %
Eosinophils Absolute: 0.2 10*3/uL (ref 0.0–0.5)
Eosinophils Relative: 3 %
HCT: 37.8 % (ref 36.0–46.0)
Hemoglobin: 12.9 g/dL (ref 12.0–15.0)
Immature Granulocytes: 0 %
Lymphocytes Relative: 16 %
Lymphs Abs: 0.8 10*3/uL (ref 0.7–4.0)
MCH: 33.1 pg (ref 26.0–34.0)
MCHC: 34.1 g/dL (ref 30.0–36.0)
MCV: 96.9 fL (ref 80.0–100.0)
Monocytes Absolute: 0.4 10*3/uL (ref 0.1–1.0)
Monocytes Relative: 8 %
Neutro Abs: 3.7 10*3/uL (ref 1.7–7.7)
Neutrophils Relative %: 72 %
Platelet Count: 146 10*3/uL — ABNORMAL LOW (ref 150–400)
RBC: 3.9 MIL/uL (ref 3.87–5.11)
RDW: 13.1 % (ref 11.5–15.5)
WBC Count: 5 10*3/uL (ref 4.0–10.5)
nRBC: 0 % (ref 0.0–0.2)

## 2022-09-30 LAB — CMP (CANCER CENTER ONLY)
ALT: 13 U/L (ref 0–44)
AST: 20 U/L (ref 15–41)
Albumin: 4 g/dL (ref 3.5–5.0)
Alkaline Phosphatase: 95 U/L (ref 38–126)
Anion gap: 6 (ref 5–15)
BUN: 11 mg/dL (ref 6–20)
CO2: 30 mmol/L (ref 22–32)
Calcium: 9.5 mg/dL (ref 8.9–10.3)
Chloride: 104 mmol/L (ref 98–111)
Creatinine: 0.55 mg/dL (ref 0.44–1.00)
GFR, Estimated: >60 mL/min (ref >60)
Glucose, Bld: 72 mg/dL (ref 70–99)
Potassium: 4.1 mmol/L (ref 3.5–5.1)
Sodium: 140 mmol/L (ref 135–145)
Total Bilirubin: 0.4 mg/dL (ref 0.3–1.2)
Total Protein: 6.9 g/dL (ref 6.5–8.1)

## 2022-09-30 LAB — TOTAL PROTEIN, URINE DIPSTICK: Protein, ur: NEGATIVE mg/dL

## 2022-09-30 MED ORDER — HEPARIN SOD (PORK) LOCK FLUSH 100 UNIT/ML IV SOLN
500.0000 [IU] | Freq: Once | INTRAVENOUS | Status: AC | PRN
  Administered 2022-09-30: 500 [IU]

## 2022-09-30 MED ORDER — SODIUM CHLORIDE 0.9 % IV SOLN: Freq: Once | INTRAVENOUS | Status: AC

## 2022-09-30 MED ORDER — SODIUM CHLORIDE 0.9% FLUSH
10.0000 mL | Freq: Once | INTRAVENOUS | Status: AC
  Administered 2022-09-30: 10 mL

## 2022-09-30 MED ORDER — SODIUM CHLORIDE 0.9% FLUSH
10.0000 mL | INTRAVENOUS | Status: DC | PRN
  Administered 2022-09-30: 10 mL

## 2022-09-30 MED ORDER — SODIUM CHLORIDE 0.9 % IV SOLN
5.0000 mg/kg | Freq: Once | INTRAVENOUS | Status: AC
  Administered 2022-09-30: 350 mg via INTRAVENOUS
  Filled 2022-09-30: qty 14

## 2022-10-09 ENCOUNTER — Other Ambulatory Visit (HOSPITAL_COMMUNITY): Payer: Self-pay

## 2022-10-14 ENCOUNTER — Inpatient Hospital Stay: Payer: BC Managed Care – PPO

## 2022-10-14 ENCOUNTER — Inpatient Hospital Stay (HOSPITAL_BASED_OUTPATIENT_CLINIC_OR_DEPARTMENT_OTHER): Payer: BC Managed Care – PPO | Admitting: Internal Medicine

## 2022-10-14 ENCOUNTER — Encounter: Payer: Self-pay | Admitting: Internal Medicine

## 2022-10-14 ENCOUNTER — Other Ambulatory Visit: Payer: BC Managed Care – PPO

## 2022-10-14 VITALS — BP 147/78 | HR 102 | Temp 98.2°F | Resp 18

## 2022-10-14 VITALS — BP 144/84 | HR 115 | Temp 98.1°F | Resp 17 | Ht 66.0 in | Wt 156.9 lb

## 2022-10-14 DIAGNOSIS — C3431 Malignant neoplasm of lower lobe, right bronchus or lung: Secondary | ICD-10-CM | POA: Diagnosis not present

## 2022-10-14 DIAGNOSIS — D492 Neoplasm of unspecified behavior of bone, soft tissue, and skin: Secondary | ICD-10-CM

## 2022-10-14 DIAGNOSIS — Z95828 Presence of other vascular implants and grafts: Secondary | ICD-10-CM

## 2022-10-14 DIAGNOSIS — D702 Other drug-induced agranulocytosis: Secondary | ICD-10-CM

## 2022-10-14 LAB — CBC WITH DIFFERENTIAL (CANCER CENTER ONLY)
Abs Immature Granulocytes: 0.01 10*3/uL (ref 0.00–0.07)
Basophils Absolute: 0 10*3/uL (ref 0.0–0.1)
Basophils Relative: 0 %
Eosinophils Absolute: 0.1 10*3/uL (ref 0.0–0.5)
Eosinophils Relative: 2 %
HCT: 35.7 % — ABNORMAL LOW (ref 36.0–46.0)
Hemoglobin: 12 g/dL (ref 12.0–15.0)
Immature Granulocytes: 0 %
Lymphocytes Relative: 8 %
Lymphs Abs: 0.6 10*3/uL — ABNORMAL LOW (ref 0.7–4.0)
MCH: 32.4 pg (ref 26.0–34.0)
MCHC: 33.6 g/dL (ref 30.0–36.0)
MCV: 96.5 fL (ref 80.0–100.0)
Monocytes Absolute: 0.4 10*3/uL (ref 0.1–1.0)
Monocytes Relative: 6 %
Neutro Abs: 5.7 10*3/uL (ref 1.7–7.7)
Neutrophils Relative %: 84 %
Platelet Count: 145 10*3/uL — ABNORMAL LOW (ref 150–400)
RBC: 3.7 MIL/uL — ABNORMAL LOW (ref 3.87–5.11)
RDW: 12.8 % (ref 11.5–15.5)
WBC Count: 6.8 10*3/uL (ref 4.0–10.5)
nRBC: 0 % (ref 0.0–0.2)

## 2022-10-14 LAB — TOTAL PROTEIN, URINE DIPSTICK: Protein, ur: NEGATIVE mg/dL

## 2022-10-14 MED ORDER — SODIUM CHLORIDE 0.9% FLUSH
10.0000 mL | Freq: Once | INTRAVENOUS | Status: AC
  Administered 2022-10-14: 10 mL

## 2022-10-14 MED ORDER — SODIUM CHLORIDE 0.9 % IV SOLN
5.0000 mg/kg | Freq: Once | INTRAVENOUS | Status: AC
  Administered 2022-10-14: 350 mg via INTRAVENOUS
  Filled 2022-10-14: qty 14

## 2022-10-14 MED ORDER — SODIUM CHLORIDE 0.9 % IV SOLN: Freq: Once | INTRAVENOUS | Status: AC

## 2022-10-14 MED ORDER — SODIUM CHLORIDE 0.9% FLUSH
10.0000 mL | INTRAVENOUS | Status: DC | PRN
  Administered 2022-10-14: 10 mL

## 2022-10-14 MED ORDER — HEPARIN SOD (PORK) LOCK FLUSH 100 UNIT/ML IV SOLN
500.0000 [IU] | Freq: Once | INTRAVENOUS | Status: AC | PRN
  Administered 2022-10-14: 500 [IU]

## 2022-10-14 NOTE — Progress Notes (Signed)
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  PRIOR THERAPY:  1) Status post right Pleurx catheter placement for drainage of recurrent right pleural effusion. 2) repeat biopsy on August 11, 2018 of the lung mass at [facility] and it showed atypical spindle cell proliferation. 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 4) restaging CT scan of the chest on September 14, 2019 showed new 1.5 cm soft tissue lesion along the right subpulmonic diaphragmatic pleural surface worrisome for tumor recurrence.  She was followed by observation by Dr. Angelina Ok at [facility] 5) SBRT to the right lower lobe/diaphragmatic lesion completed June 22, 2020. 6) restaging scan on November 30, 2020 showed new right posterior pleural-based nodule followed by observation.  But repeat CT scan of the chest on 02/07/2021 showed enlarging right pleural mass/nodules. 7) the patient is started treatment with pazopanib on 02/17/2021 400 mg titrated to 800 mg p.o. daily but her treatment was held between July 8 to March 05, 2021 secondary to fatigue and arthralgia.  She resumed her treatment at a dose of 400 mg p.o. daily for 3 days titrated to 600 mg p.o. daily but this was again held in mid August 2022.  She resumed her treatment on 04/23/2021 at 400 mg p.o. daily but this was discontinued in October 2022 secondary to disease progression was a scan on 05/30/2021 revealing progressive multifocal pleural-based disease.  CURRENT  THERAPY: Temodar 150 Mg/M2 daily for days 1-7 and 15-21 every 4 weeks in addition to a Avastin 5 Mg/KG on days 8 and 22 every 4 weeks.  Started October 15, 2021.  Starting from cycle #3 her dose of Temodar was reduced to 140 mg once daily on days 1-7 and 15-21 every 4 weeks.  Status post 10 cycles of treatment.  Starting from cycle #4 her treatment was reduced to Temodar 140 Mg on days 1-7 and Avastin 5 Mg/KG on day 8 every 4 weeks based on her request secondary to intolerance.  She resumed day 22 of Avastin on cycle #9.  INTERVAL HISTORY: Brenda Patel 62 y.o. female returns to the clinic today for follow-up visit.  The patient is feeling fine today with no concerning complaints except for mild shortness of breath recently.  She also has some leaking from the Port-A-Cath site after the treatment.  She denied having any current chest pain, cough or hemoptysis.  She has no nausea, vomiting, diarrhea but has some constipation with Temodar.  She has no headache or visual changes.  She has no recent weight loss or night sweats.  He is here today for evaluation before starting day 8 of cycle #11.   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  MEDICATIONS:  Current Outpatient Medications  Medication Sig Dispense Refill   Calcium Citrate 333 MG  TABS Take 333 mg by mouth daily.     COVID-19 mRNA vaccine 2023-2024 (COMIRNATY) syringe Inject into the muscle. 0.3 mL 0   fluticasone (FLONASE) 50 MCG/ACT nasal spray Place 1 spray into both nostrils daily as needed for allergies.  5   IRON-VITAMIN C PO Take 1 tablet by mouth every other day.     meloxicam (MOBIC) 15 MG tablet Take 15 mg by mouth daily as needed for pain.      montelukast (SINGULAIR) 10 MG tablet Take 10 mg by mouth daily after  supper.  10   Olopatadine HCl (PATADAY OP) Place 1 drop into both eyes daily as needed (allergies).     ondansetron (ZOFRAN) 8 MG tablet Take 1 tablet (8 mg total) by mouth every 8 (eight) hours as needed for nausea or vomiting. (Patient taking differently: Take 8 mg by mouth See admin instructions. Take 8 mg 1 hour before taking Temodar, 7 days a month) 30 tablet 2   pseudoephedrine (SUDAFED) 120 MG 12 hr tablet Take 120 mg by mouth See admin instructions. Take 120 mg in the morning, may take a second 120 mg dose as needed for allergies     temozolomide (TEMODAR) 140 MG capsule Take 1 capsule (140 mg total) by mouth daily. Take on days 1-7 and days 15-21 of each 28 day cycle. May take on an empty stomach or at bedtime to decrease nausea & vomiting. 14 capsule 3   No current facility-administered medications for this visit.    SURGICAL HISTORY:  Past Surgical History:  Procedure Laterality Date   CHEST TUBE INSERTION Right 07/16/2018   Procedure: INSERTION PLEURAL DRAINAGE CATHETER, right;  Surgeon: Grace Isaac, MD;  Location: [facility];  Service: Thoracic;  Laterality: Right;   COLONOSCOPY  2017   GUM SURGERY  1984   GRAFTS   IR IMAGING GUIDED PORT INSERTION  10/02/2021   IR IMAGING GUIDED PORT INSERTION  07/31/2022   IR RADIOLOGIST EVAL & MGMT  10/08/2021   IR RADIOLOGIST EVAL & MGMT  10/11/2021   IR RADIOLOGIST EVAL & MGMT  04/17/2022   IR REMOVAL TUN ACCESS W/ PORT W/O FL MOD SED  10/15/2021   IR THORACENTESIS ASP PLEURAL SPACE W/IMG GUIDE  07/03/2018    REVIEW OF SYSTEMS:  A comprehensive review of systems was negative except for: Constitutional: positive for fatigue Respiratory: positive for dyspnea on exertion Gastrointestinal: positive for constipation   PHYSICAL EXAMINATION: General appearance: alert, cooperative, and no distress Head: Normocephalic, without obvious abnormality, atraumatic Neck: no adenopathy, no JVD, supple, symmetrical, trachea midline, and thyroid not enlarged,  symmetric, no tenderness/mass/nodules Lymph nodes: Cervical, supraclavicular, and axillary nodes normal. Resp: diminished breath sounds RLL and dullness to percussion RLL Back: symmetric, no curvature. ROM normal. No CVA tenderness. Cardio: regular rate and rhythm, S1, S2 normal, no murmur, click, rub or gallop GI: soft, non-tender; bowel sounds normal; no masses,  no organomegaly Extremities: extremities normal, atraumatic, no cyanosis or edema  ECOG PERFORMANCE STATUS: 1 - Symptomatic but completely ambulatory  Blood pressure (!) 144/84, pulse (!) 115, temperature 98.1 F (36.7 C), temperature source Temporal, resp. rate 17, height 5\' 6"  (1.676 m), weight 156 lb 14.4 oz (71.2 kg), SpO2 98 %.  LABORATORY DATA: Lab Results  Component Value Date   WBC 5.0 09/30/2022   HGB 12.9 09/30/2022   HCT 37.8 09/30/2022   MCV 96.9 09/30/2022   PLT 146 (L) 09/30/2022      Chemistry      Component  Value Date/Time   NA 140 09/30/2022 0947   K 4.1 09/30/2022 0947   CL 104 09/30/2022 0947   CO2 30 09/30/2022 0947   BUN 11 09/30/2022 0947   CREATININE 0.55 09/30/2022 0947      Component Value Date/Time   CALCIUM 9.5 09/30/2022 0947   ALKPHOS 95 09/30/2022 0947   AST 20 09/30/2022 0947   ALT 13 09/30/2022 0947   BILITOT 0.4 09/30/2022 0947       RADIOGRAPHIC STUDIES: No results found.   ASSESSMENT AND PLAN: This is a very pleasant 62 years old white female presented with large right lower lobe lung mass in addition to right pleural effusion.  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 The patient was referred to Dr. Angelina Ok at [facility] for second opinion and she underwent several studies and intervention as listed below 1) repeat biopsy on August 11, 2018 of the lung mass at [facility] and it showed atypical spindle cell proliferation. 2) on September 23, 2018 she underwent bilateral transexternal thoracotomies  for resection of the right pleural-based tumor with en bloc right lower lobe wedge resection and the pathology revealed 18 cm solitary fibrous tumor with negative margins. 3) restaging CT scan of the chest on September 14, 2019 showed new 1.5 cm soft tissue lesion along the right subpulmonic diaphragmatic pleural surface worrisome for tumor recurrence.  She was followed by observation by Dr. Angelina Ok at [facility] 4) SBRT to the right lower lobe/diaphragmatic lesion completed June 22, 2020. 5) restaging scan on November 30, 2020 showed new right posterior pleural-based nodule followed by observation.  But repeat CT scan of the chest on 02/07/2021 showed enlarging right pleural mass/nodules. 6) the patient is started treatment with pazopanib on 02/17/2021 400 mg titrated to 800 mg p.o. daily but her treatment was held between July 8 to March 05, 2021 secondary to fatigue and arthralgia.  She resumed her treatment at a dose of 400 mg p.o. daily for 3 days titrated to 600 mg p.o. daily but this was again held in mid August 2022.  She resumed her treatment on 04/23/2021 at 400 mg p.o. daily but this was discontinued in October 2022 secondary to disease progression was a scan on 05/30/2021 revealing progressive multifocal pleural-based disease. Dr. Angelina Ok recommended for the patient treatment with Temodar and Avastin.  She started Temodar 150 Mg/M2 on days 1 and 7 as well as day 15-21 in addition to Avastin 5 Mg/KG on days 1 and 22 every 4 weeks.  She is status post 2 cycles.   Her treatment was complicated with significant pancytopenia especially thrombocytopenia secondary to the treatment with Temodar.  It took her several weeks to recover from this adverse effects. She resumed her treatment recently with reduced dose Temodar 140 mg p.o. on days 1-7 and 15-22 and she is tolerating it much better.  Status post 10 cycle with reduced regimen.  Starting from cycle #4 she is on treatment with Temodar 140 Mg  on days 1-7 and Avastin 5 Mg/KG on day 8 every 4 weeks.  She resumed Avastin on days 8 and 22 starting from cycle #9. The patient has been tolerating her treatment well with no concerning adverse effects. I recommended for her to proceed with day 8 of cycle #11 today as planned. For the shortness of breath, will continue to monitor closely and if it is getting worse, may consider her for repeat chest x-ray to  rule out reaccumulation of the right pleural effusion or we may move her restaging scan to be done sooner which is supposed to be done at [facility] the end of next months. For the constipation she will continue on MiraLAX on as-needed basis. For the bleeding from the Port-A-Cath, this could be coming from her treatment with Avastin and she was advised to continue pressure back a little bit longer after this treatment. For the hypertension, we will continue to monitor her closely and consider The patient for treatment if she has persistently elevated hypertension at home. She was advised to call immediately if she has any other concerning symptoms in the interval. The patient voices understanding of current disease status and treatment options and is in agreement with the current care plan.  All questions were answered. The patient knows to call the clinic with any problems, questions or concerns. We can certainly see the patient much sooner if necessary.  The total time spent in the appointment was 30 minutes.  Disclaimer: This note was dictated with voice recognition software. Similar sounding words can inadvertently be transcribed and may not be corrected upon review.

## 2022-10-14 NOTE — Progress Notes (Signed)
Patient seen by MD today  Vitals are not all within treatment parameters.   Per Dr. Julien Nordmann , it is ok to treat pt today with bevacizumab-bvzr  and heart rate of 115.  Labs reviewed: and are not all within treatment parameters.    Per physician team, patient is ready for treatment and there are NO modifications to the treatment plan.

## 2022-10-14 NOTE — Patient Instructions (Signed)
[facility]  Discharge Instructions: Thank you for choosing [facility] to provide your oncology and hematology care.   If you have a lab appointment with the [facility], please go directly to the [facility] and check in at the registration area.   Wear comfortable clothing and clothing appropriate for easy access to any Portacath or PICC line.   We strive to give you quality time with your provider. You may need to reschedule your appointment if you arrive late (15 or more minutes).  Arriving late affects you and other patients whose appointments are after yours.  Also, if you miss three or more appointments without notifying the office, you may be dismissed from the clinic at the provider's discretion.      For prescription refill requests, have your pharmacy contact our office and allow 72 hours for refills to be completed.    Today you received the following chemotherapy and/or immunotherapy agent: Bevacizumab   To help prevent nausea and vomiting after your treatment, we encourage you to take your nausea medication as directed.  BELOW ARE SYMPTOMS THAT SHOULD BE REPORTED IMMEDIATELY: *FEVER GREATER THAN 100.4 F (38 C) OR HIGHER *CHILLS OR SWEATING *NAUSEA AND VOMITING THAT IS NOT CONTROLLED WITH YOUR NAUSEA MEDICATION *UNUSUAL SHORTNESS OF BREATH *UNUSUAL BRUISING OR BLEEDING *URINARY PROBLEMS (pain or burning when urinating, or frequent urination) *BOWEL PROBLEMS (unusual diarrhea, constipation, pain near the anus) TENDERNESS IN MOUTH AND THROAT WITH OR WITHOUT PRESENCE OF ULCERS (sore throat, sores in mouth, or a toothache) UNUSUAL RASH, SWELLING OR PAIN  UNUSUAL VAGINAL DISCHARGE OR ITCHING   Items with * indicate a potential emergency and should be followed up as soon as possible or go to the Emergency Department if any problems should occur.  Please show the CHEMOTHERAPY ALERT CARD or IMMUNOTHERAPY ALERT CARD at  check-in to the Emergency Department and triage nurse.  Should you have questions after your visit or need to cancel or reschedule your appointment, please contact [facility]  Dept: [phone]  and follow the prompts.  Office hours are 8:00 a.m. to 4:30 p.m. Monday - Friday. Please note that voicemails left after 4:00 p.m. may not be returned until the following business day.  We are closed weekends and major holidays. You have access to a nurse at all times for urgent questions. Please call the main number to the clinic Dept: [phone] and follow the prompts.   For any non-urgent questions, you may also contact your provider using MyChart. We now offer e-Visits for anyone 39 and older to request care online for non-urgent symptoms. For details visit [email or web address].   Also download the MyChart app! Go to the app store, search "MyChart", open the app, select [facility], and log in with your MyChart username and password.  Bevacizumab Injection What is this medication? BEVACIZUMAB (be va SIZ yoo mab) treats some types of cancer. It works by blocking a protein that causes cancer cells to grow and multiply. This helps to slow or stop the spread of cancer cells. It is a monoclonal antibody. This medicine may be used for other purposes; ask your health care provider or pharmacist if you have questions. COMMON BRAND NAME(S): Alymsys, Avastin, MVASI, Noah Charon What should I tell my care team before I take this medication? They need to know if you have any of these conditions: Blood clots Coughing up blood Having or recent surgery Heart failure High blood  pressure History of a connection between 2 or more body parts that do not usually connect (fistula) History of a tear in your stomach or intestines Protein in your urine An unusual or allergic reaction to bevacizumab, other medications, foods, dyes, or preservatives Pregnant or trying to get  pregnant Breast-feeding How should I use this medication? This medication is injected into a vein. It is given by your care team in a hospital or clinic setting. Talk to your care team the use of this medication in children. Special care may be needed. Overdosage: If you think you have taken too much of this medicine contact a poison control center or emergency room at once. NOTE: This medicine is only for you. Do not share this medicine with others. What if I miss a dose? Keep appointments for follow-up doses. It is important not to miss your dose. Call your care team if you are unable to keep an appointment. What may interact with this medication? Interactions are not expected. This list may not describe all possible interactions. Give your health care provider a list of all the medicines, herbs, non-prescription drugs, or dietary supplements you use. Also tell them if you smoke, drink alcohol, or use illegal drugs. Some items may interact with your medicine. What should I watch for while using this medication? Your condition will be monitored carefully while you are receiving this medication. You may need blood work while taking this medication. This medication may make you feel generally unwell. This is not uncommon as chemotherapy can affect healthy cells as well as cancer cells. Report any side effects. Continue your course of treatment even though you feel ill unless your care team tells you to stop. This medication may increase your risk to bruise or bleed. Call your care team if you notice any unusual bleeding. Before having surgery, talk to your care team to make sure it is ok. This medication can increase the risk of poor healing of your surgical site or wound. You will need to stop this medication for 28 days before surgery. After surgery, wait at least 28 days before restarting this medication. Make sure the surgical site or wound is healed enough before restarting this medication. Talk  to your care team if questions. Talk to your care team if you may be pregnant. Serious birth defects can occur if you take this medication during pregnancy and for 6 months after the last dose. Contraception is recommended while taking this medication and for 6 months after the last dose. Your care team can help you find the option that works for you. Do not breastfeed while taking this medication and for 6 months after the last dose. This medication can cause infertility. Talk to your care team if you are concerned about your fertility. What side effects may I notice from receiving this medication? Side effects that you should report to your care team as soon as possible: Allergic reactions--skin rash, itching, hives, swelling of the face, lips, tongue, or throat Bleeding--bloody or black, tar-like stools, vomiting blood or brown material that looks like coffee grounds, red or dark brown urine, small red or purple spots on skin, unusual bruising or bleeding Blood clot--pain, swelling, or warmth in the leg, shortness of breath, chest pain Heart attack--pain or tightness in the chest, shoulders, arms, or jaw, nausea, shortness of breath, cold or clammy skin, feeling faint or lightheaded Heart failure--shortness of breath, swelling of the ankles, feet, or hands, sudden weight gain, unusual weakness or fatigue Increase in  blood pressure Infection--fever, chills, cough, sore throat, wounds that don't heal, pain or trouble when passing urine, general feeling of discomfort or being unwell Infusion reactions--chest pain, shortness of breath or trouble breathing, feeling faint or lightheaded Kidney injury--decrease in the amount of urine, swelling of the ankles, hands, or feet Stomach pain that is severe, does not go away, or gets worse Stroke--sudden numbness or weakness of the face, arm, or leg, trouble speaking, confusion, trouble walking, loss of balance or coordination, dizziness, severe headache,  change in vision Sudden and severe headache, confusion, change in vision, seizures, which may be signs of posterior reversible encephalopathy syndrome (PRES) Side effects that usually do not require medical attention (report to your care team if they continue or are bothersome): Back pain Change in taste Diarrhea Dry skin Increased tears Nosebleed This list may not describe all possible side effects. Call your doctor for medical advice about side effects. You may report side effects to FDA at 1-800-FDA-1088. Where should I keep my medication? This medication is given in a hospital or clinic. It will not be stored at home. NOTE: This sheet is a summary. It may not cover all possible information. If you have questions about this medicine, talk to your doctor, pharmacist, or health care provider.  2023 Elsevier/Gold Standard (2021-12-14 00:00:00)

## 2022-10-16 ENCOUNTER — Ambulatory Visit (HOSPITAL_COMMUNITY)
Admission: RE | Admit: 2022-10-16 | Discharge: 2022-10-16 | Disposition: A | Discharge status: Home / Self Care | Payer: BC Managed Care – PPO | Source: Ambulatory Visit | Attending: Internal Medicine | Admitting: Internal Medicine

## 2022-10-16 ENCOUNTER — Other Ambulatory Visit: Payer: Self-pay | Admitting: Medical Oncology

## 2022-10-16 ENCOUNTER — Other Ambulatory Visit: Payer: Self-pay

## 2022-10-16 ENCOUNTER — Telehealth: Payer: Self-pay | Admitting: Internal Medicine

## 2022-10-16 ENCOUNTER — Telehealth: Payer: Self-pay | Admitting: Medical Oncology

## 2022-10-16 DIAGNOSIS — C3491 Malignant neoplasm of unspecified part of right bronchus or lung: Secondary | ICD-10-CM

## 2022-10-16 NOTE — Telephone Encounter (Signed)
Scheduled per 02/20 work-queue, patient has been called and notified of upcoming appointments.

## 2022-10-16 NOTE — Telephone Encounter (Signed)
CXR ordered and pt notified.

## 2022-10-16 NOTE — Telephone Encounter (Signed)
Feels more SOB. Coughing.

## 2022-10-18 ENCOUNTER — Telehealth: Payer: Self-pay | Admitting: *Deleted

## 2022-10-18 ENCOUNTER — Other Ambulatory Visit: Payer: Self-pay | Admitting: Physician Assistant

## 2022-10-18 DIAGNOSIS — M25471 Effusion, right ankle: Secondary | ICD-10-CM

## 2022-10-18 NOTE — Telephone Encounter (Signed)
-----   Message from [employer], PA-C sent at 10/18/2022 10:00 AM EST ----- Regarding: RE: CXR results I will order a thoracentesis if you can help get it scheduled? I read Dr. Worthy Flank last note, when you talk to her, Dr. Worthy Flank note did mention that [facility] may want to consider moving her restaging CT scan sooner if she has a lot of fluid. I don't know when it is scheduled for next month but I think she should call her [facility] provider and ask them if that should be moved up sooner. Can you also let her know that some of her fluid is loculated, meaning sometimes that makes it harder to drain. I don't know how much they will be able to get but if she is symptomatic it is worth a try.  ----- Message ----- From: Rolene Course, RN Sent: 10/18/2022   9:48 AM EST To: Tobe Sos Heilingoetter, PA-C Subject: CXR results                                    Cassie, this patient called asking about her CXR results from 2/21, she is wondering if she may need thoracentesis.  The results actually weren't back but I called the reading room & results are in now.  Can you look at this please?  Thanks, Bethena Roys

## 2022-10-18 NOTE — Telephone Encounter (Signed)
PC to patient, informed her of C. Heilingoetter's message as below.  Thoracentesis is scheduled at [facility] for Monday, 10/21/22 at 11:00, she is to arrive at 10:30. Informed patient that some of her pleural fluids is loculated & this may be more difficult to drain.  Also informed patient of Cassie's suggestion do inform [facility] about her CXR results in case they want to schedule her CT earlier.  Patient verbalizes understanding.

## 2022-10-21 ENCOUNTER — Ambulatory Visit (HOSPITAL_COMMUNITY)
Admission: RE | Admit: 2022-10-21 | Discharge: 2022-10-21 | Disposition: A | Discharge status: Home / Self Care | Payer: BC Managed Care – PPO | Source: Ambulatory Visit | Attending: Physician Assistant | Admitting: Physician Assistant

## 2022-10-21 ENCOUNTER — Other Ambulatory Visit: Payer: Self-pay | Admitting: Physician Assistant

## 2022-10-21 DIAGNOSIS — M25471 Effusion, right ankle: Secondary | ICD-10-CM | POA: Insufficient documentation

## 2022-10-21 DIAGNOSIS — R0609 Other forms of dyspnea: Secondary | ICD-10-CM | POA: Diagnosis not present

## 2022-10-21 DIAGNOSIS — C3431 Malignant neoplasm of lower lobe, right bronchus or lung: Secondary | ICD-10-CM | POA: Diagnosis not present

## 2022-10-21 MED ORDER — LIDOCAINE HCL 1 % IJ SOLN
INTRAMUSCULAR | Status: AC
  Filled 2022-10-21: qty 20

## 2022-10-21 NOTE — Progress Notes (Signed)
Patient ID: Brenda Patel, female   DOB: Jun 03, 1961, 62 y.o.   MRN: [id number] Pt presented to [place] dept today for therapeutic right thoracentesis. On limited US rt post chest there is only a trace amount of pleural fluid (primarily tumor) which is not amenable to safely tap. Procedure cancelled. Pt informed. Past images were reviewed by Dr. Earleen Newport.

## 2022-10-22 ENCOUNTER — Ambulatory Visit (HOSPITAL_COMMUNITY)
Admission: RE | Admit: 2022-10-22 | Discharge: 2022-10-22 | Disposition: A | Discharge status: Home / Self Care | Payer: BC Managed Care – PPO | Source: Ambulatory Visit | Attending: Internal Medicine | Admitting: Internal Medicine

## 2022-10-22 ENCOUNTER — Ambulatory Visit (HOSPITAL_BASED_OUTPATIENT_CLINIC_OR_DEPARTMENT_OTHER): Payer: BC Managed Care – PPO

## 2022-10-22 ENCOUNTER — Telehealth: Payer: Self-pay | Admitting: Medical Oncology

## 2022-10-22 ENCOUNTER — Other Ambulatory Visit: Payer: Self-pay | Admitting: Medical Oncology

## 2022-10-22 ENCOUNTER — Other Ambulatory Visit: Payer: Self-pay | Admitting: Internal Medicine

## 2022-10-22 DIAGNOSIS — C3491 Malignant neoplasm of unspecified part of right bronchus or lung: Secondary | ICD-10-CM

## 2022-10-22 DIAGNOSIS — R0689 Other abnormalities of breathing: Secondary | ICD-10-CM

## 2022-10-22 MED ORDER — HEPARIN SOD (PORK) LOCK FLUSH 100 UNIT/ML IV SOLN
500.0000 [IU] | Freq: Once | INTRAVENOUS | Status: AC
  Administered 2022-10-22: 500 [IU] via INTRAVENOUS

## 2022-10-22 MED ORDER — IOHEXOL 350 MG/ML SOLN
75.0000 mL | Freq: Once | INTRAVENOUS | Status: AC | PRN
  Administered 2022-10-22: 75 mL via INTRAVENOUS

## 2022-10-22 MED ORDER — SODIUM CHLORIDE (PF) 0.9 % IJ SOLN
INTRAMUSCULAR | Status: AC
  Filled 2022-10-22: qty 50

## 2022-10-22 MED ORDER — HEPARIN SOD (PORK) LOCK FLUSH 100 UNIT/ML IV SOLN
INTRAVENOUS | Status: AC
  Filled 2022-10-22: qty 5

## 2022-10-22 NOTE — Progress Notes (Signed)
Radiology contacted to schedule stat CT angio.appt today at 1430 -pt notified.

## 2022-10-22 NOTE — Addendum Note (Signed)
Addended by: Ardeen Garland on: 10/22/2022 12:56 PM   Modules accepted: Orders

## 2022-10-22 NOTE — Telephone Encounter (Addendum)
"   Difficulty breathing " SOB and it increases with talking and  moving around the house . Low grade fevers on and off since January. Home Pulse ox =96% -98%. She is going to get mucinex because she thinks part of her SOB is related to thick mucous.  Per Dr Julien Nordmann he wants pt to get a CT angio. Order placed.

## 2022-10-22 NOTE — Telephone Encounter (Signed)
Pt notified of CT angio results. I contacted Dr Brenda Patel to look at her scan today and compare with [place] yesterday. Pt aware of Ct angio result and I told her to go home and will call her back with update.

## 2022-10-23 ENCOUNTER — Emergency Department (HOSPITAL_COMMUNITY): Payer: BC Managed Care – PPO

## 2022-10-23 ENCOUNTER — Inpatient Hospital Stay (HOSPITAL_COMMUNITY)
Admission: EM | Admit: 2022-10-23 | Discharge: 2022-10-28 | DRG: 181 | Disposition: A | Discharge status: Hospice | Payer: BC Managed Care – PPO | Attending: Family Medicine | Admitting: Family Medicine

## 2022-10-23 ENCOUNTER — Other Ambulatory Visit: Payer: Self-pay

## 2022-10-23 ENCOUNTER — Inpatient Hospital Stay (HOSPITAL_COMMUNITY): Payer: BC Managed Care – PPO

## 2022-10-23 ENCOUNTER — Telehealth: Payer: Self-pay | Admitting: Medical Oncology

## 2022-10-23 ENCOUNTER — Encounter (HOSPITAL_COMMUNITY): Payer: Self-pay

## 2022-10-23 DIAGNOSIS — Z515 Encounter for palliative care: Secondary | ICD-10-CM | POA: Diagnosis not present

## 2022-10-23 DIAGNOSIS — J90 Pleural effusion, not elsewhere classified: Secondary | ICD-10-CM | POA: Diagnosis not present

## 2022-10-23 DIAGNOSIS — R0609 Other forms of dyspnea: Secondary | ICD-10-CM | POA: Diagnosis present

## 2022-10-23 DIAGNOSIS — Z888 Allergy status to other drugs, medicaments and biological substances status: Secondary | ICD-10-CM

## 2022-10-23 DIAGNOSIS — D860 Sarcoidosis of lung: Secondary | ICD-10-CM | POA: Diagnosis present

## 2022-10-23 DIAGNOSIS — Z9221 Personal history of antineoplastic chemotherapy: Secondary | ICD-10-CM | POA: Diagnosis not present

## 2022-10-23 DIAGNOSIS — C3431 Malignant neoplasm of lower lobe, right bronchus or lung: Principal | ICD-10-CM | POA: Diagnosis present

## 2022-10-23 DIAGNOSIS — G4700 Insomnia, unspecified: Secondary | ICD-10-CM

## 2022-10-23 DIAGNOSIS — Z885 Allergy status to narcotic agent status: Secondary | ICD-10-CM

## 2022-10-23 DIAGNOSIS — G893 Neoplasm related pain (acute) (chronic): Secondary | ICD-10-CM | POA: Diagnosis present

## 2022-10-23 DIAGNOSIS — Z91048 Other nonmedicinal substance allergy status: Secondary | ICD-10-CM

## 2022-10-23 DIAGNOSIS — Z7189 Other specified counseling: Secondary | ICD-10-CM | POA: Diagnosis not present

## 2022-10-23 DIAGNOSIS — Z791 Long term (current) use of non-steroidal anti-inflammatories (NSAID): Secondary | ICD-10-CM | POA: Diagnosis not present

## 2022-10-23 DIAGNOSIS — R0602 Shortness of breath: Secondary | ICD-10-CM | POA: Diagnosis not present

## 2022-10-23 DIAGNOSIS — C384 Malignant neoplasm of pleura: Secondary | ICD-10-CM | POA: Diagnosis present

## 2022-10-23 DIAGNOSIS — R4589 Other symptoms and signs involving emotional state: Secondary | ICD-10-CM | POA: Diagnosis not present

## 2022-10-23 DIAGNOSIS — F419 Anxiety disorder, unspecified: Secondary | ICD-10-CM | POA: Diagnosis present

## 2022-10-23 DIAGNOSIS — R030 Elevated blood-pressure reading, without diagnosis of hypertension: Secondary | ICD-10-CM | POA: Diagnosis present

## 2022-10-23 DIAGNOSIS — Z79899 Other long term (current) drug therapy: Secondary | ICD-10-CM

## 2022-10-23 DIAGNOSIS — J45909 Unspecified asthma, uncomplicated: Secondary | ICD-10-CM | POA: Diagnosis present

## 2022-10-23 DIAGNOSIS — J910 Malignant pleural effusion: Secondary | ICD-10-CM | POA: Diagnosis present

## 2022-10-23 DIAGNOSIS — K5900 Constipation, unspecified: Secondary | ICD-10-CM | POA: Diagnosis present

## 2022-10-23 DIAGNOSIS — C3491 Malignant neoplasm of unspecified part of right bronchus or lung: Secondary | ICD-10-CM

## 2022-10-23 DIAGNOSIS — Z66 Do not resuscitate: Secondary | ICD-10-CM | POA: Diagnosis present

## 2022-10-23 DIAGNOSIS — R451 Restlessness and agitation: Secondary | ICD-10-CM | POA: Diagnosis not present

## 2022-10-23 DIAGNOSIS — Z91018 Allergy to other foods: Secondary | ICD-10-CM | POA: Diagnosis not present

## 2022-10-23 DIAGNOSIS — Z923 Personal history of irradiation: Secondary | ICD-10-CM

## 2022-10-23 LAB — BASIC METABOLIC PANEL
Anion gap: 8 (ref 5–15)
BUN: 8 mg/dL (ref 6–20)
CO2: 26 mmol/L (ref 22–32)
Calcium: 8.3 mg/dL — ABNORMAL LOW (ref 8.9–10.3)
Chloride: 100 mmol/L (ref 98–111)
Creatinine, Ser: 0.56 mg/dL (ref 0.44–1.00)
GFR, Estimated: >60 mL/min (ref >60)
Glucose, Bld: 95 mg/dL (ref 70–99)
Potassium: 3.4 mmol/L — ABNORMAL LOW (ref 3.5–5.1)
Sodium: 134 mmol/L — ABNORMAL LOW (ref 135–145)

## 2022-10-23 LAB — CBC WITH DIFFERENTIAL/PLATELET
Abs Immature Granulocytes: 0.02 10*3/uL (ref 0.00–0.07)
Basophils Absolute: 0 10*3/uL (ref 0.0–0.1)
Basophils Relative: 0 %
Eosinophils Absolute: 0.1 10*3/uL (ref 0.0–0.5)
Eosinophils Relative: 1 %
HCT: 34.2 % — ABNORMAL LOW (ref 36.0–46.0)
Hemoglobin: 11.3 g/dL — ABNORMAL LOW (ref 12.0–15.0)
Immature Granulocytes: 0 %
Lymphocytes Relative: 7 %
Lymphs Abs: 0.5 10*3/uL — ABNORMAL LOW (ref 0.7–4.0)
MCH: 32.6 pg (ref 26.0–34.0)
MCHC: 33 g/dL (ref 30.0–36.0)
MCV: 98.6 fL (ref 80.0–100.0)
Monocytes Absolute: 0.5 10*3/uL (ref 0.1–1.0)
Monocytes Relative: 7 %
Neutro Abs: 6.1 10*3/uL (ref 1.7–7.7)
Neutrophils Relative %: 85 %
Platelets: 129 10*3/uL — ABNORMAL LOW (ref 150–400)
RBC: 3.47 MIL/uL — ABNORMAL LOW (ref 3.87–5.11)
RDW: 13.2 % (ref 11.5–15.5)
WBC: 7.2 10*3/uL (ref 4.0–10.5)
nRBC: 0 % (ref 0.0–0.2)

## 2022-10-23 MED ORDER — LACTATED RINGERS IV SOLN: INTRAVENOUS | Status: DC

## 2022-10-23 MED ORDER — DOCUSATE SODIUM 100 MG PO CAPS
100.0000 mg | ORAL_CAPSULE | Freq: Two times a day (BID) | ORAL | Status: DC
  Filled 2022-10-23 (×3): qty 1

## 2022-10-23 MED ORDER — ACETAMINOPHEN 650 MG RE SUPP: 650.0000 mg | Freq: Four times a day (QID) | RECTAL | Status: DC | PRN

## 2022-10-23 MED ORDER — BISACODYL 5 MG PO TBEC: 5.0000 mg | DELAYED_RELEASE_TABLET | Freq: Every day | ORAL | Status: DC | PRN

## 2022-10-23 MED ORDER — ALBUTEROL SULFATE (2.5 MG/3ML) 0.083% IN NEBU
2.5000 mg | INHALATION_SOLUTION | RESPIRATORY_TRACT | Status: DC | PRN
  Administered 2022-10-24: 2.5 mg via RESPIRATORY_TRACT
  Filled 2022-10-23: qty 3

## 2022-10-23 MED ORDER — MONTELUKAST SODIUM 10 MG PO TABS
10.0000 mg | ORAL_TABLET | Freq: Every day | ORAL | Status: DC
  Administered 2022-10-23 – 2022-10-28 (×6): 10 mg via ORAL
  Filled 2022-10-23 (×7): qty 1

## 2022-10-23 MED ORDER — ACETAMINOPHEN 325 MG PO TABS
650.0000 mg | ORAL_TABLET | Freq: Four times a day (QID) | ORAL | Status: DC | PRN
  Administered 2022-10-23: 650 mg via ORAL
  Filled 2022-10-23 (×2): qty 2

## 2022-10-23 MED ORDER — ONDANSETRON HCL 4 MG PO TABS: 4.0000 mg | ORAL_TABLET | Freq: Four times a day (QID) | ORAL | Status: DC | PRN

## 2022-10-23 MED ORDER — ONDANSETRON HCL 4 MG/2ML IJ SOLN
4.0000 mg | Freq: Four times a day (QID) | INTRAMUSCULAR | Status: DC | PRN
  Administered 2022-10-25: 4 mg via INTRAVENOUS
  Filled 2022-10-23: qty 2

## 2022-10-23 MED ORDER — HYDRALAZINE HCL 20 MG/ML IJ SOLN: 5.0000 mg | INTRAMUSCULAR | Status: DC | PRN

## 2022-10-23 MED ORDER — OXYCODONE HCL 5 MG PO TABS: 5.0000 mg | ORAL_TABLET | ORAL | Status: DC | PRN

## 2022-10-23 MED ORDER — POLYETHYLENE GLYCOL 3350 17 G PO PACK: 17.0000 g | PACK | Freq: Every day | ORAL | Status: DC | PRN

## 2022-10-23 MED ORDER — MORPHINE SULFATE (PF) 2 MG/ML IV SOLN: 2.0000 mg | INTRAVENOUS | Status: DC | PRN

## 2022-10-23 MED ORDER — SODIUM CHLORIDE 0.9% FLUSH
3.0000 mL | Freq: Two times a day (BID) | INTRAVENOUS | Status: DC
  Administered 2022-10-25 – 2022-10-28 (×6): 3 mL via INTRAVENOUS

## 2022-10-23 NOTE — Telephone Encounter (Signed)
Pt notified of mass, not fluid  in chest. She is struggling to breath and breathing sounds labored. I called EMS to pick up pt. and take her to [facility]. Pt aware.

## 2022-10-23 NOTE — ED Triage Notes (Addendum)
Pt coming from home via EMS with c/o SOB x1 week and worsening this morning. Pt also c/o worsening cough x1 week that patient has reportedly had since January. Per pt, had CT scan yesterday that confirmed a right lung mass.

## 2022-10-23 NOTE — ED Provider Notes (Signed)
[facility] EMERGENCY DEPARTMENT AT [facility] Provider Note   CSN: [id number] Arrival date & time: 10/23/22  [id number]     History  Chief Complaint  Patient presents with   Shortness of Breath    Brenda Patel is a 62 y.o. female.  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  She is followed by Dr. Earlie Server with oncology.  Reportedly has been having worsening shortness of breath especially with exertion.  She was seen on the 26th by IR for possible thoracentesis but ultrasound examination revealed trace amount of pleural fluid not amenable to thoracentesis.  She then had a CTA PE study done yesterday which revealed negative PE but large right pleural effusion significant increase in volume compared to prior exam and possibly symptomatic due to large size with multiple pleural masses similar to prior examination.  She was on the phone with the nurse today and was sounding more tachypneic and short of breath and they called EMS for her.  She reports feeling short of breath with just talking.  Denies any fevers or chills.  She is anxious and states that she is not ready to be "circling the drain".    Home Medications Prior to Admission medications   Medication Sig Start Date End Date Taking? Authorizing Provider  Calcium Citrate 333 MG TABS Take 333 mg by mouth daily.    [provider]  COVID-19 mRNA vaccine [phone] (COMIRNATY) syringe Inject into the muscle. 07/05/22     fluticasone (FLONASE) 50 MCG/ACT nasal spray Place 1 spray into both nostrils daily as needed for allergies. 05/05/18   [provider]  IRON-VITAMIN C PO Take 1 tablet by mouth every other day.    [provider]  meloxicam (MOBIC) 15 MG tablet Take 15 mg by mouth daily as needed for pain.  10/13/19   [provider]  montelukast (SINGULAIR) 10 MG tablet Take 10 mg by  mouth daily after supper. 06/07/18   [provider]  Olopatadine HCl (PATADAY OP) Place 1 drop into both eyes daily as needed (allergies).    [provider]  ondansetron (ZOFRAN) 8 MG tablet Take 1 tablet (8 mg total) by mouth every 8 (eight) hours as needed for nausea or vomiting. Patient taking differently: Take 8 mg by mouth See admin instructions. Take 8 mg 1 hour before taking Temodar, 7 days a month 03/11/22   Heilingoetter, Cassandra L, PA-C  pseudoephedrine (SUDAFED) 120 MG 12 hr tablet Take 120 mg by mouth See admin instructions. Take 120 mg in the morning, may take a second 120 mg dose as needed for allergies    [provider]  temozolomide (TEMODAR) 140 MG capsule Take 1 capsule (140 mg total) by mouth daily. Take on days 1-7 and days 15-21 of each 28 day cycle. May take on an empty stomach or at bedtime to decrease nausea & vomiting. 09/16/22   Curt Bears, MD      Allergies    Cyanoacrylate, Other, Wound dressing adhesive, Codeine, and Lentil    Review of Systems   Review of Systems Ten systems reviewed and are negative for acute change, except as noted in the HPI.   Physical Exam Updated Vital Signs BP (!) 178/88   Pulse (!) 102   Temp 98 F (36.7 C) (Oral)   Resp (!) 25   Ht '5\' 6"'$  (1.676 m)  Wt 68 kg   SpO2 100%   BMI 24.21 kg/m  Physical Exam Vitals and nursing note reviewed.  Constitutional:      General: She is not in acute distress.    Appearance: She is well-developed.     Comments: Anxious appearing   HENT:     Head: Normocephalic and atraumatic.  Eyes:     Conjunctiva/sclera: Conjunctivae normal.  Cardiovascular:     Rate and Rhythm: Normal rate and regular rhythm.     Heart sounds: No murmur heard. Pulmonary:     Effort: Pulmonary effort is normal. No respiratory distress.     Breath sounds: Examination of the right-upper field reveals decreased breath sounds. Examination of the right-lower field reveals decreased  breath sounds. Decreased breath sounds present.  Abdominal:     Palpations: Abdomen is soft.     Tenderness: There is no abdominal tenderness.  Musculoskeletal:        General: No swelling.     Cervical back: Neck supple.  Skin:    General: Skin is warm and dry.     Capillary Refill: Capillary refill takes less than 2 seconds.  Neurological:     Mental Status: She is alert.  Psychiatric:        Mood and Affect: Mood normal.     ED Results / Procedures / Treatments   Labs (all labs ordered are listed, but only abnormal results are displayed) Labs Reviewed  BASIC METABOLIC PANEL - Abnormal; Notable for the following components:      Result Value   Sodium 134 (*)    Potassium 3.4 (*)    Calcium 8.3 (*)    All other components within normal limits  CBC WITH DIFFERENTIAL/PLATELET - Abnormal; Notable for the following components:   RBC 3.47 (*)    Hemoglobin 11.3 (*)    HCT 34.2 (*)    Platelets 129 (*)    Lymphs Abs 0.5 (*)    All other components within normal limits    EKG None  Radiology DG Chest Portable 1 View  Result Date: 10/23/2022 CLINICAL DATA:  Shortness of breath. EXAM: PORTABLE CHEST 1 VIEW COMPARISON:  October 16, 2022.  October 22, 2022. FINDINGS: Stable cardiomediastinal silhouette. Right internal jugular Port-A-Cath is noted. Left lung is clear. Large rounded density is noted in the right lung most consistent with pleural effusion as noted on prior CT scan. Some degree of right basilar atelectasis is present as well. IMPRESSION: Large rounded density is again noted in the right lung most consistent with pleural effusion is noted on prior CT scan. Electronically Signed   By: Marijo Conception M.D.   On: 10/23/2022 09:43   CT Angio Chest Pulmonary Embolism (PE) W or WO Contrast  Result Date: 10/22/2022 CLINICAL DATA:  PE suspected, shortness of breath for 1 week, history of lung cancer * Tracking Code: BO * EXAM: CT ANGIOGRAPHY CHEST WITH CONTRAST TECHNIQUE:  Multidetector CT imaging of the chest was performed using the standard protocol during bolus administration of intravenous contrast. Multiplanar CT image reconstructions and MIPs were obtained to evaluate the vascular anatomy. RADIATION DOSE REDUCTION: This exam was performed according to the departmental dose-optimization program which includes automated exposure control, adjustment of the mA and/or kV according to patient size and/or use of iterative reconstruction technique. CONTRAST:  78m OMNIPAQUE IOHEXOL 350 MG/ML SOLN COMPARISON:  09/09/2022 FINDINGS: Cardiovascular: Satisfactory opacification of the pulmonary arteries to the segmental level. No evidence of pulmonary embolism. Normal heart size. No  pericardial effusion. Right chest port catheter. Mediastinum/Nodes: No enlarged mediastinal, hilar, or axillary lymph nodes. Thyroid gland, trachea, and esophagus demonstrate no significant findings. Lungs/Pleura: Large right pleural effusion, significantly increased in volume compared to prior examination. Multiple pleural masses and nodules, similar to prior examination, index mass in the right lung base measuring 5.0 x 3.6 cm (series 7, image 77). Some nodules may be slightly enlarged compared to prior examination, for example anteriorly measuring 2.8 x 1.8 cm, previously 2.5 x 1.4 cm (series 7, image 27). Left lung is normally aerated. Upper Abdomen: No acute abnormality. Musculoskeletal: No chest wall abnormality. No acute osseous findings. Review of the MIP images confirms the above findings. IMPRESSION: 1. Negative examination for pulmonary embolism. 2. Large right pleural effusion, significantly increased in volume compared to prior examination and possibly symptomatic due to large size. 3. Multiple pleural masses and nodules, similar to prior examination, although some smaller nodules may be slightly enlarged compared to prior examination. 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 These results will be called to the ordering clinician or representative by the Radiologist Assistant, and communication documented in the PACS or [employer]. Electronically Signed   By: Delanna Ahmadi M.D.   On: 10/22/2022 15:13   [place] CHEST (PLEURAL EFFUSION)  Result Date: 10/21/2022 CLINICAL DATA:  Large partially loculated effusion. 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 IR thoracentesis, 09/13/2022. Chest XR, 10/16/2022. FINDINGS: Informed written consent was obtained from the intubation after a discussion of the risks, benefits and alternatives to treatment. A timeout was performed prior to the initiation of the procedure. Initial ultrasound scanning demonstrates a no significant pleural effusion within either the RIGHT or LEFT chest. Planned thoracentesis was aborted. IMPRESSION: No sonographic evidence of a significant pleural effusion within the RIGHT or LEFT chest. Thoracentesis was not performed Electronically Signed   By: Michaelle Birks M.D.   On: 10/21/2022 14:45    Procedures Procedures    Medications Ordered in ED Medications - No data to display  ED Course/ Medical Decision Making/ A&P                             Medical Decision Making  62 year old female presenting with complaints of's of breath.  On arrival she is anxious appearing, tachycardic in the low 100s, afebrile.  Not hypoxic but placed on 2 L nasal cannula for comfort.  She has significantly decreased lung sounds in the right upper and lower lobes.  I reviewed her workup done by Dr. Julien Nordmann, her PE study shows large right pleural effusion and stable masses, however she was just evaluated by IR several days ago and ultrasound evaluation did not reveal a drainable effusion.  I spoke with Dr. Earlie Server who recommends admission for further IR evaluation given she is symptomatic.  Labs ordered, BMP with mild hyponatremia and hypokalemia,  CBC with stable hemoglobin of 11.3.  Chest x-ray with no new pneumothorax, with stable pleural effusion.  Consulted hospitalist for admission.  Discussed case w/ Dr. Lorin Mercy who will admit the patient for further evaluation and treatment  Final Clinical Impression(s) / ED Diagnoses Final diagnoses:  Dyspnea on exertion  Pleural effusion    Rx / DC Orders ED Discharge Orders     None         Lyndel Safe 10/23/22 1100    Tretha Sciara, MD 10/26/22 1135

## 2022-10-23 NOTE — TOC Progression Note (Signed)
Transition of Care [facility]) - Progression Note    Patient Details  Name: Brenda Patel MRN: [id number] Date of Birth: 01/31/1961  Transition of Care [facility]) CM/SW [place], RN Phone Number:[phone]  10/23/2022, 3:21 PM  Clinical Narrative:    [facility] acknowledges general consult for Home Health / DME Needs.  Transition of Care [facility]) Screening Note   Patient Details  Name: Brenda Patel Date of Birth: 11/27/1960   Transition of Care [facility]) CM/SW Contact:    Angelita Ingles, RN Phone Number: 10/23/2022, 3:22 PM    Transition of Care [facility]) has reviewed patient and no TOC needs have been identified at this time. We will continue to monitor patient advancement through interdisciplinary progression rounds. If new patient transition needs arise, please place a TOC consult.           Expected Discharge Plan and Services                                               Social Determinants of Health (SDOH) Interventions SDOH Screenings   Physical Activity: Unknown (07/15/2018)  Tobacco Use: Low Risk  (10/23/2022)    Readmission Risk Interventions     No data to display

## 2022-10-23 NOTE — H&P (Signed)
History and Physical    Patient: Brenda Patel [id number] DOB: 1961-04-23 DOA: 10/23/2022 DOS: the patient was seen and examined on 10/23/2022 PCP: [name], FNP  Patient coming from: Home - lives alone; NOK: Sister, [phone]   Chief Complaint: SOB  HPI: Brenda Patel is a 62 y.o. 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  She has cancer around her R lung, taking Avastin and Temodar.  1 month ago (1/15), she went to the ER for SOB.  On 1/19, she had a thoracentesis with removal of 150 cc.  She has been mildly more SOB since.  Last Wednesday, she had a CXR with pleural effusion.  Scheduled for thoracentesis on 2/26 but unable to drain the fluid.  Her breathing is worse, particularly with orthopnea.  She called Dr. Julien Nordmann yesterday AM, had CT in the afternoon with larger effusion "or something."  Ongoing symptoms, called again and was told to come to the ER.  Repetitive but dry cough.  Periodic elevated temp to 99.3, no frank fever.  Chemo since April, had thrombocytopenia then.  Has a sarcoma specialist at [facility].    Initially resected, went without treatment for 18 months or so.  Then had a diaphragmatic mass, had radiation with stabilization and then developed small tumors in the pleural cavity, treated with chemo.    ER Course:  RLL neoplasm, followed by Dr. Julien Nordmann.  Seen on 2/26 by IR, no drainable pocket.  CTA negative for PE, has large R pleural effusion.  Having SOB.  Dr. Julien Nordmann recommends admission, IR consult.     Review of Systems: As mentioned in the history of present illness. All other systems reviewed and are negative. 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   Past Surgical History:  Procedure Laterality Date   CHEST TUBE INSERTION Right 07/16/2018   Procedure:  INSERTION PLEURAL DRAINAGE CATHETER, right;  Surgeon: Grace Isaac, MD;  Location: [facility];  Service: Thoracic;  Laterality: Right;   COLONOSCOPY  2017   GUM SURGERY  1984   GRAFTS   IR IMAGING GUIDED PORT INSERTION  10/02/2021   IR IMAGING GUIDED PORT INSERTION  07/31/2022   IR RADIOLOGIST EVAL & MGMT  10/08/2021   IR RADIOLOGIST EVAL & MGMT  10/11/2021   IR RADIOLOGIST EVAL & MGMT  04/17/2022   IR REMOVAL TUN ACCESS W/ PORT W/O FL MOD SED  10/15/2021   IR THORACENTESIS ASP PLEURAL SPACE W/IMG GUIDE  07/03/2018   Social History:  reports that she has never smoked. She has never used smokeless tobacco. She reports current alcohol use. She reports that she does not use drugs.  Allergies  Allergen Reactions   Cyanoacrylate Dermatitis    Dermabond Topical Skin Adhesive   Other Dermatitis    Bard Power Port   Wound Dressing Adhesive Itching and Rash    Dermabond    Codeine Nausea Only and Other (See Comments)    Severe constipation, also   Lentil Other (See Comments)    [place] Beans - Allergy tested.    History reviewed. No pertinent family history.  Prior to Admission medications   Medication Sig Start Date End Date Taking? Authorizing Provider  Calcium Citrate 333 MG TABS Take 333 mg by mouth daily.  [provider]  COVID-19 mRNA vaccine [phone] (COMIRNATY) syringe Inject into the muscle. 07/05/22     fluticasone (FLONASE) 50 MCG/ACT nasal spray Place 1 spray into both nostrils daily as needed for allergies. 05/05/18   [provider]  IRON-VITAMIN C PO Take 1 tablet by mouth every other day.    [provider]  meloxicam (MOBIC) 15 MG tablet Take 15 mg by mouth daily as needed for pain.  10/13/19   [provider]  montelukast (SINGULAIR) 10 MG tablet Take 10 mg by mouth daily after supper. 06/07/18   [provider]  Olopatadine HCl (PATADAY OP) Place 1 drop into both eyes daily as needed (allergies).    [provider]   ondansetron (ZOFRAN) 8 MG tablet Take 1 tablet (8 mg total) by mouth every 8 (eight) hours as needed for nausea or vomiting. Patient taking differently: Take 8 mg by mouth See admin instructions. Take 8 mg 1 hour before taking Temodar, 7 days a month 03/11/22   Heilingoetter, Cassandra L, PA-C  pseudoephedrine (SUDAFED) 120 MG 12 hr tablet Take 120 mg by mouth See admin instructions. Take 120 mg in the morning, may take a second 120 mg dose as needed for allergies    [provider]  temozolomide (TEMODAR) 140 MG capsule Take 1 capsule (140 mg total) by mouth daily. Take on days 1-7 and days 15-21 of each 28 day cycle. May take on an empty stomach or at bedtime to decrease nausea & vomiting. 09/16/22   Curt Bears, MD    Physical Exam: Vitals:   10/23/22 0919 10/23/22 1045 10/23/22 1413 10/23/22 1521  BP:  (!) 178/88 (!) 159/89 (!) 146/79  Pulse:  (!) 102 (!) 117 (!) 116  Resp:  (!) '25 18 18  '$ Temp: 98 F (36.7 C)  98.3 F (36.8 C) 98.2 F (36.8 C)  TempSrc: Oral  Oral Oral  SpO2:  100% 98% 98%  Weight:      Height:       General:  Appears calm and comfortable and is in NAD, repetitive cough Eyes:  PERRL, EOMI, normal lids, iris ENT:  grossly normal hearing, lips & tongue, mmm; appropriate dentition Neck:  no LAD, masses or thyromegaly Cardiovascular:  RR with mild tachycardia, no m/r/g. No LE edema. R chest port-a-cath noted Respiratory:   Diminished breath sounds in the R lung.  Mildly increased respiratory effort. Abdomen:  soft, NT, ND Skin:  no rash or induration seen on limited exam Musculoskeletal:  grossly normal tone BUE/BLE, good ROM, no bony abnormality Psychiatric:  grossly normal mood and affect, speech fluent and appropriate, AOx3 Neurologic:  CN 2-12 grossly intact, moves all extremities in coordinated fashion   Radiological Exams on Admission: Independently reviewed - see discussion in A/P where applicable  [place] CHEST (PLEURAL EFFUSION)  Result Date:  10/23/2022 CLINICAL DATA:  Lung carcinoma, pleural masses and potential component right-sided pleural fluid by imaging. Evaluation for possible thoracentesis. EXAM: CHEST ULTRASOUND COMPARISON:  CTA of the chest on 10/22/2022 FINDINGS: Extensive sonographic evaluation of the right chest demonstrates only a small component of actual discernible fluid and a large component of complex tissue likely reflecting tumor in the right pleural space and hemithorax. There is no window or pocket of approachable pleural fluid for thoracentesis. IMPRESSION: Ultrasound demonstrates only a small component of actual right pleural fluid and a large component of complex tissue likely reflecting tumor in the right pleural space and hemithorax. No thoracentesis was performed. Electronically Signed  By: Aletta Edouard M.D.   On: 10/23/2022 15:53   DG Chest Portable 1 View  Result Date: 10/23/2022 CLINICAL DATA:  Shortness of breath. EXAM: PORTABLE CHEST 1 VIEW COMPARISON:  October 16, 2022.  October 22, 2022. FINDINGS: Stable cardiomediastinal silhouette. Right internal jugular Port-A-Cath is noted. Left lung is clear. Large rounded density is noted in the right lung most consistent with pleural effusion as noted on prior CT scan. Some degree of right basilar atelectasis is present as well. IMPRESSION: Large rounded density is again noted in the right lung most consistent with pleural effusion is noted on prior CT scan. Electronically Signed   By: Marijo Conception M.D.   On: 10/23/2022 09:43   CT Angio Chest Pulmonary Embolism (PE) W or WO Contrast  Result Date: 10/22/2022 CLINICAL DATA:  PE suspected, shortness of breath for 1 week, history of lung cancer * Tracking Code: BO * EXAM: CT ANGIOGRAPHY CHEST WITH CONTRAST TECHNIQUE: Multidetector CT imaging of the chest was performed using the standard protocol during bolus administration of intravenous contrast. Multiplanar CT image reconstructions and MIPs were obtained to  evaluate the vascular anatomy. RADIATION DOSE REDUCTION: This exam was performed according to the departmental dose-optimization program which includes automated exposure control, adjustment of the mA and/or kV according to patient size and/or use of iterative reconstruction technique. CONTRAST:  60m OMNIPAQUE IOHEXOL 350 MG/ML SOLN COMPARISON:  09/09/2022 FINDINGS: Cardiovascular: Satisfactory opacification of the pulmonary arteries to the segmental level. No evidence of pulmonary embolism. Normal heart size. No pericardial effusion. Right chest port catheter. Mediastinum/Nodes: No enlarged mediastinal, hilar, or axillary lymph nodes. Thyroid gland, trachea, and esophagus demonstrate no significant findings. Lungs/Pleura: Large right pleural effusion, significantly increased in volume compared to prior examination. Multiple pleural masses and nodules, similar to prior examination, index mass in the right lung base measuring 5.0 x 3.6 cm (series 7, image 77). Some nodules may be slightly enlarged compared to prior examination, for example anteriorly measuring 2.8 x 1.8 cm, previously 2.5 x 1.4 cm (series 7, image 27). Left lung is normally aerated. Upper Abdomen: No acute abnormality. Musculoskeletal: No chest wall abnormality. No acute osseous findings. Review of the MIP images confirms the above findings. IMPRESSION: 1. Negative examination for pulmonary embolism. 2. Large right pleural effusion, significantly increased in volume compared to prior examination and possibly symptomatic due to large size. 3. Multiple pleural masses and nodules, similar to prior examination, although some smaller nodules may be slightly enlarged compared to prior examination. 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 These results will be called to the ordering clinician or representative by the Radiologist Assistant, and communication documented in the PACS or [employer] Electronically Signed    By: ADelanna AhmadiM.D.   On: 10/22/2022 15:13    EKG: Independently reviewed.  Sinus tachycardia with rate 108; nonspecific ST changes with no evidence of acute ischemia   Labs on Admission: I have personally reviewed the available labs and imaging studies at the time of the admission.  Pertinent labs:    K+ 3.4 WBC 7.2 Hgb 11.3 Platelets 129   Assessment and Plan: Principal Problem:   Pleural effusion    Sarcomatoid lung cancer -Patient with worsening SOB in the setting of known sarcomatoid lung CA -She was found to have a large pleural effusion but this was not a drainable collection earlier this week -She had a CT with again concern for worsening effusion and she was sent in by oncology for consideration  of drainage -She was admitted to telemetry -IR was consulted for drainage -Unfortunately, findings appear consistent more with evolving mass in the R chest rather than effusion -Dr. Julien Nordmann will consult -It is especially unfortunate that this is evolving despite active treatment with Avastin and Temodar -She reports having already had radiation and believes she is not able to be treated again with radiation -Symptomatic control with O2 for now  Elevated BP -No known diagnosis of HTN -This was also noted recently at her 2/19 oncology visit    Advance Care Planning:   Code Status: Full Code - Code status was discussed with the patient and/or family at the time of admission.  The patient would want to receive full resuscitative measures at this time.   Consults: Oncology; IT; nutrition; TOC team; PT/OT  DVT Prophylaxis: SCDs  Family Communication: Sister was present throughout evaluation  Severity of Illness: The appropriate patient status for this patient is INPATIENT. Inpatient status is judged to be reasonable and necessary in order to provide the required intensity of service to ensure the patient's safety. The patient's presenting symptoms, physical exam findings,  and initial radiographic and laboratory data in the context of their chronic comorbidities is felt to place them at high risk for further clinical deterioration. Furthermore, it is not anticipated that the patient will be medically stable for discharge from the hospital within 2 midnights of admission.   * I certify that at the point of admission it is my clinical judgment that the patient will require inpatient hospital care spanning beyond 2 midnights from the point of admission due to high intensity of service, high risk for further deterioration and high frequency of surveillance required.*  Author: Karmen Bongo, MD 10/23/2022 5:09 PM  For on call review [email or web address].

## 2022-10-23 NOTE — Progress Notes (Signed)
Subjective: ***  Objective: Vital signs in last 24 hours: Temp:  [98 F (36.7 C)] 98 F (36.7 C) (02/28 0919) Pulse Rate:  [102-110] 102 (02/28 1045) Resp:  [17-25] 25 (02/28 1045) BP: (159-178)/(88-92) 178/88 (02/28 1045) SpO2:  [94 %-100 %] 100 % (02/28 1045) Weight:  [150 lb (68 kg)] 150 lb (68 kg) (02/28 0906)  Intake/Output from previous day: No intake/output data recorded. Intake/Output this shift: No intake/output data recorded.  [id number]  Lab Results:  Recent Labs    10/23/22 0951  WBC 7.2  HGB 11.3*  HCT 34.2*  PLT 129*   BMET Recent Labs    10/23/22 0951  NA 134*  K 3.4*  CL 100  CO2 26  GLUCOSE 95  BUN 8  CREATININE 0.56  CALCIUM 8.3*    Studies/Results: DG Chest Portable 1 View  Result Date: 10/23/2022 CLINICAL DATA:  Shortness of breath. EXAM: PORTABLE CHEST 1 VIEW COMPARISON:  October 16, 2022.  October 22, 2022. FINDINGS: Stable cardiomediastinal silhouette. Right internal jugular Port-A-Cath is noted. Left lung is clear. Large rounded density is noted in the right lung most consistent with pleural effusion as noted on prior CT scan. Some degree of right basilar atelectasis is present as well. IMPRESSION: Large rounded density is again noted in the right lung most consistent with pleural effusion is noted on prior CT scan. Electronically Signed   By: Marijo Conception M.D.   On: 10/23/2022 09:43   CT Angio Chest Pulmonary Embolism (PE) W or WO Contrast  Result Date: 10/22/2022 CLINICAL DATA:  PE suspected, shortness of breath for 1 week, history of lung cancer * Tracking Code: BO * EXAM: CT ANGIOGRAPHY CHEST WITH CONTRAST TECHNIQUE: Multidetector CT imaging of the chest was performed using the standard protocol during bolus administration of intravenous contrast. Multiplanar CT image reconstructions and MIPs were obtained to evaluate the vascular anatomy. RADIATION DOSE REDUCTION: This exam was performed according to the departmental  dose-optimization program which includes automated exposure control, adjustment of the mA and/or kV according to patient size and/or use of iterative reconstruction technique. CONTRAST:  67m OMNIPAQUE IOHEXOL 350 MG/ML SOLN COMPARISON:  09/09/2022 FINDINGS: Cardiovascular: Satisfactory opacification of the pulmonary arteries to the segmental level. No evidence of pulmonary embolism. Normal heart size. No pericardial effusion. Right chest port catheter. Mediastinum/Nodes: No enlarged mediastinal, hilar, or axillary lymph nodes. Thyroid gland, trachea, and esophagus demonstrate no significant findings. Lungs/Pleura: Large right pleural effusion, significantly increased in volume compared to prior examination. Multiple pleural masses and nodules, similar to prior examination, index mass in the right lung base measuring 5.0 x 3.6 cm (series 7, image 77). Some nodules may be slightly enlarged compared to prior examination, for example anteriorly measuring 2.8 x 1.8 cm, previously 2.5 x 1.4 cm (series 7, image 27). Left lung is normally aerated. Upper Abdomen: No acute abnormality. Musculoskeletal: No chest wall abnormality. No acute osseous findings. Review of the MIP images confirms the above findings. IMPRESSION: 1. Negative examination for pulmonary embolism. 2. Large right pleural effusion, significantly increased in volume compared to prior examination and possibly symptomatic due to large size. 3. Multiple pleural masses and nodules, similar to prior examination, although some smaller nodules may be slightly enlarged compared to prior examination. 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 These results will be called to the ordering clinician or representative by the Radiologist Assistant, and communication documented in the PACS or [employer] Electronically Signed   By: AJamse MeadD.  On: 10/22/2022 15:13    Medications: {medication reviewed/display:[id number]}  CODE  STATUS: ***  Assessment/Plan: ***   LOS: 0 days    Brenda Patel 10/23/2022

## 2022-10-23 NOTE — Progress Notes (Cosign Needed)
IR asked to eval for possible thoracentesis of right sided mass/effusion seen on imaging. All imaging reviewed again today with Dr. Kathlene Cote as was discussed on 2/26. Findings more consistent with evolving mass in right chest, NOT effusion.  [place] today again demonstrates large tumor burden in right chest. Minimal free fluid seen. No thoracentesis attempted.  Consider repeat outpt PET scan to reassess disease progression.  Ascencion Dike PA-C Interventional Radiology 10/23/2022 2:36 PM

## 2022-10-24 ENCOUNTER — Other Ambulatory Visit: Payer: Self-pay | Admitting: Hematology and Oncology

## 2022-10-24 DIAGNOSIS — C3491 Malignant neoplasm of unspecified part of right bronchus or lung: Secondary | ICD-10-CM

## 2022-10-24 DIAGNOSIS — J90 Pleural effusion, not elsewhere classified: Secondary | ICD-10-CM | POA: Diagnosis not present

## 2022-10-24 LAB — BASIC METABOLIC PANEL
Anion gap: 7 (ref 5–15)
BUN: 6 mg/dL (ref 6–20)
CO2: 26 mmol/L (ref 22–32)
Calcium: 8.1 mg/dL — ABNORMAL LOW (ref 8.9–10.3)
Chloride: 98 mmol/L (ref 98–111)
Creatinine, Ser: 0.45 mg/dL (ref 0.44–1.00)
GFR, Estimated: >60 mL/min (ref >60)
Glucose, Bld: 99 mg/dL (ref 70–99)
Potassium: 3.2 mmol/L — ABNORMAL LOW (ref 3.5–5.1)
Sodium: 131 mmol/L — ABNORMAL LOW (ref 135–145)

## 2022-10-24 LAB — CBC
HCT: 34 % — ABNORMAL LOW (ref 36.0–46.0)
Hemoglobin: 11.1 g/dL — ABNORMAL LOW (ref 12.0–15.0)
MCH: 32.2 pg (ref 26.0–34.0)
MCHC: 32.6 g/dL (ref 30.0–36.0)
MCV: 98.6 fL (ref 80.0–100.0)
Platelets: 126 10*3/uL — ABNORMAL LOW (ref 150–400)
RBC: 3.45 MIL/uL — ABNORMAL LOW (ref 3.87–5.11)
RDW: 13.2 % (ref 11.5–15.5)
WBC: 7.9 10*3/uL (ref 4.0–10.5)
nRBC: 0 % (ref 0.0–0.2)

## 2022-10-24 MED ORDER — METHYLPREDNISOLONE SODIUM SUCC 125 MG IJ SOLR
125.0000 mg | Freq: Two times a day (BID) | INTRAMUSCULAR | Status: DC
  Administered 2022-10-24 – 2022-10-25 (×2): 125 mg via INTRAVENOUS
  Filled 2022-10-24 (×2): qty 2

## 2022-10-24 MED ORDER — CHLORHEXIDINE GLUCONATE CLOTH 2 % EX PADS
6.0000 | MEDICATED_PAD | Freq: Every day | CUTANEOUS | Status: DC
  Administered 2022-10-25 – 2022-10-28 (×4): 6 via TOPICAL

## 2022-10-24 MED ORDER — ALBUTEROL SULFATE (2.5 MG/3ML) 0.083% IN NEBU: 2.5000 mg | INHALATION_SOLUTION | RESPIRATORY_TRACT | Status: DC | PRN

## 2022-10-24 MED ORDER — POTASSIUM CHLORIDE 20 MEQ PO PACK
40.0000 meq | PACK | Freq: Once | ORAL | Status: DC
  Filled 2022-10-24: qty 2

## 2022-10-24 MED ORDER — GUAIFENESIN-DM 100-10 MG/5ML PO SYRP
5.0000 mL | ORAL_SOLUTION | ORAL | Status: DC | PRN
  Administered 2022-10-24 – 2022-10-26 (×4): 5 mL via ORAL
  Filled 2022-10-24 (×6): qty 5

## 2022-10-24 MED ORDER — POTASSIUM CHLORIDE CRYS ER 20 MEQ PO TBCR
40.0000 meq | EXTENDED_RELEASE_TABLET | Freq: Two times a day (BID) | ORAL | Status: AC
  Administered 2022-10-24 (×2): 40 meq via ORAL
  Filled 2022-10-24 (×2): qty 2

## 2022-10-24 MED ORDER — ALPRAZOLAM 0.25 MG PO TABS
0.2500 mg | ORAL_TABLET | Freq: Once | ORAL | Status: AC
  Administered 2022-10-24: 0.25 mg via ORAL
  Filled 2022-10-24: qty 1

## 2022-10-24 MED ORDER — ALBUTEROL SULFATE (2.5 MG/3ML) 0.083% IN NEBU
2.5000 mg | INHALATION_SOLUTION | Freq: Two times a day (BID) | RESPIRATORY_TRACT | Status: DC
  Administered 2022-10-24: 2.5 mg via RESPIRATORY_TRACT
  Filled 2022-10-24: qty 3

## 2022-10-24 NOTE — Evaluation (Signed)
Occupational Therapy Evaluation Patient Details Name: Brenda Patel MRN: [id number] DOB: Nov 28, 1960 Today's Date: 10/24/2022   History of Present Illness Brenda Patel is a 62 y.o. 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 Unfortunately, findings appear consistent more with evolving mass in the R chest rather than effusion   Clinical Impression   Brenda Patel is a 62 year old woman who presents with decreased activity tolerance and shortness of breath. On evaluation she is physically able to perform ADLs and mobility - but is limited by shortness of breath. She becomes anxious and fearful when she can't breathe. She has been walking to the bathroom. Therapist recommended use of BSC and conserving her energy for needed and enjoyable activities. Patient unsure of discharge plan - unsure of her sister would be willing to come and stay with her and she also mentioned Hospice house. Therapist does not recommend further OT services though therapist does recommend BSC to assist with energy conservation. Do not recommend unnecessary bouts of ambulation in order for patient to conserve her energy, and reduce her episodes of anxiety and fear.       Recommendations for follow up therapy are one component of a multi-disciplinary discharge planning process, led by the attending physician.  Recommendations may be updated based on patient status, additional functional criteria and insurance authorization.   Follow Up Recommendations  No OT follow up     Assistance Recommended at Discharge Intermittent Supervision/Assistance  Patient can return home with the following Assistance with cooking/housework    Functional Status Assessment  Patient has had a recent decline in their functional status and demonstrates the ability to make significant improvements in function in a reasonable and predictable amount  of time.  Equipment Recommendations  BSC/3in1    Recommendations for Other Services       Precautions / Restrictions Precautions Precautions: None Restrictions Weight Bearing Restrictions: No      Mobility Bed Mobility Overal bed mobility: Modified Independent                  Transfers   Equipment used: None                      Balance Overall balance assessment: No apparent balance deficits (not formally assessed)                                         ADL either performed or assessed with clinical judgement   ADL Overall ADL's : Modified independent                                       General ADL Comments: Increased time. Gets anxious, fearful and short of breath with activity.     Vision Patient Visual Report: No change from baseline       Perception     Praxis      Pertinent Vitals/Pain Pain Assessment Pain Assessment: Faces Faces Pain Scale: Hurts a little bit Pain Location: chest Pain Descriptors / Indicators: Grimacing Pain Intervention(s): Monitored during session     Hand Dominance     Extremity/Trunk Assessment Upper Extremity Assessment Upper Extremity Assessment: Overall WFL for tasks assessed   Lower Extremity Assessment Lower  Extremity Assessment: Overall WFL for tasks assessed   Cervical / Trunk Assessment Cervical / Trunk Assessment: Normal   Communication Communication Communication: No difficulties   Cognition Arousal/Alertness: Awake/alert Behavior During Therapy: WFL for tasks assessed/performed Overall Cognitive Status: Within Functional Limits for tasks assessed                                       General Comments       Exercises     Shoulder Instructions      Home Living Family/patient expects to be discharged to:: Private residence Living Arrangements: Alone Available Help at Discharge: Family;Available PRN/intermittently Type of Home:  House Home Access: Level entry     Home Layout: One level     Bathroom Shower/Tub: Occupational psychologist: Standard     Home Equipment: Shower seat          Prior Functioning/Environment Prior Level of Function : Independent/Modified Independent                        OT Problem List: Decreased activity tolerance;Cardiopulmonary status limiting activity      OT Treatment/Interventions:      OT Goals(Current goals can be found in the care plan section) Acute Rehab OT Goals OT Goal Formulation: All assessment and education complete, DC therapy  OT Frequency:      Co-evaluation              AM-PAC OT "6 Clicks" Daily Activity     Outcome Measure Help from another person eating meals?: None Help from another person taking care of personal grooming?: None Help from another person toileting, which includes using toliet, bedpan, or urinal?: None Help from another person bathing (including washing, rinsing, drying)?: None Help from another person to put on and taking off regular upper body clothing?: None Help from another person to put on and taking off regular lower body clothing?: None 6 Click Score: 24   End of Session    Activity Tolerance: Patient tolerated treatment well Patient left: in bed;with call bell/phone within reach  OT Visit Diagnosis: Muscle weakness (generalized) (M62.81)                Time: [id number] OT Time Calculation (min): 12 min Charges:  OT General Charges $OT Visit: 1 Visit OT Evaluation $OT Eval Low Complexity: 1 Low  Gustavo Lah, OTR/L Acute Care Rehab Services  Office [phone]   Lenward Chancellor 10/24/2022, 10:35 AM

## 2022-10-24 NOTE — Plan of Care (Signed)

## 2022-10-24 NOTE — Progress Notes (Signed)
START OFF PATHWAY REGIMEN - Sarcoma   [id number]:Dacarbazine 150 mg/m2/day CIV D1,2,3,4 + Doxorubicin 20 mg/m2/day CIV D1,2,3,4 q28 Days:   A cycle is every 28 days:     Dacarbazine      Doxorubicin   **Always confirm dose/schedule in your pharmacy ordering system**  Patient Characteristics: Head and Neck Soft-Tissue Sarcoma, Unresectable/Metastatic, Third Line and Beyond, MSS/pMMR or MS Unknown and TMB-L/Unknown Histology/Anatomic Site: Common STS Histologies: Head and Neck AJCC T Category: TX AJCC N Category: N0 AJCC M Category: M1 AJCC Grade: G3 Therapeutic Status: Metastatic Line of Therapy: Third Engineer, civil (consulting) Status: Unknown Tumor Mutational Burden (TMB): Unknown Intent of Therapy: Non-Curative / Palliative Intent, Discussed with Patient

## 2022-10-24 NOTE — Progress Notes (Signed)
Subjective: The patient seen and examined today.  Her sister is at the bedside.  She continues to have significant dyspnea.  She was without her oxygen when I saw her because it dries her nose.  She could not sleep last night because of the dyspnea.  She also has cough with no chest pain or hemoptysis.  She has no fever or chills.  She has no nausea, vomiting, diarrhea or constipation.  Objective: Vital signs in last 24 hours: Temp:  [97.8 F (36.6 C)-98.7 F (37.1 C)] 98.7 F (37.1 C) (02/29 1317) Pulse Rate:  [104-113] 105 (02/29 1317) Resp:  [18] 18 (02/29 1317) BP: (145-161)/(79-93) 153/85 (02/29 1317) SpO2:  [97 %-100 %] 99 % (02/29 1317)  Intake/Output from previous day: 02/28 0701 - 02/29 0700 In: 1201.9 [P.O.:240; I.V.:961.9] Out: 600 [Urine:600] Intake/Output this shift: Total I/O In: 240 [P.O.:240] Out: 400 [Urine:400]  General appearance: alert, cooperative, fatigued, and mild distress Resp: diminished breath sounds RLL, RML, and RUL and dullness to percussion RLL, RML, and RUL Cardio: regular rate and rhythm, S1, S2 normal, no murmur, click, rub or gallop GI: soft, non-tender; bowel sounds normal; no masses,  no organomegaly Extremities: extremities normal, atraumatic, no cyanosis or edema  Lab Results:  Recent Labs    10/23/22 0951 10/24/22 0500  WBC 7.2 7.9  HGB 11.3* 11.1*  HCT 34.2* 34.0*  PLT 129* 126*   BMET Recent Labs    10/23/22 0951 10/24/22 0500  NA 134* 131*  K 3.4* 3.2*  CL 100 98  CO2 26 26  GLUCOSE 95 99  BUN 8 6  CREATININE 0.56 0.45  CALCIUM 8.3* 8.1*    Studies/Results: [place] CHEST (PLEURAL EFFUSION)  Result Date: 10/23/2022 CLINICAL DATA:  Lung carcinoma, pleural masses and potential component right-sided pleural fluid by imaging. Evaluation for possible thoracentesis. EXAM: CHEST ULTRASOUND COMPARISON:  CTA of the chest on 10/22/2022 FINDINGS: Extensive sonographic evaluation of the right chest demonstrates only a small component  of actual discernible fluid and a large component of complex tissue likely reflecting tumor in the right pleural space and hemithorax. There is no window or pocket of approachable pleural fluid for thoracentesis. IMPRESSION: Ultrasound demonstrates only a small component of actual right pleural fluid and a large component of complex tissue likely reflecting tumor in the right pleural space and hemithorax. No thoracentesis was performed. Electronically Signed   By: Brenda Patel M.D.   On: 10/23/2022 15:53   DG Chest Portable 1 View  Result Date: 10/23/2022 CLINICAL DATA:  Shortness of breath. EXAM: PORTABLE CHEST 1 VIEW COMPARISON:  October 16, 2022.  October 22, 2022. FINDINGS: Stable cardiomediastinal silhouette. Right internal jugular Port-A-Cath is noted. Left lung is clear. Large rounded density is noted in the right lung most consistent with pleural effusion as noted on prior CT scan. Some degree of right basilar atelectasis is present as well. IMPRESSION: Large rounded density is again noted in the right lung most consistent with pleural effusion is noted on prior CT scan. Electronically Signed   By: Brenda Patel M.D.   On: 10/23/2022 09:43    Medications: I have reviewed the patient's current medications.   Assessment/Plan: This is a very pleasant 62 years old white female with malignant neoplasm with sarcomatoid features initially diagnosed in December 2019 with large right lower lobe lung mass with recurrent pleural effusion.  The patient status post several treatment regimen in the past including surgical resection in addition to SBRT as well as treatment with  Pazopanib and recently on treatment with Temodar and Avastin status post 11 cycles. She has been tolerating this treatment fairly well. Unfortunately her recent imaging studies showed significant increase in her malignancy involving the right lung with multiple pleural masses and nodules as well as pressure on the mediastinum. I  had a lengthy discussion with the patient and her sister about her current condition and treatment options. Unfortunately her disease has progressed significantly in the last few weeks. I did speak to Brenda Patel, a sarcoma expert at [facility].  We discussed several options for management of her condition including palliative care and hospice referral versus consideration of systemic chemotherapy with doxorubicin 75 Mg/M2 and dacarbazine 750 Mg/M2 with Neulasta support every 3 weeks.  I worked with our pharmacy to create a care plan for this regimen. I had a lengthy discussion with the patient and her sister after my phone call with Brenda Patel about her condition and the treatment options including systemic chemotherapy with doxorubicin and dacarbazine.  The patient was not interested in systemic chemotherapy.  There was no other surgical option, radiotherapy or any intervention by interventional radiology at this point. The patient is interested in proceeding with the palliative care and hospice.  She was looking for anything that could help her breathing and I started her on high-dose Solu-Medrol 125 mg IV twice daily. Will consult the palliative care team for evaluation of her condition.  She is not interested in dying at home and she would like to be on a facility when she dies.  She also would like to see her dog before she dies and I got permission from the nursing staff and leadership at [facility] for the family to bring her dog to her room to see her. We will also try to resume her oxygen nasal cannula with humidifier. I think it may be a good idea to consider discharging the patient with hospice to the [facility] for end-of-life care because she is not interested in being discharged home. Thank you so much for taking good care of Brenda Patel.  Please call if I can help in any way. Disclaimer: This note was dictated with voice recognition software. Similar sounding words can inadvertently be  transcribed and may be missed upon review.    LOS: 1 day    Brenda Patel 10/24/2022

## 2022-10-24 NOTE — Progress Notes (Signed)
ALERT: A disease instance has been permanently removed from this patient's pathway record and replaced with a new disease instance. Information on the new disease instance will be transmitted in a separate message.  Disease Being Removed: Other  Reason for Removal: Reason not listed

## 2022-10-24 NOTE — Progress Notes (Signed)
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  She has cancer around the right lung, taking Avastin and Temodar.  Patient went to the ER 1 month ago for shortness of breath and she had a thoracocentesis with removal of 150 cc.  She has been mildly short of breath since that thoracocentesis.  Last week she had a chest x-ray still showed pleural effusion.  She was scheduled for thoracocentesis on 2/26 but unable to drain the fluid.  Her breathing has gotten worse particularly with orthopnea.  She has called Dr. Julien Nordmann, had a CT chest in the afternoon with larger effusion or something ongoing symptoms. She presented in the ED again.  She has sarcoma specialist at [facility].  It was initially resected, She went without treatment for 18 months or so then she had a diaphragmatic mass , had radiation with stabilization and then developed small tumors in the pleural cavity, treated with chemo.  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 Julien Nordmann.  Seen by IR on 2/26, no drainable pocket.  CTA negative for PE, has large right pleural effusion.  Patient admitted for further evaluation   Assessment & Plan:   Principal Problem:   Pleural effusion   Sarcomatoid lung cancer: -Patient with worsening SOB in the setting of known sarcomatoid lung CA -She was found to have a large pleural effusion but this was not a drainable collection earlier this week -She had a CT with again concern for worsening effusion and she was sent in by oncology for consideration of drainage.  -Patient was again evaluated by IR.  Findings are more consistent with evolving mass in the right chest not effusion.  No thoracocentesis attempted.  Ultrasound again demonstrated large tumor burden in the  right chest -She was admitted to telemetry -Unfortunately, findings appear consistent more with evolving mass in the R chest rather than effusion -As per Dr. Julien Nordmann unfortunately the disease has progressed significantly last few weeks.  He will reach out to Dr. Dawna Part to see if he has any other suggestions.  If no other options for management of her condition he would recommend palliative care and hospice referral. -It is especially unfortunate that this is evolving despite active treatment with Avastin and Temodar -She reports having already had radiation and believes she is not able to be treated again with radiation -Symptomatic control with O2 for now.   Elevated BP -No known diagnosis of HTN -This was also noted recently at her 2/19 oncology visit.     DVT prophylaxis: SCDs Code Status:Full code Family Communication: Sister at bed side Disposition Plan:   Admitted for Sarcoma of the lung with pleural effusion.  Oncology and IR is consulted  Consultants:  IR Oncology  Procedures:  Antimicrobials:  Anti-infectives (From admission, onward)    None       Subjective: Patient was seen and examined at bedside.  Overnight events noted.  Patient reports not feeling well. She knows that she has advanced disease and she is hoping that Dr. Dawna Part will have some treatment options for her.  Objective: Vitals:   10/24/22 0731 10/24/22 0733 10/24/22 0738 10/24/22 1317  BP:   (!) 156/93 (!) 153/85  Pulse:   (!) 107 (!) 105  Resp:  18 18  Temp:   97.8 F (36.6 C) 98.7 F (37.1 C)  TempSrc:   Oral Oral  SpO2: 99% 99% 100% 99%  Weight:      Height:        Intake/Output Summary (Last 24 hours) at 10/24/2022 1611 Last data filed at 10/24/2022 1510 Gross per 24 hour  Intake 1201.91 ml  Output 1000 ml  Net 201.91 ml   Filed Weights   10/23/22 0906  Weight: 68 kg    Examination:  General exam: Appears calm and comfortable, deconditioned, chronically ill  looking. Respiratory system: Clear to auscultation. Respiratory effort normal.  RR 16 Cardiovascular system: S1 & S2 heard, RRR. No JVD, murmurs, rubs, gallops or clicks. No pedal edema. Gastrointestinal system: Abdomen is soft, non tender, non distended, BS+ Central nervous system: Alert and oriented x 3. No focal neurological deficits. Extremities: No edema, no cyanosis, no clubbing Skin: No rashes, lesions or ulcers Psychiatry: Judgement and insight appear normal. Mood & affect appropriate.     Data Reviewed: I have personally reviewed following labs and imaging studies  CBC: Recent Labs  Lab 10/23/22 0951 10/24/22 0500  WBC 7.2 7.9  NEUTROABS 6.1  --   HGB 11.3* 11.1*  HCT 34.2* 34.0*  MCV 98.6 98.6  PLT 129* [id number]*   Basic Metabolic Panel: Recent Labs  Lab 10/23/22 0951 10/24/22 0500  NA 134* 131*  K 3.4* 3.2*  CL 100 98  CO2 26 26  GLUCOSE 95 99  BUN 8 6  CREATININE 0.56 0.45  CALCIUM 8.3* 8.1*   GFR: Estimated Creatinine Clearance: 70.9 mL/min (by C-G formula based on SCr of 0.45 mg/dL). Liver Function Tests: No results for input(s): "AST", "ALT", "ALKPHOS", "BILITOT", "PROT", "ALBUMIN" in the last 168 hours. No results for input(s): "LIPASE", "AMYLASE" in the last 168 hours. No results for input(s): "AMMONIA" in the last 168 hours. Coagulation Profile: No results for input(s): "INR", "PROTIME" in the last 168 hours. Cardiac Enzymes: No results for input(s): "CKTOTAL", "CKMB", "CKMBINDEX", "TROPONINI" in the last 168 hours. BNP (last 3 results) No results for input(s): "PROBNP" in the last 8760 hours. HbA1C: No results for input(s): "HGBA1C" in the last 72 hours. CBG: No results for input(s): "GLUCAP" in the last 168 hours. Lipid Profile: No results for input(s): "CHOL", "HDL", "LDLCALC", "TRIG", "CHOLHDL", "LDLDIRECT" in the last 72 hours. Thyroid Function Tests: No results for input(s): "TSH", "T4TOTAL", "FREET4", "T3FREE", "THYROIDAB" in the last 72  hours. Anemia Panel: No results for input(s): "VITAMINB12", "FOLATE", "FERRITIN", "TIBC", "IRON", "RETICCTPCT" in the last 72 hours. Sepsis Labs: No results for input(s): "PROCALCITON", "LATICACIDVEN" in the last 168 hours.  No results found for this or any previous visit (from the past 240 hour(s)).  Radiology Studies: [place] CHEST (PLEURAL EFFUSION)  Result Date: 10/23/2022 CLINICAL DATA:  Lung carcinoma, pleural masses and potential component right-sided pleural fluid by imaging. Evaluation for possible thoracentesis. EXAM: CHEST ULTRASOUND COMPARISON:  CTA of the chest on 10/22/2022 FINDINGS: Extensive sonographic evaluation of the right chest demonstrates only a small component of actual discernible fluid and a large component of complex tissue likely reflecting tumor in the right pleural space and hemithorax. There is no window or pocket of approachable pleural fluid for thoracentesis. IMPRESSION: Ultrasound demonstrates only a small component of actual right pleural fluid and a large component of complex tissue likely reflecting tumor in the right pleural space and hemithorax. No thoracentesis was performed. Electronically Signed   By: Aletta Edouard M.D.   On:  10/23/2022 15:53   DG Chest Portable 1 View  Result Date: 10/23/2022 CLINICAL DATA:  Shortness of breath. EXAM: PORTABLE CHEST 1 VIEW COMPARISON:  October 16, 2022.  October 22, 2022. FINDINGS: Stable cardiomediastinal silhouette. Right internal jugular Port-A-Cath is noted. Left lung is clear. Large rounded density is noted in the right lung most consistent with pleural effusion as noted on prior CT scan. Some degree of right basilar atelectasis is present as well. IMPRESSION: Large rounded density is again noted in the right lung most consistent with pleural effusion is noted on prior CT scan. Electronically Signed   By: Marijo Conception M.D.   On: 10/23/2022 09:43    Scheduled Meds:  docusate sodium  100 mg Oral BID   montelukast  10  mg Oral QPC supper   potassium chloride  40 mEq Oral BID   sodium chloride flush  3 mL Intravenous Q12H   Continuous Infusions:  lactated ringers 75 mL/hr at 10/24/22 0303     LOS: 1 day    Time spent: 50 mins    Brailon Don, MD [facility]   If 7PM-7AM, please contact night-coverage

## 2022-10-24 NOTE — Progress Notes (Signed)
PT Cancellation Note  Patient Details Name: Brenda Patel MRN: [id number] DOB: 10-20-1960   Cancelled Treatment:    Reason Eval/Treat Not Completed: PT screened, no needs identified, will sign off. Greeted pt and introduced PT, pt politely declines PT evaluation due to how exhausting short distance ambulation is (~10 ft). Pt reports she discussed energy conservation techniques with OT this morning. Discussed potential RW rec and pt declines. Agree with BSC rec from OT. Will sign off at this time, please re-consult if needs arise.    Tori Greg Eckrich PT, DPT 10/24/22, 11:19 AM

## 2022-10-25 DIAGNOSIS — R4589 Other symptoms and signs involving emotional state: Secondary | ICD-10-CM | POA: Diagnosis not present

## 2022-10-25 DIAGNOSIS — Z515 Encounter for palliative care: Secondary | ICD-10-CM

## 2022-10-25 DIAGNOSIS — C3491 Malignant neoplasm of unspecified part of right bronchus or lung: Secondary | ICD-10-CM

## 2022-10-25 DIAGNOSIS — Z7189 Other specified counseling: Secondary | ICD-10-CM | POA: Diagnosis not present

## 2022-10-25 DIAGNOSIS — G893 Neoplasm related pain (acute) (chronic): Secondary | ICD-10-CM

## 2022-10-25 DIAGNOSIS — R0602 Shortness of breath: Secondary | ICD-10-CM

## 2022-10-25 DIAGNOSIS — J90 Pleural effusion, not elsewhere classified: Secondary | ICD-10-CM | POA: Diagnosis not present

## 2022-10-25 DIAGNOSIS — F419 Anxiety disorder, unspecified: Secondary | ICD-10-CM

## 2022-10-25 DIAGNOSIS — Z79899 Other long term (current) drug therapy: Secondary | ICD-10-CM

## 2022-10-25 LAB — BASIC METABOLIC PANEL
Anion gap: 10 (ref 5–15)
BUN: 5 mg/dL — ABNORMAL LOW (ref 6–20)
CO2: 24 mmol/L (ref 22–32)
Calcium: 8.6 mg/dL — ABNORMAL LOW (ref 8.9–10.3)
Chloride: 94 mmol/L — ABNORMAL LOW (ref 98–111)
Creatinine, Ser: 0.4 mg/dL — ABNORMAL LOW (ref 0.44–1.00)
GFR, Estimated: >60 mL/min (ref >60)
Glucose, Bld: 136 mg/dL — ABNORMAL HIGH (ref 70–99)
Potassium: 4.1 mmol/L (ref 3.5–5.1)
Sodium: 128 mmol/L — ABNORMAL LOW (ref 135–145)

## 2022-10-25 LAB — CBC
HCT: 35 % — ABNORMAL LOW (ref 36.0–46.0)
Hemoglobin: 11.6 g/dL — ABNORMAL LOW (ref 12.0–15.0)
MCH: 31.8 pg (ref 26.0–34.0)
MCHC: 33.1 g/dL (ref 30.0–36.0)
MCV: 95.9 fL (ref 80.0–100.0)
Platelets: 135 10*3/uL — ABNORMAL LOW (ref 150–400)
RBC: 3.65 MIL/uL — ABNORMAL LOW (ref 3.87–5.11)
RDW: 12.8 % (ref 11.5–15.5)
WBC: 8 10*3/uL (ref 4.0–10.5)
nRBC: 0 % (ref 0.0–0.2)

## 2022-10-25 LAB — PHOSPHORUS: Phosphorus: 3.3 mg/dL (ref 2.5–4.6)

## 2022-10-25 LAB — MAGNESIUM: Magnesium: 2.1 mg/dL (ref 1.7–2.4)

## 2022-10-25 MED ORDER — GLYCOPYRROLATE 1 MG PO TABS: 1.0000 mg | ORAL_TABLET | ORAL | Status: DC | PRN

## 2022-10-25 MED ORDER — POLYVINYL ALCOHOL 1.4 % OP SOLN: 1.0000 [drp] | Freq: Four times a day (QID) | OPHTHALMIC | Status: DC | PRN

## 2022-10-25 MED ORDER — MORPHINE SULFATE (PF) 2 MG/ML IV SOLN: 1.0000 mg | INTRAVENOUS | Status: DC | PRN

## 2022-10-25 MED ORDER — SENNA 8.6 MG PO TABS
1.0000 | ORAL_TABLET | Freq: Every day | ORAL | Status: DC
  Administered 2022-10-27: 8.6 mg via ORAL
  Filled 2022-10-25 (×2): qty 1

## 2022-10-25 MED ORDER — GLYCOPYRROLATE 0.2 MG/ML IJ SOLN: 0.2000 mg | INTRAMUSCULAR | Status: DC | PRN

## 2022-10-25 MED ORDER — LORAZEPAM 2 MG/ML PO CONC: 0.5000 mg | ORAL | Status: DC | PRN

## 2022-10-25 MED ORDER — ACETAMINOPHEN 325 MG PO TABS: 650.0000 mg | ORAL_TABLET | Freq: Four times a day (QID) | ORAL | Status: DC | PRN

## 2022-10-25 MED ORDER — HYDROCOD POLI-CHLORPHE POLI ER 10-8 MG/5ML PO SUER
5.0000 mL | Freq: Two times a day (BID) | ORAL | Status: DC | PRN
  Administered 2022-10-25 – 2022-10-26 (×2): 5 mL via ORAL
  Filled 2022-10-25 (×2): qty 5

## 2022-10-25 MED ORDER — HYDROCOD POLI-CHLORPHE POLI ER 10-8 MG/5ML PO SUER
5.0000 mL | Freq: Two times a day (BID) | ORAL | Status: DC
  Administered 2022-10-25: 5 mL via ORAL
  Filled 2022-10-25: qty 5

## 2022-10-25 MED ORDER — METHYLPREDNISOLONE SODIUM SUCC 125 MG IJ SOLR
125.0000 mg | Freq: Every day | INTRAMUSCULAR | Status: DC
  Administered 2022-10-26 – 2022-10-28 (×3): 125 mg via INTRAVENOUS
  Filled 2022-10-25 (×3): qty 2

## 2022-10-25 MED ORDER — LORAZEPAM 2 MG/ML IJ SOLN: 0.5000 mg | INTRAMUSCULAR | Status: DC | PRN

## 2022-10-25 MED ORDER — MORPHINE SULFATE 10 MG/5ML PO SOLN
2.0000 mg | ORAL | Status: DC | PRN
  Administered 2022-10-26 – 2022-10-27 (×3): 2 mg via ORAL
  Filled 2022-10-25 (×3): qty 5

## 2022-10-25 MED ORDER — ACETAMINOPHEN 650 MG RE SUPP: 650.0000 mg | Freq: Four times a day (QID) | RECTAL | Status: DC | PRN

## 2022-10-25 MED ORDER — CLONAZEPAM 0.125 MG PO TBDP
0.1250 mg | ORAL_TABLET | Freq: Every evening | ORAL | Status: DC | PRN
  Administered 2022-10-25: 0.125 mg via ORAL
  Filled 2022-10-25: qty 1

## 2022-10-25 NOTE — Progress Notes (Signed)
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  She has cancer around the right lung, taking Avastin and Temodar.  Patient went to the ER 1 month ago for shortness of breath and she had a thoracocentesis with removal of 150 cc.  She has been mildly short of breath since that thoracocentesis.  Last week she had a chest x-ray still showed pleural effusion.  She was scheduled for thoracocentesis on 2/26 but unable to drain the fluid.  Her breathing has gotten worse particularly with orthopnea.  She has called Dr. Julien Nordmann, had a CT chest in the afternoon with larger effusion or something ongoing symptoms. She presented in the ED again.  She has sarcoma specialist at [facility].  It was initially resected, She went without treatment for 18 months or so then she had a diaphragmatic mass , had radiation with stabilization and then developed small tumors in the pleural cavity, treated with chemo.  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 Julien Nordmann.  Seen by IR on 2/26, no drainable pocket.  CTA negative for PE, has large right pleural effusion.  Patient admitted for further evaluation. Goals of care discussion completed with oncologist, herself,  patient's sister and palliative care.  Care transitioned to full comfort care.   Assessment & Plan:   Principal Problem:   Pleural effusion Active Problems:   Need for emotional support   High risk medication use   Shortness of breath   Cancer associated pain   Anxiety   Counseling and coordination of care   Goals of care, counseling/discussion   Palliative care encounter   Sarcomatoid lung cancer: -Patient with worsening SOB in the setting of known sarcomatoid lung CA -She was found to have a large pleural  effusion but this was not a drainable collection earlier this week -She had a CT with again concern for worsening effusion and she was sent in by oncology for consideration of drainage.  -Patient was again evaluated by IR.  Findings are more consistent with evolving mass in the right chest not effusion.  No thoracocentesis attempted.  Ultrasound again demonstrated large tumor burden in the right chest -She was admitted to telemetry -Unfortunately, findings appear consistent more with evolving mass in the R chest rather than effusion -As per Dr. Julien Nordmann unfortunately the disease has progressed significantly last few weeks.  He will reach out to Dr. Dawna Part to see if he has any other suggestions.  If no other options for management of her condition he would recommend palliative care and hospice referral. -It is especially unfortunate that this is evolving despite active treatment with Avastin and Temodar -She reports having already had radiation and believes she is not able to be treated again with radiation -Symptomatic control with O2 for now. -Goals of care discussion completed with oncologist, herself,  patient's sister and palliative care.  Care transitioned to full comfort care.   Elevated BP -No known diagnosis of HTN -This was also noted recently at her 2/19 oncology visit.     DVT prophylaxis: SCDs Code Status:Full code Family Communication: Sister at bed side. Disposition Plan:   Admitted for Sarcoma of the lung with pleural effusion.  Oncology and IR is consulted. Goals of care discussion completed with oncologist, herself,  patient's sister and palliative care.  Care transitioned to full comfort care.  Consultants:  IR Oncology  Procedures:  Antimicrobials:  Anti-infectives (From admission, onward)    None       Subjective: Patient was seen and examined at bedside.  Overnight events noted.  Patient reports not feeling well. She knows that she has advanced disease ,  wants to transition care to full comfort care and hospice.  Objective: Vitals:   10/24/22 1317 10/24/22 2137 10/25/22 0653 10/25/22 1320  BP: (!) 153/85 (!) 166/93 (!) 158/97 (!) 158/101  Pulse: (!) 105 98 (!) 108 (!) 109  Resp: '18 18 18 18  '$ Temp: 98.7 F (37.1 C) 98.2 F (36.8 C) 97.7 F (36.5 C) 98.4 F (36.9 C)  TempSrc: Oral Oral Oral Oral  SpO2: 99% 100% 100% 97%  Weight:      Height:        Intake/Output Summary (Last 24 hours) at 10/25/2022 1455 Last data filed at 10/25/2022 1014 Gross per 24 hour  Intake 120 ml  Output 200 ml  Net -80 ml   Filed Weights   10/23/22 0906  Weight: 68 kg    Examination:  General exam: Appears calm and comfortable, deconditioned, chronically ill looking. Respiratory system: CTA bilaterally, respiratory for normal, RR 18 Cardiovascular system: S1 & S2 heard, RRR. No JVD, murmurs, rubs, gallops or clicks. No pedal edema. Gastrointestinal system: Abdomen is soft, non tender, non distended, BS+ Central nervous system: Alert and oriented x 3. No focal neurological deficits. Extremities: No edema, no cyanosis, no clubbing Skin: No rashes, lesions or ulcers Psychiatry: Judgement and insight appear normal. Mood & affect appropriate.     Data Reviewed: I have personally reviewed following labs and imaging studies  CBC: Recent Labs  Lab 10/23/22 0951 10/24/22 0500 10/25/22 0441  WBC 7.2 7.9 8.0  NEUTROABS 6.1  --   --   HGB 11.3* 11.1* 11.6*  HCT 34.2* 34.0* 35.0*  MCV 98.6 98.6 95.9  PLT 129* 126* [id number]*   Basic Metabolic Panel: Recent Labs  Lab 10/23/22 0951 10/24/22 0500 10/25/22 0441  NA 134* 131* 128*  K 3.4* 3.2* 4.1  CL 100 98 94*  CO2 '26 26 24  '$ GLUCOSE 95 99 136*  BUN 8 6 5*  CREATININE 0.56 0.45 0.40*  CALCIUM 8.3* 8.1* 8.6*  MG  --   --  2.1  PHOS  --   --  3.3   GFR: Estimated Creatinine Clearance: 70.9 mL/min (A) (by C-G formula based on SCr of 0.4 mg/dL (L)). Liver Function Tests: No results for input(s):  "AST", "ALT", "ALKPHOS", "BILITOT", "PROT", "ALBUMIN" in the last 168 hours. No results for input(s): "LIPASE", "AMYLASE" in the last 168 hours. No results for input(s): "AMMONIA" in the last 168 hours. Coagulation Profile: No results for input(s): "INR", "PROTIME" in the last 168 hours. Cardiac Enzymes: No results for input(s): "CKTOTAL", "CKMB", "CKMBINDEX", "TROPONINI" in the last 168 hours. BNP (last 3 results) No results for input(s): "PROBNP" in the last 8760 hours. HbA1C: No results for input(s): "HGBA1C" in the last 72 hours. CBG: No results for input(s): "GLUCAP" in the last 168 hours. Lipid Profile: No results for input(s): "CHOL", "HDL", "LDLCALC", "TRIG", "CHOLHDL", "LDLDIRECT" in the last 72 hours. Thyroid Function Tests: No results for input(s): "TSH", "T4TOTAL", "FREET4", "T3FREE", "THYROIDAB" in the last 72 hours. Anemia Panel: No results for input(s): "VITAMINB12", "FOLATE", "FERRITIN", "TIBC", "IRON", "RETICCTPCT" in the last 72 hours. Sepsis Labs: No results for input(s): "PROCALCITON", "LATICACIDVEN"  in the last 168 hours.  No results found for this or any previous visit (from the past 240 hour(s)).  Radiology Studies: No results found.  Scheduled Meds:  Chlorhexidine Gluconate Cloth  6 each Topical Q0600   [START ON 10/26/2022] methylPREDNISolone (SOLU-MEDROL) injection  125 mg Intravenous Daily   montelukast  10 mg Oral QPC supper   senna  1 tablet Oral Daily   sodium chloride flush  3 mL Intravenous Q12H   Continuous Infusions:     LOS: 2 days    Time spent: 35 mins    Demonte Dobratz, MD [facility]   If 7PM-7AM, please contact night-coverage

## 2022-10-25 NOTE — Consult Note (Signed)
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 Mass was initially resected, she went without treatment for 18 months or so then she had a diaphragmatic mass , had radiation with stabilization and then developed small tumors in the pleural cavity, treated with chemo. For cancer management, patient has been following up with Dr. Earlie Server and a sarcoma specialist at [facility].  Imaging on admission has shown large right pleural effusion.  Dr. Earlie Server already met with patient and discussed care.  Patient does not want to pursue any further cancer directed therapies. Palliative medicine consulted to assist with complex medical decision making and symptom management.  Extensive review of EMR prior to seeing patient.  Presented to bedside and introduced myself and the role of the palliative medicine team in patient's care.  Present at patient's bedside was her sister, Brenda Patel.  Patient gave permission to have conversation with Brenda Patel present.  ------------------------------------------------------------------------------------------------------------- Advance Care Planning Conversation  Pertinent diagnosis: sarcoid tumor of the lung, pleural effusion  The patient and/or family consented to a voluntary [place]. Individuals present for the conversation: patient, patient's sister- Brenda Patel, this palliative provider  Summary of the conversation:  Patient and her sister were able to update  me regarding her medical journey.  Patient able to update me regarding conversations with Dr. Julien Nordmann and that she has decided she no longer wants to pursue cancer directed therapies.  Patient feels that she has had good time since her initial diagnosis in 2019.  Patient feels that the chemotherapy is offered can make her feel very bad and there are no surgical options anymore for actual removal.   With this in mind, we discussed care moving forward.  Discussed the idea of transition to comfort care at this time and what that would entail.  Discussed discontinuation of imaging, IV fluids, and lab work and instead focusing management on symptoms such as pain, dyspnea, and anxiety.  Patient's primary concern is enjoying quality of life with her family and dog during the time she does have left.  Patient appropriately anxious about worsening dyspnea in the setting of her current disease.  Acknowledged this and empathized with difficult situation.   Discussed code status as well and explained differences between full code and DNR. Patient would like code status changed to DNR at this time.   Outcome of the conversations and/or documents completed:  Transition to comfort focused care at this time. Code status updated to DNR.   I spent 27 minutes providing separately identifiable ACP services with the patient and/or surrogate decision maker in a voluntary, in-person conversation discussing the patient's wishes and goals as detailed in the above note.  Chelsea Aus, DO Palliative Care Provider  ------------------------------------------------------------------------------------------------------------- Also spent time exploring best way to manage dyspnea in the setting of cancer. In regards to patient's dyspnea, it is worse with orthopnea.  Patient can also become very short of breath by standing or moving to the close his bedside commode.  Patient does feel that humidified oxygen help  support her breathing.   Patient has also been started on steroids IV for management of inflammation.  Discussed continuing IV Solu-Medrol in the morning as to not cause agitation at night.  Also discussed how opioids are appropriate medical management of dyspnea in the setting of patient's cancer.  Explained that opioids can manage not only dyspnea but pain as well.  Patient is having some pain in her back from where the pressure is from her lung mass.  Patient denied any reactions to receiving morphine previously after her surgery in 2019 and agrees with starting morphine solution and IV for management of pain and dyspnea.  Also discussed patient's bowel movements.  Patient has been having small bowel movements during the day.  Discussed importance of being on a bowel regimen while receiving opioids.  Noted would start senna at this time.  Did discuss MiraLAX with patient though she is reluctant to take medication that she needs to drink,  Acknowledged this.  Patient's other symptom concern at this time is sleep.  Patient describes worsening anxiety at night and that her mind continues to run what keeps her awake.  We discussed management of anxiety.  Though would normally start SSRI for management, in this setting need more rapid onset of medication.  Discussed addition of low-dose benzodiazepines for management of anxiety.  Will add clonazepam nightly as needed and Ativan as needed throughout the day.  Patient agreeing with this plan.  Inquired about patient's appetite.  Patient's appetite has been poor and worsening.  Patient describes minimal food intake. Patient notes rapid weight in past weeks.   Discussed supporting patient's care moving forward outside of the hospital.  Patient lives alone with her dog.  Patient does not want to return home with hospice and die in her own home.  Acknowledged this.  We then discussed alternative pathways moving forward such as long-term care placement with hospice support which would likely  entail an out-of-pocket cost for room and board.  Another option may be consideration of inpatient hospice though did review normal guidelines required for this and that 1 would have to be excepted by the medical director to attend inpatient hospice, could not just go there.  Spent time exploring these options.  Patient and sister would like to seek inpatient hospice evaluation and placement if able.  Patient and sister willing to pay out-of-pocket if needed.  First preference is for hospice home in [facility].  If this is not available, then consider [facility].  Noted would reach out to care team and involve hospice liaison for this evaluation.  Patient's only other concern was allowing her dog, Brenda Patel to visit.  Brenda Patel is a certified therapy dog who normally works in schools and retirement communities.  Provider followed up with the floor director regarding this and was informed PET scan visit as long as they are up-to-date on shots and someone is present to care for them.  Informed patient of this.  Patient notes that Brenda Patel is up-to-date on all of his vaccinations as it is required to be a therapy dog and his friends will plan to bring him in to take care of him.  Hopeful he can visit soon as he brings the patient a lot of joy.  All questions answered at that time.  Thanked patient and her Noralyn Pick for allowing me to visit with them today.  Noted palliative medicine team will continue to follow along with patient's care.  Updated bedside RN, hospitalist, hospice  liaison, and TOC regarding conversation with patient as described above.  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   Social History   Socioeconomic History   Marital status: Divorced    Spouse name: Not on file   Number  of children: Not on file   Years of education: Not on file   Highest education level: Not on file  Occupational History   Occupation: retired  Tobacco Use   Smoking status: Never   Smokeless tobacco: Never  Vaping Use   Vaping Use: Never used  Substance and Sexual Activity   Alcohol use: Yes    Comment: 3-4 glasses of wine per month/liquor 1x monthly   Drug use: Never   Sexual activity: Not on file  Other Topics Concern   Not on file  Social History Narrative   Not on file   Social Determinants of Health   Financial Resource Strain: Not on file  Food Insecurity: Not on file  Transportation Needs: Not on file  Physical Activity: Unknown (07/15/2018)   Exercise Vital Sign    Days of Exercise per Week: 5 days    Minutes of Exercise per Session: Not on file  Stress: Not on file  Social Connections: Not on file   History reviewed. No pertinent family history. Scheduled Meds:  Chlorhexidine Gluconate Cloth  6 each Topical Q0600   chlorpheniramine-HYDROcodone  5 mL Oral Q12H   docusate sodium  100 mg Oral BID   methylPREDNISolone (SOLU-MEDROL) injection  125 mg Intravenous Q12H   montelukast  10 mg Oral QPC supper   sodium chloride flush  3 mL Intravenous Q12H   Continuous Infusions:  lactated ringers 75 mL/hr at 10/25/22 0428   PRN Meds:.acetaminophen **OR** acetaminophen, albuterol, bisacodyl, guaiFENesin-dextromethorphan, hydrALAZINE, morphine injection, ondansetron **OR** ondansetron (ZOFRAN) IV, oxyCODONE, polyethylene glycol Allergies  Allergen Reactions   Cyanoacrylate Dermatitis    Dermabond Topical Skin Adhesive   Other Dermatitis    Bard Power Port   Wound Dressing Adhesive Itching and Rash    Dermabond    Codeine Nausea Only and Other (See Comments)    Severe constipation, also   Lentil Other (See Comments)    [place] - Allergy tested.   CBC:    Component Value Date/Time   WBC 8.0 10/25/2022 0441   HGB 11.6 (L) 10/25/2022 0441   HGB 12.0  10/14/2022 0948   HCT 35.0 (L) 10/25/2022 0441   PLT 135 (L) 10/25/2022 0441   PLT 145 (L) 10/14/2022 0948   MCV 95.9 10/25/2022 0441   NEUTROABS 6.1 10/23/2022 0951   LYMPHSABS 0.5 (L) 10/23/2022 0951   MONOABS 0.5 10/23/2022 0951   EOSABS 0.1 10/23/2022 0951   BASOSABS 0.0 10/23/2022 0951   Comprehensive Metabolic Panel:    Component Value Date/Time   NA 128 (L) 10/25/2022 0441   K 4.1 10/25/2022 0441   CL 94 (L) 10/25/2022 0441   CO2 24 10/25/2022 0441   BUN 5 (L) 10/25/2022 0441   CREATININE 0.40 (L) 10/25/2022 0441   CREATININE 0.55 09/30/2022 0947   GLUCOSE 136 (H) 10/25/2022 0441   CALCIUM 8.6 (L) 10/25/2022 0441   AST 20 09/30/2022 0947   ALT 13 09/30/2022 0947   ALKPHOS 95 09/30/2022 0947   BILITOT 0.4 09/30/2022  0947   PROT 6.9 09/30/2022 0947   ALBUMIN 4.0 09/30/2022 0947    Physical Exam: Vital Signs: BP (!) 158/97 (BP Location: Left Arm)   Pulse (!) 108   Temp 97.7 F (36.5 C) (Oral)   Resp 18   Ht '5\' 6"'$  (1.676 m)   Wt 68 kg   SpO2 100%   BMI 24.21 kg/m  SpO2: SpO2: 100 % O2 Device: O2 Device: Nasal Cannula O2 Flow Rate: O2 Flow Rate (L/min): 2 L/min Intake/output summary:  Intake/Output Summary (Last 24 hours) at 10/25/2022 1027 Last data filed at 10/25/2022 1014 Gross per 24 hour  Intake 120 ml  Output 200 ml  Net -80 ml   LBM: Last BM Date : 10/25/22 Baseline Weight: Weight: 68 kg Most recent weight: Weight: 68 kg  General: NAD, alert, pleasant, laying in bed Eyes: no drainage noted HENT:  moist mucous membranes Cardiovascular: RRR, no edema in LE b/l Respiratory: can have increased work of breathing when talking, not in respiratory distress, on [place] Abdomen: not distended Extremities: moving all appropriately  Skin: no rashes or lesions on visible skin Neuro: A&Ox4, following commands easily Psych: appropriately answers all questions          Palliative Performance Scale: 40%              Additional Data Reviewed: Recent Labs     10/24/22 0500 10/25/22 0441  WBC 7.9 8.0  HGB 11.1* 11.6*  PLT 126* 135*  NA 131* 128*  BUN 6 5*  CREATININE 0.45 0.40*    Imaging: [place] CHEST (PLEURAL EFFUSION) CLINICAL DATA:  Lung carcinoma, pleural masses and potential component right-sided pleural fluid by imaging. Evaluation for possible thoracentesis.  EXAM: CHEST ULTRASOUND  COMPARISON:  CTA of the chest on 10/22/2022  FINDINGS: Extensive sonographic evaluation of the right chest demonstrates only a small component of actual discernible fluid and a large component of complex tissue likely reflecting tumor in the right pleural space and hemithorax. There is no window or pocket of approachable pleural fluid for thoracentesis.  IMPRESSION: Ultrasound demonstrates only a small component of actual right pleural fluid and a large component of complex tissue likely reflecting tumor in the right pleural space and hemithorax. No thoracentesis was performed.  Electronically Signed   By: Aletta Edouard M.D.   On: 10/23/2022 15:53 DG Chest Portable 1 View CLINICAL DATA:  Shortness of breath.  EXAM: PORTABLE CHEST 1 VIEW  COMPARISON:  October 16, 2022.  October 22, 2022.  FINDINGS: Stable cardiomediastinal silhouette. Right internal jugular Port-A-Cath is noted. Left lung is clear. Large rounded density is noted in the right lung most consistent with pleural effusion as noted on prior CT scan. Some degree of right basilar atelectasis is present as well.  IMPRESSION: Large rounded density is again noted in the right lung most consistent with pleural effusion is noted on prior CT scan.  Electronically Signed   By: Marijo Conception M.D.   On: 10/23/2022 09:43    I personally reviewed recent imaging.   Palliative Care Assessment and Plan Summary of Established Goals of Care and Medical Treatment Preferences   Patient is a 61 year old female with a past medical history of sarcoid tumor of the right lung  diagnosed originally in 2019 s/p surgical removal and chemotherapy who was admitted on 10/23/2022 for management of shortness of breath. Mass was initially resected, she went without treatment for 18 months or so then she had a diaphragmatic mass , had radiation with  stabilization and then developed small tumors in the pleural cavity, treated with chemo. For cancer management, patient has been following up with Dr. Earlie Server and a sarcoma specialist at [facility].  Imaging on admission has shown large right pleural effusion.  Dr. Earlie Server already met with patient and discussed care.  Patient does not want to pursue any further cancer directed therapies. Palliative medicine consulted to assist with complex medical decision making and symptom management.  # Complex medical decision making/goals of care  -Extensive conversation held with patient while her sister, Brenda Patel, present at bedside as described above in HPI.  Patient no longer wants to seek cancer directed therapies for her rapidly progressing right sided lung sarcoma.  Patient's priority is comfort, symptom management, and quality of life at the end of her life.  Patient agreeing with transition to comfort focused care at this time.   -At this time we will discontinue interventions that are no longer focused on comfort such as IV fluids, imaging, or lab work.  Will instead focus on symptom management of pain, dyspnea, and agitation in the setting of end-of-life care.  -Patient has completed HCPOA paperwork.  If patient is unable to make medical decisions for herself, her primary medical decision maker is her Sister Brenda Patel.  Patient's alternate decision-maker if Brenda Patel is not available is her sister Brenda Patel  -  Code Status: DNR    -Gust that with patient's worsening cancer, if her heart were to stop or she were to stop breathing, interventions such as cardiac resuscitation and intubation would not allow her to resume quality of life she deems is  very important.  Patient would want to be allowed to have a natural death.  CODE STATUS appropriately updated from full code to DNR, comfort care.  # Symptom management   -Pain/Dyspnea, acute in the setting of end-of-life care                Patient was not on medications for pain previously.                               -Started IV morphine 1-2 mg IV every 30 minutes as needed pain or dyspnea.  Also added morphine 2 mg oral solution every 4 hours as needed.  Continue to adjust based on patient's symptom burden.      -Changed Solu-Medrol 125 mg to once daily in AM.                 -Anxiety/agitation, in the setting of end-of-life care                               -Started IV/po solution Ativan 0.5 mg every 4 hours as needed. Continue to adjust based on patient's symptom burden.     -Start oral clonazepam 0.125 p.o. nightly as needed.                 -Secretions, in the setting of end-of-life care                               -Started glycopyrrolate as needed.   -Constipation   -Start senna 1 tab daily. Needs bowel regimen while on opioids.   # Psycho-social/Spiritual Support:  - Support System: sisters; inquired with staff and patient will be allowed to have her dog (  therapy certified) visit her  -Will plan to bring patient copy of Gone [employer] My [place]  # Discharge Planning:  To Be Determined.  Incision to comfort focused care at this time.  Seeking inpatient hospice evaluation; patient prefers [facility] home.   Thank you for allowing the palliative care team to participate in the care Brenda Patel.  Chelsea Aus, DO Palliative Care Provider PMT # [phone]  If patient remains symptomatic despite maximum doses, please call PMT at [phone] between 0700 and 1900. Outside of these hours, please call attending, as PMT does not have night coverage.

## 2022-10-26 DIAGNOSIS — R0609 Other forms of dyspnea: Secondary | ICD-10-CM | POA: Diagnosis not present

## 2022-10-26 DIAGNOSIS — J90 Pleural effusion, not elsewhere classified: Secondary | ICD-10-CM | POA: Diagnosis not present

## 2022-10-26 DIAGNOSIS — C3491 Malignant neoplasm of unspecified part of right bronchus or lung: Secondary | ICD-10-CM | POA: Diagnosis not present

## 2022-10-26 DIAGNOSIS — Z515 Encounter for palliative care: Secondary | ICD-10-CM | POA: Diagnosis not present

## 2022-10-26 DIAGNOSIS — G4700 Insomnia, unspecified: Secondary | ICD-10-CM

## 2022-10-26 DIAGNOSIS — Z79899 Other long term (current) drug therapy: Secondary | ICD-10-CM

## 2022-10-26 MED ORDER — CLONAZEPAM 0.125 MG PO TBDP
0.1250 mg | ORAL_TABLET | Freq: Every day | ORAL | Status: DC
  Administered 2022-10-26: 0.125 mg via ORAL
  Filled 2022-10-26: qty 1

## 2022-10-26 NOTE — Progress Notes (Signed)
Daily Progress Note   Patient Name: Brenda Patel       Date: 10/26/2022 DOB: 1961/08/24  Age: 62 y.o. 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 Mass was initially resected, she went without treatment for 18 months or so then she had a diaphragmatic mass, had radiation with stabilization and then developed small tumors in the pleural cavity, treated with chemo. For cancer management, patient has been following up with Dr. Earlie Server and a sarcoma specialist at [facility].  Imaging on admission has shown large right pleural effusion.  Dr. Earlie Server already met with patient and discussed care.  Patient does not want to pursue any further cancer directed therapies. Palliative medicine consulted to assist with complex medical decision making and symptom management.  Subjective: Review of patient's medical record and updates from bedside nursing staff prior to seeing patient.  Present for visit are patient, her sister, a friend, and Counselling psychologist. Ms. Vanasse is awake and engages about how her rescue dog Brenda Patel has been a much needed support throughout her cancer diagnosis and treatment. Despite her own faltering health, Ms. Filipowicz and Brenda Patel have provided therapy visits in local health care facilities, churches, and schools.  Within a few minutes of conversation, patient begins to experience difficulty  speaking due to severe coughing. Coughing episode despite recent Robitussin administration. This is much worse than during previous visit from palliative team. Patient is allowed to rest and encouraged to take PRN Morphine. She states that she wants to try to wait until after an afternoon visit with her nephew to take Morphine. Clonazepam changed from PRN to scheduled nightly to promote rest.  Physical Exam Pulmonary:     Effort: Accessory muscle usage present.     Breath sounds: Stridor present. Decreased breath sounds present.     Comments: Coughing Skin:    General: Skin is warm and dry.     Coloration: Skin is pale.  Neurological:     Mental Status: She is alert  and oriented to person, place, and time.     Vital Signs: BP (!) 146/91 (BP Location: Right Arm)   Pulse 100   Temp 98.2 F (36.8 C) (Oral)   Resp 16   Ht '5\' 6"'$  (1.676 m)   Wt 68 kg   SpO2 99%   BMI 24.21 kg/m  SpO2: SpO2: 99 % O2 Device: O2 Device: Nasal Cannula O2 Flow Rate: O2 Flow Rate (L/min): 2 L/min  Intake/output summary:  Intake/Output Summary (Last 24 hours) at 10/26/2022 1348 Last data filed at 10/26/2022 1336 Gross per 24 hour  Intake 600 ml  Output --  Net 600 ml   LBM: Last BM Date : 10/25/22 Baseline Weight: Weight: 68 kg Most recent weight: Weight: 68 kg       Palliative Assessment/Data: 40%      Patient Active Problem List   Diagnosis Date Noted   Need for emotional support 10/25/2022   High risk medication use 10/25/2022   Shortness of breath 10/25/2022   Cancer associated pain 10/25/2022   Anxiety 10/25/2022   Counseling and coordination of care 10/25/2022   Goals of care, counseling/discussion 10/25/2022   Palliative care encounter 10/25/2022   Port-A-Cath in place 09/30/2022   Neutropenia ([place]) 12/31/2021   Transfusion reaction 12/19/2021   Thrombocytopenia ([place]) 12/19/2021   Asthma 12/19/2021   Hyperglycemia 12/19/2021   Sarcomatoid carcinoma of lung, 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 Mass was initially resected, she went without treatment for 18 months or so then she had a diaphragmatic mass , had radiation with stabilization and then developed small tumors in the pleural cavity, treated with chemo. For cancer management, patient has been following up with Dr. Earlie Server and a sarcoma specialist at [facility].  Imaging on admission has shown large right pleural effusion.  Dr. Earlie Server already met with patient and discussed care.  Patient does not want to pursue any further cancer directed therapies. Palliative medicine consulted to assist with complex medical decision making and symptom management.   Assessment/Recommendations/Plan  # Complex medical decision making/goals of care                -Patient transitioned to comfort focused care on 10/25/22 after extensive conversation.                 -Patient has completed HCPOA paperwork.  If patient is unable to make medical decisions for herself, her primary medical decision maker is her Sister Brenda Patel.  Patient's alternate decision-maker if Brenda Patel is not available is her sister Brenda Patel    Code Status: DNR  Pain/Dyspnea, acute in the setting of end-of-life care -Continue morphine 1-2 mg IV every 30 minutes as needed pain or dyspnea. Morphine 2 mg oral solution every 4 hours as needed.  Continue to adjust based on patient's symptom burden.              -Solu-Medrol 125 mg to once daily q am.    Anxiety/agitation, in  the setting of end-of-life care -Continue Ativan 0.5 mg PO/IV every 4 hours as needed. Will monitor and adjust based on patient's symptom burden.   -Start scheduled oral clonazepam 0.125 PO q hs.   Secretions-in the setting of end-of-life -Glycopyrrolate as needed.  Constipation -Continue Senna 1 tab daily. Needs bowel regimen while on opioids.    Psycho-social/Spiritual Support:  - Support System: sisters, dog Brenda Patel             -[facility] booklets provided to patient and her sister today  Code Status: DNR  Prognosis: <6 months. Hospice appropraite.   Discharge Planning: To be determined. Patient and family desire to focus on comfort at this time. Seeking inpatient hospice evaluation; patient prefers [facility] and is open to [facility]. Both facilities run by [facility].   Care plan was discussed with patient and her sister Brenda Patel.  Thank you for allowing the palliative care team to participate in the care of [name].  Moss Mc, RN, MSN, [facility] / NP Student Palliative Medicine  If patient remains symptomatic despite maximum doses, please call PMT at [phone] between 0700 and 1900. Outside of these hours, please call attending, as PMT does not have night coverage.   I have reviewed, seen, examined, and discussed the care of this patient in detail with Moss Mc, RN, MSN, [facility] / NP Student including pertinent patient records, physical exam findings, and data. I have updated the note accordingly and agree with details of the encounter stated in above note(GC).   Adjusting patient's medications at this time. Personally spoke with [facility] representative about evaluation. Continuing comfort focused care.   Chelsea Aus, DO Palliative Care Provider [phone]

## 2022-10-26 NOTE — Progress Notes (Signed)
PROGRESS NOTE    Brenda Patel  [id number] DOB: 1960-09-10 DOA: 10/23/2022 PCP: [name], FNP    Brief Narrative:  This 62 years old female with PMH significant for depression, sarcoma (2019) with sarcomatoid tumor of the lung currently presented in the ED with shortness of breath.  She has cancer around the right lung, taking Avastin and Temodar.  Patient went to the ER 1 month ago for shortness of breath and she had a thoracocentesis with removal of 150 cc.  She has been mildly short of breath since that thoracocentesis.  Last week she had a chest x-ray still showed pleural effusion.  She was scheduled for thoracocentesis on 2/26 but unable to drain the fluid.  Her breathing has gotten worse particularly with orthopnea.  She has called Dr. Julien Nordmann, had a CT chest in the afternoon with larger effusion or something ongoing symptoms. She presented in the ED again.  She has sarcoma specialist at [facility].  It was initially resected, She went without treatment for 18 months or so then she had a diaphragmatic mass , had radiation with stabilization and then developed small tumors in the pleural cavity, treated with chemo.  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 Julien Nordmann.  Seen by IR on 2/26, no drainable pocket.  CTA negative for PE, has large right pleural effusion.  Patient admitted for further evaluation. Goals of care discussion completed with oncologist, herself,  patient's sister and palliative care.  Care transitioned to full comfort care.  Patient awaiting discharge to [facility].   Assessment & Plan:   Principal Problem:   Pleural effusion Active Problems:   Need for emotional support   High risk medication use   Shortness of breath   Cancer associated pain   Anxiety   Counseling and coordination of care   Goals of care, counseling/discussion   Palliative care encounter   Sarcomatoid lung cancer: -Patient with worsening SOB in the setting of known sarcomatoid lung  CA -She was found to have a large pleural effusion but this was not a drainable collection earlier this week -She had a CT with again concern for worsening effusion and she was sent in by oncology for consideration of drainage.  -Patient was again evaluated by IR.  Findings are more consistent with evolving mass in the right chest not effusion.  No thoracocentesis attempted.  Ultrasound again demonstrated large tumor burden in the right chest -She was admitted to telemetry -Unfortunately, findings appear consistent more with evolving mass in the R chest rather than effusion -As per Dr. Julien Nordmann unfortunately the disease has progressed significantly last few weeks.  He will reach out to Dr. Dawna Part to see if he has any other suggestions.  If no other options for management of her condition he would recommend palliative care and hospice referral. -It is especially unfortunate that this is evolving despite active treatment with Avastin and Temodar -She reports having already had radiation and believes she is not able to be treated again with radiation -Symptomatic control with O2 for now. -Goals of care discussion completed with oncologist, herself,  patient's sister and palliative care.  Care transitioned to full comfort care. Patient awaiting discharge to [facility].   Elevated BP -No known diagnosis of HTN -This was also noted recently at her 2/19 oncology visit.     DVT prophylaxis: SCDs Code Status:Full code Family Communication: Sister at bed side. Disposition Plan:   Admitted for Sarcoma of the lung with pleural effusion.  Oncology  and IR is consulted. Goals of care discussion completed with oncologist, herself,  patient's sister and palliative care.  Care transitioned to full comfort care.  Consultants:  IR Oncology  Procedures:  Antimicrobials:  Anti-infectives (From admission, onward)    None       Subjective: Patient was seen and examined at bedside.  Overnight events  noted.   Patient reports not feeling well.  She knows she has advanced disease. Patient wants to transition care to full comfort care and hospice.   Objective: Vitals:   10/25/22 0653 10/25/22 1320 10/25/22 2048 10/26/22 0518  BP: (!) 158/97 (!) 158/101 (!) 151/92 (!) 146/91  Pulse: (!) 108 (!) 109 (!) 106 100  Resp: '18 18 18 16  '$ Temp: 97.7 F (36.5 C) 98.4 F (36.9 C) 98.6 F (37 C) 98.2 F (36.8 C)  TempSrc: Oral Oral Oral Oral  SpO2: 100% 97% 98% 99%  Weight:      Height:        Intake/Output Summary (Last 24 hours) at 10/26/2022 1306 Last data filed at 10/26/2022 [id number] Gross per 24 hour  Intake 360 ml  Output --  Net 360 ml   Filed Weights   10/23/22 0906  Weight: 68 kg    Examination:  General exam: Appears comfortable, deconditioned, chronically ill looking. Respiratory system: CTA bilaterally, respiratory for normal, RR 18 Cardiovascular system: S1 & S2 heard, regular rate and rhythm, no murmur. Gastrointestinal system: Abdomen is soft, non tender, non distended, BS+ Central nervous system: Alert and oriented x 3. No focal neurological deficits. Extremities: No edema, no cyanosis, no clubbing Skin: No rashes, lesions or ulcers Psychiatry: Judgement and insight appear normal. Mood & affect appropriate.     Data Reviewed: I have personally reviewed following labs and imaging studies  CBC: Recent Labs  Lab 10/23/22 0951 10/24/22 0500 10/25/22 0441  WBC 7.2 7.9 8.0  NEUTROABS 6.1  --   --   HGB 11.3* 11.1* 11.6*  HCT 34.2* 34.0* 35.0*  MCV 98.6 98.6 95.9  PLT 129* 126* [id number]*   Basic Metabolic Panel: Recent Labs  Lab 10/23/22 0951 10/24/22 0500 10/25/22 0441  NA 134* 131* 128*  K 3.4* 3.2* 4.1  CL 100 98 94*  CO2 '26 26 24  '$ GLUCOSE 95 99 136*  BUN 8 6 5*  CREATININE 0.56 0.45 0.40*  CALCIUM 8.3* 8.1* 8.6*  MG  --   --  2.1  PHOS  --   --  3.3   GFR: Estimated Creatinine Clearance: 70.9 mL/min (A) (by C-G formula based on SCr of 0.4 mg/dL  (L)). Liver Function Tests: No results for input(s): "AST", "ALT", "ALKPHOS", "BILITOT", "PROT", "ALBUMIN" in the last 168 hours. No results for input(s): "LIPASE", "AMYLASE" in the last 168 hours. No results for input(s): "AMMONIA" in the last 168 hours. Coagulation Profile: No results for input(s): "INR", "PROTIME" in the last 168 hours. Cardiac Enzymes: No results for input(s): "CKTOTAL", "CKMB", "CKMBINDEX", "TROPONINI" in the last 168 hours. BNP (last 3 results) No results for input(s): "PROBNP" in the last 8760 hours. HbA1C: No results for input(s): "HGBA1C" in the last 72 hours. CBG: No results for input(s): "GLUCAP" in the last 168 hours. Lipid Profile: No results for input(s): "CHOL", "HDL", "LDLCALC", "TRIG", "CHOLHDL", "LDLDIRECT" in the last 72 hours. Thyroid Function Tests: No results for input(s): "TSH", "T4TOTAL", "FREET4", "T3FREE", "THYROIDAB" in the last 72 hours. Anemia Panel: No results for input(s): "VITAMINB12", "FOLATE", "FERRITIN", "TIBC", "IRON", "RETICCTPCT" in the last 72 hours. Sepsis  Labs: No results for input(s): "PROCALCITON", "LATICACIDVEN" in the last 168 hours.  No results found for this or any previous visit (from the past 240 hour(s)).  Radiology Studies: No results found.  Scheduled Meds:  Chlorhexidine Gluconate Cloth  6 each Topical Q0600   clonazepam  0.125 mg Oral Q2200   methylPREDNISolone (SOLU-MEDROL) injection  125 mg Intravenous Daily   montelukast  10 mg Oral QPC supper   senna  1 tablet Oral Daily   sodium chloride flush  3 mL Intravenous Q12H   Continuous Infusions:     LOS: 3 days    Time spent: 35 mins    Navraj Dreibelbis, MD [facility]   If 7PM-7AM, please contact night-coverage

## 2022-10-27 DIAGNOSIS — C3491 Malignant neoplasm of unspecified part of right bronchus or lung: Secondary | ICD-10-CM | POA: Diagnosis not present

## 2022-10-27 DIAGNOSIS — Z515 Encounter for palliative care: Secondary | ICD-10-CM | POA: Diagnosis not present

## 2022-10-27 DIAGNOSIS — J90 Pleural effusion, not elsewhere classified: Secondary | ICD-10-CM | POA: Diagnosis not present

## 2022-10-27 DIAGNOSIS — F419 Anxiety disorder, unspecified: Secondary | ICD-10-CM | POA: Diagnosis not present

## 2022-10-27 MED ORDER — MORPHINE SULFATE 10 MG/5ML PO SOLN
2.0000 mg | ORAL | Status: DC
  Administered 2022-10-27 – 2022-10-28 (×4): 2 mg via ORAL
  Filled 2022-10-27 (×4): qty 5

## 2022-10-27 MED ORDER — SENNA 8.6 MG PO TABS
2.0000 | ORAL_TABLET | Freq: Two times a day (BID) | ORAL | Status: DC
  Administered 2022-10-27 – 2022-10-28 (×2): 17.2 mg via ORAL
  Filled 2022-10-27 (×2): qty 2

## 2022-10-27 MED ORDER — CLONAZEPAM 0.125 MG PO TBDP
0.2500 mg | ORAL_TABLET | Freq: Every day | ORAL | Status: DC
  Administered 2022-10-27: 0.25 mg via ORAL
  Filled 2022-10-27: qty 2

## 2022-10-27 MED ORDER — MORPHINE SULFATE 10 MG/5ML PO SOLN: 1.0000 mg | ORAL | Status: DC | PRN

## 2022-10-27 NOTE — Progress Notes (Signed)
PROGRESS NOTE    Brenda Patel  [id number] DOB: 18-Oct-1960 DOA: 10/23/2022 PCP: [name], FNP    Brief Narrative:  This 62 years old female with PMH significant for depression, sarcoma (2019) with sarcomatoid tumor of the lung currently presented in the ED with shortness of breath.  She has cancer around the right lung, taking Avastin and Temodar.  Patient went to the ER 1 month ago for shortness of breath and she had a thoracocentesis with removal of 150 cc.  She has been mildly short of breath since that thoracocentesis.  Last week she had a chest x-ray still showed pleural effusion.  She was scheduled for thoracocentesis on 2/26 but unable to drain the fluid.  Her breathing has gotten worse particularly with orthopnea.  She has called Dr. Julien Nordmann, had a CT chest in the afternoon with larger effusion or something ongoing symptoms. She presented in the ED again.  She has sarcoma specialist at [facility].  It was initially resected, She went without treatment for 18 months or so then she had a diaphragmatic mass , had radiation with stabilization and then developed small tumors in the pleural cavity, treated with chemo.  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 Julien Nordmann.  Seen by IR on 2/26, no drainable pocket.  CTA negative for PE, has large right pleural effusion.  Patient admitted for further evaluation. Goals of care discussion completed with oncologist, herself,  patient's sister and palliative care.  Care transitioned to full comfort care.  Patient awaiting discharge to hospice home.   Assessment & Plan:   Principal Problem:   Pleural effusion Active Problems:   Need for emotional support   High risk medication use   Shortness of breath   Cancer associated pain   Anxiety   Counseling and coordination of care   Goals of care, counseling/discussion   Palliative care encounter   Insomnia   Dyspnea on exertion   Medication management   Sarcomatoid lung cancer: -Patient with  worsening SOB in the setting of known sarcomatoid lung CA -She was found to have a large pleural effusion but this was not a drainable collection earlier this week -She had a CT with again concern for worsening effusion and she was sent in by oncology for consideration of drainage.  -Patient was again evaluated by IR.  Findings are more consistent with evolving mass in the right chest not effusion.  No thoracocentesis attempted.  Ultrasound again demonstrated large tumor burden in the right chest -She was admitted to telemetry -Unfortunately, findings appear consistent more with evolving mass in the R chest rather than effusion -As per Dr. Julien Nordmann unfortunately the disease has progressed significantly last few weeks.  He will reach out to Dr. Dawna Part to see if he has any other suggestions.  If no other options for management of her condition he would recommend palliative care and hospice referral. -It is especially unfortunate that this is evolving despite active treatment with Avastin and Temodar -She reports having already had radiation and believes she is not able to be treated again with radiation -Symptomatic control with O2 for now. -Goals of care discussion completed with oncologist, herself,  patient's sister and palliative care.  Care transitioned to full comfort care. Patient awaiting discharge to hospice home, [facility].   Elevated BP -No known diagnosis of HTN -This was also noted recently at her 2/19 oncology visit.     DVT prophylaxis: SCDs Code Status:Full code  Family Communication: Sister at bed side. Disposition Plan:   Admitted for Sarcoma of the lung with pleural effusion.  Oncology and IR is consulted. Goals of care discussion completed with oncologist, herself,  patient's sister and palliative care.  Care transitioned to full comfort care.  Consultants:  IR Oncology  Procedures:  Antimicrobials:  Anti-infectives  (From admission, onward)    None       Subjective: Patient was seen and examined at bedside.  Overnight events noted. Patient reports feeling fine,  still reports having cough.  She notes she has advanced disease. Patient wants to transition her care to full comfort care and hospice.   Objective: Vitals:   10/25/22 1320 10/25/22 2048 10/26/22 0518 10/27/22 0423  BP: (!) 158/101 (!) 151/92 (!) 146/91 (!) 156/96  Pulse: (!) 109 (!) 106 100 (!) 104  Resp: '18 18 16 16  '$ Temp: 98.4 F (36.9 C) 98.6 F (37 C) 98.2 F (36.8 C) 98.3 F (36.8 C)  TempSrc: Oral Oral Oral Oral  SpO2: 97% 98% 99% 96%  Weight:      Height:        Intake/Output Summary (Last 24 hours) at 10/27/2022 1120 Last data filed at 10/26/2022 2253 Gross per 24 hour  Intake 243 ml  Output --  Net 243 ml   Filed Weights   10/23/22 0906  Weight: 68 kg    Examination:  General exam: Appears chronically ill looking, deconditioned, not in any acute distress.  Comfortable Respiratory system: CTA bilaterally, respiratory for normal, RR 18 Cardiovascular system: S1 & S2 heard, regular rate and rhythm, no murmur. Gastrointestinal system: Abdomen is soft, non tender, non distended, BS+ Central nervous system: Alert and oriented x 3. No focal neurological deficits. Extremities: No edema, no cyanosis, no clubbing Skin: No rashes, lesions or ulcers Psychiatry: Judgement and insight appear normal. Mood & affect appropriate.     Data Reviewed: I have personally reviewed following labs and imaging studies  CBC: Recent Labs  Lab 10/23/22 0951 10/24/22 0500 10/25/22 0441  WBC 7.2 7.9 8.0  NEUTROABS 6.1  --   --   HGB 11.3* 11.1* 11.6*  HCT 34.2* 34.0* 35.0*  MCV 98.6 98.6 95.9  PLT 129* 126* [id number]*   Basic Metabolic Panel: Recent Labs  Lab 10/23/22 0951 10/24/22 0500 10/25/22 0441  NA 134* 131* 128*  K 3.4* 3.2* 4.1  CL 100 98 94*  CO2 '26 26 24  '$ GLUCOSE 95 99 136*  BUN 8 6 5*  CREATININE 0.56 0.45 0.40*   CALCIUM 8.3* 8.1* 8.6*  MG  --   --  2.1  PHOS  --   --  3.3   GFR: Estimated Creatinine Clearance: 70.9 mL/min (A) (by C-G formula based on SCr of 0.4 mg/dL (L)). Liver Function Tests: No results for input(s): "AST", "ALT", "ALKPHOS", "BILITOT", "PROT", "ALBUMIN" in the last 168 hours. No results for input(s): "LIPASE", "AMYLASE" in the last 168 hours. No results for input(s): "AMMONIA" in the last 168 hours. Coagulation Profile: No results for input(s): "INR", "PROTIME" in the last 168 hours. Cardiac Enzymes: No results for input(s): "CKTOTAL", "CKMB", "CKMBINDEX", "TROPONINI" in the last 168 hours. BNP (last 3 results) No results for input(s): "PROBNP" in the last 8760 hours. HbA1C: No results for input(s): "HGBA1C" in the last 72 hours. CBG: No results for input(s): "GLUCAP" in the last 168 hours. Lipid Profile: No results for input(s): "CHOL", "HDL", "LDLCALC", "TRIG", "CHOLHDL", "LDLDIRECT" in the last 72 hours. Thyroid Function Tests: No results  for input(s): "TSH", "T4TOTAL", "FREET4", "T3FREE", "THYROIDAB" in the last 72 hours. Anemia Panel: No results for input(s): "VITAMINB12", "FOLATE", "FERRITIN", "TIBC", "IRON", "RETICCTPCT" in the last 72 hours. Sepsis Labs: No results for input(s): "PROCALCITON", "LATICACIDVEN" in the last 168 hours.  No results found for this or any previous visit (from the past 240 hour(s)).  Radiology Studies: No results found.  Scheduled Meds:  Chlorhexidine Gluconate Cloth  6 each Topical Q0600   clonazepam  0.125 mg Oral Q2200   methylPREDNISolone (SOLU-MEDROL) injection  125 mg Intravenous Daily   montelukast  10 mg Oral QPC supper   senna  1 tablet Oral Daily   sodium chloride flush  3 mL Intravenous Q12H   Continuous Infusions:     LOS: 4 days    Time spent: 35 mins    Diella Gillingham, MD [facility]   If 7PM-7AM, please contact night-coverage

## 2022-10-27 NOTE — Progress Notes (Signed)
Daily Progress Note   Patient Name: Brenda Patel       Date: 10/27/2022 DOB: 09/13/1960  Age: 62 y.o. MRN#: [id number] Attending Physician: Shawna Clamp, MD Primary Care Physician: [name], FNP Admit Date: 10/23/2022 Length of Stay: 4 days  Reason for Consultation/Follow-up: Terminal Care  Subjective:   CC: Worsening cough and dyspnea. Following up regarding symptom management and goals of care.   Subjective:  Reviewed EMR prior to seeing patient.  At time of EMR review, patient had received morphine solution 2 mg x 2 doses.  Patient continues to receive 0.125 oral clonazepam at bedtime.  Presented to bedside to see patient.  Spent time reviewing patient's symptoms.  Patient noted increased cough and shortness of breath today.  Discussed morphine management.  Patient wanting to try scheduled oral morphine every 4 hours at this time.  Noted if this is helping to manage her symptoms, may consider long-acting opioid.  Patient agreeing with plan at this time.  Patient also notes last bowel movement was almost 2 days ago.  Increase senna at this time to assist with management.  Patient does not want to take MiraLAX.  Patient feels the clonazepam at its minimal dose does help sleep, willing to consider increase at this time.  Noted we will try minimally increased dose tonight.  Has been able to enjoy time with visiting family and friends though is becoming more fatigued much more quickly.  Acknowledged and normalized this process. Discussed plan of continued evaluation for hospice facility.  Patient's primary choice is the inpatient hospice unit in [facility].  Patient considering other options for inpatient support if needed.  Patient continuing discussions with sister regarding this.  Provider noted would  continue to follow-up with [facility] and hospice of the [facility].  All questions answered at that time.  The medicine team will continue to follow along with patient's  journey.  Review of Systems Cough, shortness of breath, constipation Objective:   Vital Signs:  BP (!) 162/95 (BP Location: Right Arm)   Pulse (!) 108   Temp 98.6 F (37 C) (Oral)   Resp 16   Ht '5\' 6"'$  (1.676 m)   Wt 68 kg   SpO2 98%   BMI 24.21 kg/m   Physical Exam: General: NAD, alert, pleasant Eyes: conjunctiva clear, anicteric sclera HENT: normocephalic, atraumatic, moist mucous membranes Cardiovascular: RRR, no edema in LE b/l Respiratory: slightly increased work of breathing noted, not in respiratory distress, cough present  Abdomen: not distended Skin: no rashes or lesions on visible skin Neuro: A&Ox4, following commands easily Psych: appropriately answers all questions  Imaging:  I personally reviewed recent imaging.   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 Mass was initially resected, she went without treatment for 18 months or so then she had a diaphragmatic mass , had radiation with stabilization and then developed small tumors in the pleural cavity, treated with chemo. For cancer management, patient has been following up with Dr. Earlie Server and a sarcoma specialist at [facility].  Imaging on admission has shown large right pleural effusion.  Dr. Earlie Server already met with patient and discussed care.  Patient does not want to pursue any further cancer directed therapies. Palliative medicine consulted to assist with complex medical decision making and symptom management.  Recommendations/Plan: # Complex medical decision making/goals of care:     -Patient transitioned to comfort focused care on 10/25/22 after extensive conversation.                 -Patient has completed HCPOA paperwork.  If patient is unable to make medical decisions for herself, her  primary medical decision maker is her Sister Brenda Patel.  Patient's alternate decision-maker if Brenda Patel is not available is her sister Brenda Patel               Code Status: DNR  # Symptom management: -Pain/Dyspnea, acute in the setting of end-of-life care Worsening cough and shortness of breath today. -Schedule morphine solution 2 mg oral solution every 4 hours. Add morphine solution '1mg'$  q4hrs prn. Continue IV morphine 1-'2mg'$  q61mns prn breakthrough dyspnea or pain.  Continue to adjust based on patient's symptom burden.              -Solu-Medrol 125 mg to once daily q am.   Anxiety/agitation, in the setting of end-of-life care -Continue Ativan 0.5 mg PO/IV every 4 hours as needed. Will monitor and adjust based on patient's symptom burden.   -Increase scheduled oral clonazepam 0.25 PO q hs.   Secretions-in the setting of end-of-life -Glycopyrrolate as needed.   Constipation -Increase Senna 2 tabs BID. Needs bowel regimen while on opioids.   # Psychosocial Support: - Support System: sisters, dog DDamita Dunnings            -[facility] booklets provided to patient and her sister on 3/2  # Discharge Planning: To be determined. Patient and family desire to focus on comfort at this time. Seeking inpatient hospice evaluation; [facility].   Discussed with: patient, TOC  Thank you for allowing the palliative care team to participate in the care SInocencio Homes  LChelsea Aus DO Palliative Care Provider PMT # [phone] If patient remains symptomatic despite maximum doses, please call PMT at [phone] 0700 and 1900. Outside of these hours, please call attending, as PMT does not have night coverage.  *Please note that this is a verbal dictation therefore any spelling or grammatical errors are due to the "[place]" system interpretation.

## 2022-10-27 NOTE — TOC Progression Note (Signed)
Transition of Care [facility]) - Progression Note    Patient Details  Name: Brenda Patel MRN: [id number] Date of Birth: [birth year]  Transition [facility]) CM/SW Contact  Rodney Booze, [place] Phone Number: 10/27/2022, 3:08 PM  Clinical Narrative:    CSW received a call from [place] from [facility] at this time there are no beds available. TOC will need to follow up tomorrow to check on status.         Expected Discharge Plan and Services                                               Social Determinants of Health (SDOH) Interventions SDOH Screenings   Physical Activity: Unknown (07/15/2018)  Tobacco Use: Low Risk  (10/23/2022)    Readmission Risk Interventions     No data to display

## 2022-10-28 ENCOUNTER — Ambulatory Visit: Payer: BC Managed Care – PPO | Admitting: Internal Medicine

## 2022-10-28 ENCOUNTER — Inpatient Hospital Stay: Payer: BC Managed Care – PPO

## 2022-10-28 DIAGNOSIS — J90 Pleural effusion, not elsewhere classified: Secondary | ICD-10-CM | POA: Diagnosis not present

## 2022-10-28 DIAGNOSIS — R0609 Other forms of dyspnea: Secondary | ICD-10-CM | POA: Diagnosis not present

## 2022-10-28 DIAGNOSIS — Z515 Encounter for palliative care: Secondary | ICD-10-CM | POA: Diagnosis not present

## 2022-10-28 DIAGNOSIS — C3491 Malignant neoplasm of unspecified part of right bronchus or lung: Secondary | ICD-10-CM | POA: Diagnosis not present

## 2022-10-28 MED ORDER — MORPHINE SULFATE 10 MG/5ML PO SOLN: 2.0000 mg | ORAL | Status: DC | PRN

## 2022-10-28 MED ORDER — GLYCOPYRROLATE 1 MG PO TABS: 1.0000 mg | ORAL_TABLET | ORAL | 1 refills | Status: DC | PRN

## 2022-10-28 MED ORDER — MORPHINE SULFATE ER 15 MG PO TBCR: 15.0000 mg | EXTENDED_RELEASE_TABLET | Freq: Every day | ORAL | Status: DC

## 2022-10-28 MED ORDER — MORPHINE SULFATE ER 15 MG PO TBCR: 15.0000 mg | EXTENDED_RELEASE_TABLET | ORAL | Status: DC

## 2022-10-28 MED ORDER — MORPHINE SULFATE 10 MG/5ML PO SOLN
2.0000 mg | ORAL | Status: DC
  Administered 2022-10-28: 2 mg via ORAL
  Filled 2022-10-28: qty 5

## 2022-10-28 NOTE — Progress Notes (Signed)
   This pt was referred for hospice services at our [place] home in [facility]. I have spoken to the pt and sister regarding services and they are in agreement with hospice care and full comfort. The pt is able to sign her own paperwork. I have spoken to our MD Dr. Beverlyn Roux and the pt has been approved and we can offer a bed today. Webb Silversmith RN [phone]

## 2022-10-28 NOTE — Discharge Instructions (Signed)
Patient is being discharged to [facility] [place] for comfort care/hospice care.

## 2022-10-28 NOTE — TOC Transition Note (Signed)
Transition of Care [facility]) - CM/SW Discharge Note   Patient Details  Name: Brenda Patel MRN: [id number] Date of Birth: 08/10/1961  [facility] Care [facility]) CM/SW Contact:  Angelita Ingles, RN Phone [phone]  10/28/2022, 3:08 PM   Clinical Narrative:    CM has received confirmation from [facility] with [place] that patient is ok to discharge to the facility. MD to come to unit to sign DNR. Transportation has been set up via [place]. CM attempted to call sister Rise Paganini there is no answer voicemail has been left. No other needs noted at this time. TOC will sign off.    Final next level of care: [place] Barriers to Discharge: No Barriers Identified   Patient Goals and CMS Choice      Discharge Placement                Patient chooses bed at:  [facility]) Patient to be transferred to facility by: [place] Name of family member notified: Sinea Carchi Patient and family notified of of transfer: 10/28/22  Discharge Plan and Services Additional resources added to the After Visit Summary for                  DME Arranged: N/A DME Agency: NA       HH Arranged: NA HH Agency: NA        Social Determinants of Health (SDOH) Interventions SDOH Screenings   Food Insecurity: No Food Insecurity (10/28/2022)  Housing: Low Risk  (10/28/2022)  Transportation Needs: No Transportation Needs (10/28/2022)  Utilities: Not At Risk (10/28/2022)  Physical Activity: Unknown (07/15/2018)  Tobacco Use: Low Risk  (10/23/2022)     Readmission Risk Interventions     No data to display

## 2022-10-28 NOTE — Discharge Summary (Signed)
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  She has cancer around the right lung, taking Avastin and Temodar.  Patient went to the ER 1 month ago for shortness of breath and she had a thoracocentesis with removal of 150 cc.  She has been mildly short of breath since that thoracocentesis.  Last week she had a chest x-ray still showed pleural effusion.  She was scheduled for thoracocentesis on 2/26 but unable to drain the fluid.  Her breathing has gotten worse particularly with orthopnea.  She has called Dr. Julien Nordmann, had a CT chest in the afternoon with larger effusion or something ongoing symptoms. She presented in the ED again.  She has sarcoma specialist at [facility].  It was initially resected, She went without treatment for 18 months or so then she had a diaphragmatic mass , had radiation with stabilization and then developed small tumors in the pleural cavity, treated with chemo.   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 Julien Nordmann.  Seen by IR on 2/26, no drainable pocket.  CTA negative for PE, has large right pleural effusion.  Patient admitted for further evaluation. Goals of care discussion completed with oncologist, herself,  patient's sister and palliative care.  Care transitioned to full comfort care.  Patient is being discharged to [facility] [place] in [place].  Discharge Diagnoses:  Principal Problem:   Pleural effusion Active Problems:   Need for emotional support   High risk medication use   Shortness of breath   Cancer associated pain   Anxiety   Counseling and coordination of care   Goals of care, counseling/discussion   Palliative care encounter   Insomnia   Dyspnea on exertion   Medication management  Sarcomatoid lung cancer: -Patient with worsening SOB in the setting of known sarcomatoid lung CA -She was found to have a large pleural effusion but this was not a drainable collection earlier this week -She had a CT with again concern for worsening effusion and she was sent in by oncology for consideration of drainage.  -Patient was again evaluated by IR.  Findings are more consistent with evolving mass in the right chest not effusion.  No thoracocentesis attempted.  Ultrasound again demonstrated large tumor burden in the right chest -She was admitted to telemetry -Unfortunately, findings appear consistent more with evolving mass in the R chest rather than effusion -As per Dr. Julien Nordmann unfortunately the disease has progressed significantly last few weeks.  He will reach out to Dr. Dawna Part to see if he has any other suggestions.  If no other options for management of her condition he would recommend palliative care and hospice referral. -It is especially unfortunate that this is evolving despite active treatment with Avastin and Temodar -She reports having already had radiation and believes she is not able to be treated again with radiation -Symptomatic control with O2 for now. -Goals of care discussion completed with oncologist, herself,  patient's sister and palliative care.  Care transitioned to full comfort care. Patient awaiting discharge to hospice home, Patient prefers [facility] [place] [place] hosp[facility] care. -Patient is being discharged to [facility] [place] in [facility] for comfort care.   Elevated BP -No known  diagnosis of HTN -This was also noted recently at her 2/19 oncology visit.  Discharge Instructions  Discharge Instructions     Call MD for:  difficulty breathing, headache or visual disturbances   Complete by: As directed    Call MD for:  difficulty breathing, headache or visual disturbances   Complete by: As directed    Call MD for:  persistant dizziness or light-headedness   Complete by: As directed    Call MD for:  persistant nausea and vomiting   Complete by: As directed    Call MD for:  persistant nausea and vomiting   Complete by: As directed    Diet - low sodium heart healthy   Complete by: As directed    Diet - low sodium heart healthy   Complete by: As directed    Diet Carb Modified   Complete by: As directed    Diet Carb Modified   Complete by: As directed    Discharge instructions   Complete by: As directed    Patient is being discharged to [facility] [place] for hospice care/comfort care.   Discharge instructions   Complete by: As directed    Patient is being discharged to hospice facility for comfort care.   Increase activity slowly   Complete by: As directed    Increase activity slowly   Complete by: As directed       Allergies as of 10/28/2022       Reactions   Cyanoacrylate Dermatitis   Dermabond Topical Skin Adhesive   Other Dermatitis   Bard Power Port   Wound Dressing Adhesive Itching, Rash   Dermabond   Codeine Nausea Only, Other (See Comments)   Severe constipation, also   Lentil Other (See Comments)   [place] Beans - Allergy tested.        Medication List     STOP taking these medications    Calcium Citrate 333 MG Tabs   fluticasone 50 MCG/ACT nasal spray Commonly known as: FLONASE   IRON-VITAMIN C PO   meloxicam 15 MG tablet Commonly known as: MOBIC   montelukast 10 MG tablet Commonly known as: SINGULAIR   ondansetron 8 MG tablet Commonly known as: ZOFRAN   PATADAY OP   pseudoephedrine 120 MG 12 hr tablet Commonly known  as: SUDAFED   temozolomide 140 MG capsule Commonly known as: TEMODAR       TAKE these medications    glycopyrrolate 1 MG tablet Commonly known as: ROBINUL Take 1 tablet (1 mg total) by mouth every 4 (four) hours as needed (excessive secretions).        Follow-up Information     [name], FNP Follow up in 1 week(s).   Specialty: Family Medicine Contact information: [address] [place] [address] [phone]                Allergies  Allergen Reactions   Cyanoacrylate Dermatitis    Dermabond Topical Skin Adhesive   Other Dermatitis    Bard Power Port   Wound Dressing Adhesive Itching and Rash    Dermabond    Codeine Nausea Only and Other (See Comments)    Severe constipation, also   Lentil Other (See Comments)    [place] Beans -  Allergy tested.    Consultations: Palliative care   Procedures/Studies: [place] CHEST (PLEURAL EFFUSION)  Result Date: 10/23/2022 CLINICAL DATA:  Lung carcinoma, pleural masses and potential component right-sided pleural fluid by imaging. Evaluation for possible thoracentesis. EXAM: CHEST ULTRASOUND COMPARISON:  CTA of the chest on 10/22/2022 FINDINGS: Extensive sonographic evaluation of the right chest demonstrates only a small component of actual discernible fluid and a large component of complex tissue likely reflecting tumor in the right pleural space and hemithorax. There is no window or pocket of approachable pleural fluid for thoracentesis. IMPRESSION: Ultrasound demonstrates only a small component of actual right pleural fluid and a large component of complex tissue likely reflecting tumor in the right pleural space and hemithorax. No thoracentesis was performed. Electronically Signed   By: Aletta Edouard M.D.   On: 10/23/2022 15:53   DG Chest Portable 1 View  Result Date: 10/23/2022 CLINICAL DATA:  Shortness of breath. EXAM: PORTABLE CHEST 1 VIEW COMPARISON:  October 16, 2022.  October 22, 2022. FINDINGS:  Stable cardiomediastinal silhouette. Right internal jugular Port-A-Cath is noted. Left lung is clear. Large rounded density is noted in the right lung most consistent with pleural effusion as noted on prior CT scan. Some degree of right basilar atelectasis is present as well. IMPRESSION: Large rounded density is again noted in the right lung most consistent with pleural effusion is noted on prior CT scan. Electronically Signed   By: Marijo Conception M.D.   On: 10/23/2022 09:43   CT Angio Chest Pulmonary Embolism (PE) W or WO Contrast  Result Date: 10/22/2022 CLINICAL DATA:  PE suspected, shortness of breath for 1 week, history of lung cancer * Tracking Code: BO * EXAM: CT ANGIOGRAPHY CHEST WITH CONTRAST TECHNIQUE: Multidetector CT imaging of the chest was performed using the standard protocol during bolus administration of intravenous contrast. Multiplanar CT image reconstructions and MIPs were obtained to evaluate the vascular anatomy. RADIATION DOSE REDUCTION: This exam was performed according to the departmental dose-optimization program which includes automated exposure control, adjustment of the mA and/or kV according to patient size and/or use of iterative reconstruction technique. CONTRAST:  25m OMNIPAQUE IOHEXOL 350 MG/ML SOLN COMPARISON:  09/09/2022 FINDINGS: Cardiovascular: Satisfactory opacification of the pulmonary arteries to the segmental level. No evidence of pulmonary embolism. Normal heart size. No pericardial effusion. Right chest port catheter. Mediastinum/Nodes: No enlarged mediastinal, hilar, or axillary lymph nodes. Thyroid gland, trachea, and esophagus demonstrate no significant findings. Lungs/Pleura: Large right pleural effusion, significantly increased in volume compared to prior examination. Multiple pleural masses and nodules, similar to prior examination, index mass in the right lung base measuring 5.0 x 3.6 cm (series 7, image 77). Some nodules may be slightly enlarged compared to  prior examination, for example anteriorly measuring 2.8 x 1.8 cm, previously 2.5 x 1.4 cm (series 7, image 27). Left lung is normally aerated. Upper Abdomen: No acute abnormality. Musculoskeletal: No chest wall abnormality. No acute osseous findings. Review of the MIP images confirms the above findings. IMPRESSION: 1. Negative examination for pulmonary embolism. 2. Large right pleural effusion, significantly increased in volume compared to prior examination and possibly symptomatic due to large size. 3. Multiple pleural masses and nodules, similar to prior examination, although some smaller nodules may be slightly enlarged compared to prior examination. 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 These results will be called to the ordering clinician or representative by the Radiologist Assistant, and communication documented in the PACS or [employer] Electronically Signed   By:  Delanna Ahmadi M.D.   On: 10/22/2022 15:13   [place] CHEST (PLEURAL EFFUSION)  Result Date: 10/21/2022 CLINICAL DATA:  Large partially loculated effusion. 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 IR thoracentesis, 09/13/2022. Chest XR, 10/16/2022. FINDINGS: Informed written consent was obtained from the intubation after a discussion of the risks, benefits and alternatives to treatment. A timeout was performed prior to the initiation of the procedure. Initial ultrasound scanning demonstrates a no significant pleural effusion within either the RIGHT or LEFT chest. Planned thoracentesis was aborted. IMPRESSION: No sonographic evidence of a significant pleural effusion within the RIGHT or LEFT chest. Thoracentesis was not performed Electronically Signed   By: Michaelle Birks M.D.   On: 10/21/2022 14:45   DG Chest 2 View  Result Date: 10/18/2022 CLINICAL DATA:  SOB, cough EXAM: CHEST - 2 VIEW COMPARISON:  09/13/2022 FINDINGS:  Unremarkable cardiac silhouette. There is no pneumothorax. Large loculated right-sided pleural effusion has increased in size when compared to the prior study. Left lung clear. Port-A-Cath tip overlies distal SVC. Surgical clips overlie the lower right hemithorax medially. IMPRESSION: Right-sided pleural effusion with a large loculated component that has increased in size. Electronically Signed   By: Sammie Bench M.D.   On: 10/18/2022 09:43    Subjective: Patient was seen and examined at bedside.   Patient feels better, she is being discharged to [facility] in [place].  Discharge Exam: Vitals:   10/27/22 1320 10/28/22 0452  BP: (!) 162/95 138/80  Pulse: (!) 108 98  Resp:  16  Temp: 98.6 F (37 C) 98 F (36.7 C)  SpO2: 98% 96%   Vitals:   10/26/22 0518 10/27/22 0423 10/27/22 1320 10/28/22 0452  BP: (!) 146/91 (!) 156/96 (!) 162/95 138/80  Pulse: 100 (!) 104 (!) 108 98  Resp: '16 16  16  '$ Temp: 98.2 F (36.8 C) 98.3 F (36.8 C) 98.6 F (37 C) 98 F (36.7 C)  TempSrc: Oral Oral Oral Oral  SpO2: 99% 96% 98% 96%  Weight:      Height:        General: Pt is alert, awake, not in acute distress Cardiovascular: RRR, S1/S2 +, no rubs, no gallops Respiratory: CTA bilaterally, no wheezing, no rhonchi Abdominal: Soft, NT, ND, bowel sounds + Extremities: no edema, no cyanosis    The results of significant diagnostics from this hospitalization (including imaging, microbiology, ancillary and laboratory) are listed below for reference.     Microbiology: No results found for this or any previous visit (from the past 240 hour(s)).   Labs: BNP (last 3 results) No results for input(s): "BNP" in the last 8760 hours. Basic Metabolic Panel: Recent Labs  Lab 10/23/22 0951 10/24/22 0500 10/25/22 0441  NA 134* 131* 128*  K 3.4* 3.2* 4.1  CL 100 98 94*  CO2 '26 26 24  '$ GLUCOSE 95 99 136*  BUN 8 6 5*  CREATININE 0.56 0.45 0.40*  CALCIUM 8.3* 8.1* 8.6*  MG  --   --   2.1  PHOS  --   --  3.3   Liver Function Tests: No results for input(s): "AST", "ALT", "ALKPHOS", "BILITOT", "PROT", "ALBUMIN" in the last 168 hours. No results for input(s): "LIPASE", "AMYLASE" in the last 168 hours. No results for input(s): "AMMONIA" in the last 168 hours. CBC: Recent Labs  Lab 10/23/22 0951 10/24/22 0500 10/25/22 0441  WBC 7.2 7.9 8.0  NEUTROABS 6.1  --   --  HGB 11.3* 11.1* 11.6*  HCT 34.2* 34.0* 35.0*  MCV 98.6 98.6 95.9  PLT 129* 126* 135*   Cardiac Enzymes: No results for input(s): "CKTOTAL", "CKMB", "CKMBINDEX", "TROPONINI" in the last 168 hours. BNP: Invalid input(s): "POCBNP" CBG: No results for input(s): "GLUCAP" in the last 168 hours. D-Dimer No results for input(s): "DDIMER" in the last 72 hours. Hgb A1c No results for input(s): "HGBA1C" in the last 72 hours. Lipid Profile No results for input(s): "CHOL", "HDL", "LDLCALC", "TRIG", "CHOLHDL", "LDLDIRECT" in the last 72 hours. Thyroid function studies No results for input(s): "TSH", "T4TOTAL", "T3FREE", "THYROIDAB" in the last 72 hours.  Invalid input(s): "FREET3" Anemia work up No results for input(s): "VITAMINB12", "FOLATE", "FERRITIN", "TIBC", "IRON", "RETICCTPCT" in the last 72 hours. Urinalysis    Component Value Date/Time   COLORURINE YELLOW 12/19/2021 1620   APPEARANCEUR CLEAR 12/19/2021 1620   LABSPEC <1.005 (L) 12/19/2021 1620   PHURINE 6.0 12/19/2021 1620   GLUCOSEU NEGATIVE 12/19/2021 1620   HGBUR NEGATIVE 12/19/2021 1620   BILIRUBINUR NEGATIVE 12/19/2021 1620   KETONESUR NEGATIVE 12/19/2021 1620   PROTEINUR NEGATIVE 10/14/2022 0948   NITRITE NEGATIVE 12/19/2021 1620   LEUKOCYTESUR NEGATIVE 12/19/2021 1620   Sepsis Labs Recent Labs  Lab 10/23/22 0951 10/24/22 0500 10/25/22 0441  WBC 7.2 7.9 8.0   Microbiology No results found for this or any previous visit (from the past 240 hour(s)).   Time coordinating discharge: Over 30 minutes  SIGNED:   Shawna Clamp,  MD  [facility] 10/28/2022, 1:59 PM Pager   If 7PM-7AM, please contact night-coverage

## 2022-10-28 NOTE — Progress Notes (Signed)
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  She has cancer around the right lung, taking Avastin and Temodar.  Patient went to the ER 1 month ago for shortness of breath and she had a thoracocentesis with removal of 150 cc.  She has been mildly short of breath since that thoracocentesis.  Last week she had a chest x-ray still showed pleural effusion.  She was scheduled for thoracocentesis on 2/26 but unable to drain the fluid.  Her breathing has gotten worse particularly with orthopnea.  She has called Dr. Julien Nordmann, had a CT chest in the afternoon with larger effusion or something ongoing symptoms. She presented in the ED again.  She has sarcoma specialist at [facility].  It was initially resected, She went without treatment for 18 months or so then she had a diaphragmatic mass , had radiation with stabilization and then developed small tumors in the pleural cavity, treated with chemo.  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 Julien Nordmann.  Seen by IR on 2/26, no drainable pocket.  CTA negative for PE, has large right pleural effusion.  Patient admitted for further evaluation. Goals of care discussion completed with oncologist, herself,  patient's sister and palliative care.  Care transitioned to full comfort care.  Patient awaiting discharge to hospice home.   Assessment & Plan:   Principal Problem:   Pleural effusion Active Problems:   Need for emotional support   High risk medication use   Shortness of breath   Cancer associated pain   Anxiety   Counseling and coordination of care   Goals of care, counseling/discussion   Palliative care encounter   Insomnia   Dyspnea on exertion   Medication management   Sarcomatoid lung cancer: -Patient with  worsening SOB in the setting of known sarcomatoid lung CA -She was found to have a large pleural effusion but this was not a drainable collection earlier this week -She had a CT with again concern for worsening effusion and she was sent in by oncology for consideration of drainage.  -Patient was again evaluated by IR.  Findings are more consistent with evolving mass in the right chest not effusion.  No thoracocentesis attempted.  Ultrasound again demonstrated large tumor burden in the right chest -She was admitted to telemetry -Unfortunately, findings appear consistent more with evolving mass in the R chest rather than effusion -As per Dr. Julien Nordmann unfortunately the disease has progressed significantly last few weeks.  He will reach out to Dr. Dawna Part to see if he has any other suggestions.  If no other options for management of her condition he would recommend palliative care and hospice referral. -It is especially unfortunate that this is evolving despite active treatment with Avastin and Temodar -She reports having already had radiation and believes she is not able to be treated again with radiation -Symptomatic control with O2 for now. -Goals of care discussion completed with oncologist, herself,  patient's sister and palliative care.  Care transitioned to full comfort care. Patient awaiting discharge to hospice home, Patient prefers [facility] [place] [place] [facility] care.   Elevated BP -No known diagnosis of HTN -This was also noted recently at her 2/19 oncology visit.     DVT prophylaxis: SCDs Code Status:Full code  Family Communication: Sister at bed side. Disposition Plan:   Admitted for Sarcoma of the lung with pleural effusion.  Oncology and IR is consulted. Goals of care discussion completed with oncologist, herself,  patient's sister and palliative care.  Care transitioned to full comfort care.  Consultants:  IR Oncology  Procedures:  Antimicrobials:  Anti-infectives  (From admission, onward)    None       Subjective: Patient was seen and examined at bedside. Overnight events noted. Patient reports feeling fine still has cough.  She remains on 2 L of supplemental oxygen. She knows that she has advanced disease. Patient wants to transition her care to full comfort care and hospice.   Objective: Vitals:   10/26/22 0518 10/27/22 0423 10/27/22 1320 10/28/22 0452  BP: (!) 146/91 (!) 156/96 (!) 162/95 138/80  Pulse: 100 (!) 104 (!) 108 98  Resp: '16 16  16  '$ Temp: 98.2 F (36.8 C) 98.3 F (36.8 C) 98.6 F (37 C) 98 F (36.7 C)  TempSrc: Oral Oral Oral Oral  SpO2: 99% 96% 98% 96%  Weight:      Height:        Intake/Output Summary (Last 24 hours) at 10/28/2022 1340 Last data filed at 10/28/2022 1036 Gross per 24 hour  Intake 240 ml  Output 400 ml  Net -160 ml   Filed Weights   10/23/22 0906  Weight: 68 kg    Examination:  General exam: Appears comfortable, chronically ill looking, deconditioned, not in any distress. Respiratory system: CTA bilaterally, respiratory effort normal, RR 14 Cardiovascular system: S1 & S2 heard, regular rate and rhythm, no murmur. Gastrointestinal system: Abdomen is soft, non tender, non distended, BS+ Central nervous system: Alert and oriented x 3. No focal neurological deficits. Extremities: No edema, no cyanosis, no clubbing Skin: No rashes, lesions or ulcers Psychiatry: Judgement and insight appear normal. Mood & affect appropriate.     Data Reviewed: I have personally reviewed following labs and imaging studies  CBC: Recent Labs  Lab 10/23/22 0951 10/24/22 0500 10/25/22 0441  WBC 7.2 7.9 8.0  NEUTROABS 6.1  --   --   HGB 11.3* 11.1* 11.6*  HCT 34.2* 34.0* 35.0*  MCV 98.6 98.6 95.9  PLT 129* 126* [id number]*   Basic Metabolic Panel: Recent Labs  Lab 10/23/22 0951 10/24/22 0500 10/25/22 0441  NA 134* 131* 128*  K 3.4* 3.2* 4.1  CL 100 98 94*  CO2 '26 26 24  '$ GLUCOSE 95 99 136*  BUN 8 6 5*   CREATININE 0.56 0.45 0.40*  CALCIUM 8.3* 8.1* 8.6*  MG  --   --  2.1  PHOS  --   --  3.3   GFR: Estimated Creatinine Clearance: 70.9 mL/min (A) (by C-G formula based on SCr of 0.4 mg/dL (L)). Liver Function Tests: No results for input(s): "AST", "ALT", "ALKPHOS", "BILITOT", "PROT", "ALBUMIN" in the last 168 hours. No results for input(s): "LIPASE", "AMYLASE" in the last 168 hours. No results for input(s): "AMMONIA" in the last 168 hours. Coagulation Profile: No results for input(s): "INR", "PROTIME" in the last 168 hours. Cardiac Enzymes: No results for input(s): "CKTOTAL", "CKMB", "CKMBINDEX", "TROPONINI" in the last 168 hours. BNP (last 3 results) No results for input(s): "PROBNP" in the last 8760 hours. HbA1C: No results for input(s): "HGBA1C" in the last 72 hours. CBG: No results for input(s): "GLUCAP" in the last 168 hours. Lipid Profile: No results for input(s): "CHOL", "HDL", "LDLCALC", "TRIG", "CHOLHDL", "LDLDIRECT" in the last 72 hours. Thyroid Function  Tests: No results for input(s): "TSH", "T4TOTAL", "FREET4", "T3FREE", "THYROIDAB" in the last 72 hours. Anemia Panel: No results for input(s): "VITAMINB12", "FOLATE", "FERRITIN", "TIBC", "IRON", "RETICCTPCT" in the last 72 hours. Sepsis Labs: No results for input(s): "PROCALCITON", "LATICACIDVEN" in the last 168 hours.  No results found for this or any previous visit (from the past 240 hour(s)).  Radiology Studies: No results found.  Scheduled Meds:  Chlorhexidine Gluconate Cloth  6 each Topical Q0600   clonazepam  0.25 mg Oral Q2200   methylPREDNISolone (SOLU-MEDROL) injection  125 mg Intravenous Daily   montelukast  10 mg Oral QPC supper   morphine  15 mg Oral Q24H   morphine  2 mg Oral 3 times per day   senna  2 tablet Oral BID   sodium chloride flush  3 mL Intravenous Q12H   Continuous Infusions:     LOS: 5 days    Time spent: 35 mins    Kielee Care, MD [facility]   If 7PM-7AM, please  contact night-coverage

## 2022-10-28 NOTE — Progress Notes (Signed)
Daily Progress Note   Patient Name: Brenda Patel       Date: 10/28/2022 DOB: 1960-11-19  Age: 62 y.o. MRN#: [id number] Attending Physician: Shawna Clamp, MD Primary Care Physician: [name], FNP Admit Date: 10/23/2022 Length of Stay: 5 days  Reason for Consultation/Follow-up: Terminal Care  Subjective:   CC: Continuing with symptom management at the end of life. Following up regarding symptom management and goals of care.   Subjective:  Reviewed EMR prior to seeing patient.  At time of EMR review, patient had received scheduled morphine solution 2 mg x 4 doses.  Patient continues to receive 0.125 oral clonazepam at bedtime.  Presented to bedside to see patient.  Patient welcoming provider to visit.  Patient able to update regarding her symptoms at this time.  Patient does feel that scheduled morphine 2 mg every 4 hours is assisting with her pain management.  Patient did note that she refused the overnight dose as she was hoping to get some sleep though because she misses dose, she woke up in pain in her back.  We discussed adjustment of opioid medication today.  Will start MS Contin 15 mg nightly 2 hours prior to clonazepam dosing.  Will continued morphine solution 2 mg every 4 hours scheduled during the day.  Discussed plan that should patient tolerate MS Contin tonight, would consider making twice daily moving forward.  Patient agreeing with this plan.  Patient noted passing gas at this time and small bowel movement.  Patient does feel she is going to have a more substantial bowel movement today.  Again discussed importance of bowel movements while on opioids.  Patient is hoping to hear further information regarding acceptance to inpatient hospice in [facility].  Noted would reach out to liaison and hope for updates.  Emotional support provided via active listening.  During visit patient's dog (her therapy dog) was able to visit.  Allow them time to enjoy company.  All questions  were answered at that time.  Noted palliative medicine team would continue to follow along to assist in care.  Explained provider Dr. Rowe Pavy would be taking over palliative medicine service tomorrow.  In conversations with hospice liaison throughout the day, informed patient has been accepted to inpatient hospice in [facility].  Able to go to bedside to date patient regarding this and plan to transfer today.  Discussed continued symptom management.  Discussed using as needed morphine solution at least 30 minutes prior to transfer to allow for more appropriate management of shortness of breath during ride to facility.  Spent time providing emotional support.  Thanked patient for allowing this provider to visit with her during her time here.  Review of Systems Cough, shortness of breath, constipation Objective:   Vital Signs:  BP 138/80 (BP Location: Left Arm)   Pulse 98   Temp 98 F (36.7 C) (Oral)   Resp 16   Ht '5\' 6"'$  (1.676 m)   Wt 68 kg   SpO2 96%   BMI 24.21 kg/m   Physical Exam: General: NAD, alert, pleasant Eyes: conjunctiva clear, anicteric sclera HENT: normocephalic, atraumatic, moist mucous membranes Cardiovascular: RRR, no edema in LE b/l Respiratory: slightly increased work of breathing noted, not in respiratory distress, cough present  Abdomen: not distended Skin: no rashes or lesions on visible skin Neuro: A&Ox4, following commands easily Psych: appropriately answers all questions  Imaging:  I personally reviewed recent imaging.   Assessment & Plan:   Assessment: Patient is a 62 year old female  with a past medical history of sarcoid tumor of the right lung diagnosed originally in 2019 s/p surgical removal and chemotherapy who was admitted on 10/23/2022 for management of shortness of breath. Mass was initially resected, she went without treatment for 18 months or so then she had a diaphragmatic mass , had radiation with stabilization and then developed small tumors in  the pleural cavity, treated with chemo. For cancer management, patient has been following up with Dr. Earlie Server and a sarcoma specialist at [facility].  Imaging on admission has shown large right pleural effusion.  Dr. Earlie Server already met with patient and discussed care.  Patient does not want to pursue any further cancer directed therapies. Palliative medicine consulted to assist with complex medical decision making and symptom management.   Recommendations/Plan: # Complex medical decision making/goals of care:     -Patient transitioned to comfort focused care on 10/25/22 after extensive conversation.                 -Patient has completed HCPOA paperwork.  If patient is unable to make medical decisions for herself, her primary medical decision maker is her Sister Deonta Haverty.  Patient's alternate decision-maker if Rise Paganini is not available is her sister Lucrezia Starch  -Patient is seeking inpatient hospice placement.  Primary choice is through hospice of not with their facility in [facility].  Patient is willing to consider other facilities if not excepted such as a facility in [place].  Should patient not be able to be excepted to an inpatient hospice, would then need to consider long-term care with hospice such as at [facility].  Patient stating strong desire to go to inpatient hospice for continued aggressive symptom management knowing her symptoms are going to worsen, most prominently shortness of breath with her rapidly growing lung mass.               Code Status: DNR  # Symptom management: -Pain/Dyspnea, acute in the setting of end-of-life care Worsening cough and shortness of breath today. -Start MS Contin '15mg'$  qhs tonight.  -Continue scheduled morphine solution 2 mg oral solution every 4 hours during the day.  -Continue morphine solution '1mg'$  q4hrs prn. Continue IV morphine 1-'2mg'$  q69mns prn breakthrough dyspnea or pain.  Continue to adjust based on patient's symptom burden.               -Solu-Medrol 125 mg to once daily q am.  Discussed this care plan with pharmacist to assist with dosing schedule. Should patient tolerate MS Contin '15mg'$  tonight, would then consider '15mg'$  BID dosing. Hope if that with up titration, q4hr scheduled dosing with be able to be transitioned back to prn.  Anxiety/agitation, in the setting of end-of-life care -Continue Ativan 0.5 mg PO/IV every 4 hours as needed. Will monitor and adjust based on patient's symptom burden.   -Continue scheduled oral clonazepam 0.25 PO q hs.   Secretions-in the setting of end-of-life -Glycopyrrolate as needed.   Constipation Needs bowel regimen while on opioids.  -Continue Senna 2 tabs BID. Continue to increase if needed (up to 8 tablets in 24 hours).   -Does not want to take MiraLAX.  # Psychosocial Support: - Support System: sisters (Rise Paganiniis primary contact/HCPOA), dog DDamita Dunnings            -[facility] booklets provided to patient and her sister on 3/2  # Discharge Planning: Awaiting inpatient hospice evaluation; [facility] is aware.   -  Personally messaged liaison today, no bed noted on 3/4 at time of conversation.  Discussed with: patient, TOC  Thank you for allowing the palliative care team to participate in the care Inocencio Homes.  Chelsea Aus, DO Palliative Care Provider PMT # [phone]  If patient remains symptomatic despite maximum doses, please call PMT at [phone] between 0700 and 1900. Outside of these hours, please call attending, as PMT does not have night coverage.  *Please note that this is a verbal dictation therefore any spelling or grammatical errors are due to the "[employer]" system interpretation.   UPDATE: PATIENT BEING DISCHARGED TO INPATIENT HOSPICE IN [place] TODAY.  This provider spent a total of 52 minutes providing patient's care.  Includes review of EMR, discussing care with other staff members  involved in patient's medical care, obtaining relevant history and information from patient and/or patient's family, and personal review of imaging and lab work. Greater than 50% of the time was spent counseling and coordinating care related to the above assessment and plan.

## 2022-10-29 ENCOUNTER — Inpatient Hospital Stay: Payer: BC Managed Care – PPO

## 2022-10-29 ENCOUNTER — Inpatient Hospital Stay: Payer: BC Managed Care – PPO | Admitting: Internal Medicine

## 2022-11-07 ENCOUNTER — Other Ambulatory Visit (HOSPITAL_COMMUNITY): Payer: Self-pay

## 2022-11-12 ENCOUNTER — Other Ambulatory Visit: Payer: BC Managed Care – PPO

## 2022-11-12 ENCOUNTER — Ambulatory Visit: Payer: BC Managed Care – PPO

## 2022-11-12 ENCOUNTER — Ambulatory Visit: Payer: BC Managed Care – PPO | Admitting: Internal Medicine

## 2022-11-25 DEATH — deceased

## 2022-11-26 ENCOUNTER — Ambulatory Visit: Payer: BC Managed Care – PPO

## 2022-11-26 ENCOUNTER — Other Ambulatory Visit: Payer: BC Managed Care – PPO

## 2022-12-09 ENCOUNTER — Ambulatory Visit: Payer: BC Managed Care – PPO

## 2022-12-09 ENCOUNTER — Ambulatory Visit: Payer: BC Managed Care – PPO | Admitting: Internal Medicine

## 2022-12-09 ENCOUNTER — Other Ambulatory Visit: Payer: BC Managed Care – PPO

## 2023-02-22 ENCOUNTER — Other Ambulatory Visit (HOSPITAL_BASED_OUTPATIENT_CLINIC_OR_DEPARTMENT_OTHER): Payer: Self-pay
